# Patient Record
Sex: Female | Born: 1935 | Race: Black or African American | Hispanic: No | Marital: Single | Smoking: Former smoker
Health system: Southern US, Community
[De-identification: ages and names within clinical notes are randomized; demographics above are authoritative.]

## PROBLEM LIST (undated history)

## (undated) DIAGNOSIS — F0390 Unspecified dementia without behavioral disturbance: Secondary | ICD-10-CM

## (undated) DIAGNOSIS — E785 Hyperlipidemia, unspecified: Secondary | ICD-10-CM

## (undated) DIAGNOSIS — C801 Malignant (primary) neoplasm, unspecified: Secondary | ICD-10-CM

## (undated) DIAGNOSIS — K219 Gastro-esophageal reflux disease without esophagitis: Secondary | ICD-10-CM

## (undated) DIAGNOSIS — F329 Major depressive disorder, single episode, unspecified: Secondary | ICD-10-CM

## (undated) DIAGNOSIS — I10 Essential (primary) hypertension: Secondary | ICD-10-CM

## (undated) DIAGNOSIS — I639 Cerebral infarction, unspecified: Secondary | ICD-10-CM

## (undated) DIAGNOSIS — F32A Depression, unspecified: Secondary | ICD-10-CM

## (undated) HISTORY — DX: Gastro-esophageal reflux disease without esophagitis: K21.9

## (undated) HISTORY — DX: Essential (primary) hypertension: I10

## (undated) HISTORY — DX: Depression, unspecified: F32.A

## (undated) HISTORY — DX: Major depressive disorder, single episode, unspecified: F32.9

## (undated) HISTORY — DX: Hyperlipidemia, unspecified: E78.5

---

## 2012-01-03 ENCOUNTER — Inpatient Hospital Stay (HOSPITAL_COMMUNITY)
Admission: EM | Admit: 2012-01-03 | Discharge: 2012-01-12 | DRG: 061 | Disposition: A | Discharge status: Skilled Nursing Facility | Payer: Medicare Other | Attending: Internal Medicine | Admitting: Internal Medicine

## 2012-01-03 ENCOUNTER — Emergency Department (HOSPITAL_COMMUNITY): Payer: Medicare Other

## 2012-01-03 DIAGNOSIS — R29810 Facial weakness: Secondary | ICD-10-CM | POA: Diagnosis present

## 2012-01-03 DIAGNOSIS — G8190 Hemiplegia, unspecified affecting unspecified side: Secondary | ICD-10-CM | POA: Diagnosis present

## 2012-01-03 DIAGNOSIS — S01501A Unspecified open wound of lip, initial encounter: Secondary | ICD-10-CM | POA: Diagnosis present

## 2012-01-03 DIAGNOSIS — Z79899 Other long term (current) drug therapy: Secondary | ICD-10-CM

## 2012-01-03 DIAGNOSIS — F0390 Unspecified dementia without behavioral disturbance: Secondary | ICD-10-CM | POA: Diagnosis present

## 2012-01-03 DIAGNOSIS — D72829 Elevated white blood cell count, unspecified: Secondary | ICD-10-CM | POA: Diagnosis not present

## 2012-01-03 DIAGNOSIS — I639 Cerebral infarction, unspecified: Secondary | ICD-10-CM | POA: Diagnosis present

## 2012-01-03 DIAGNOSIS — Z860100 Personal history of colon polyps, unspecified: Secondary | ICD-10-CM

## 2012-01-03 DIAGNOSIS — Z66 Do not resuscitate: Secondary | ICD-10-CM | POA: Diagnosis present

## 2012-01-03 DIAGNOSIS — R220 Localized swelling, mass and lump, head: Secondary | ICD-10-CM

## 2012-01-03 DIAGNOSIS — I6350 Cerebral infarction due to unspecified occlusion or stenosis of unspecified cerebral artery: Principal | ICD-10-CM | POA: Diagnosis present

## 2012-01-03 DIAGNOSIS — E119 Type 2 diabetes mellitus without complications: Secondary | ICD-10-CM | POA: Diagnosis present

## 2012-01-03 DIAGNOSIS — Z7982 Long term (current) use of aspirin: Secondary | ICD-10-CM

## 2012-01-03 DIAGNOSIS — Z8673 Personal history of transient ischemic attack (TIA), and cerebral infarction without residual deficits: Secondary | ICD-10-CM

## 2012-01-03 DIAGNOSIS — I10 Essential (primary) hypertension: Secondary | ICD-10-CM | POA: Diagnosis present

## 2012-01-03 DIAGNOSIS — N179 Acute kidney failure, unspecified: Secondary | ICD-10-CM | POA: Diagnosis present

## 2012-01-03 DIAGNOSIS — E785 Hyperlipidemia, unspecified: Secondary | ICD-10-CM | POA: Diagnosis present

## 2012-01-03 DIAGNOSIS — R1310 Dysphagia, unspecified: Secondary | ICD-10-CM | POA: Diagnosis present

## 2012-01-03 DIAGNOSIS — E876 Hypokalemia: Secondary | ICD-10-CM | POA: Diagnosis present

## 2012-01-03 DIAGNOSIS — Z7902 Long term (current) use of antithrombotics/antiplatelets: Secondary | ICD-10-CM

## 2012-01-03 DIAGNOSIS — D649 Anemia, unspecified: Secondary | ICD-10-CM | POA: Diagnosis not present

## 2012-01-03 DIAGNOSIS — Z87891 Personal history of nicotine dependence: Secondary | ICD-10-CM

## 2012-01-03 DIAGNOSIS — D62 Acute posthemorrhagic anemia: Secondary | ICD-10-CM | POA: Diagnosis present

## 2012-01-03 DIAGNOSIS — W19XXXA Unspecified fall, initial encounter: Secondary | ICD-10-CM | POA: Diagnosis present

## 2012-01-03 DIAGNOSIS — R4182 Altered mental status, unspecified: Secondary | ICD-10-CM

## 2012-01-03 DIAGNOSIS — E43 Unspecified severe protein-calorie malnutrition: Secondary | ICD-10-CM | POA: Diagnosis present

## 2012-01-03 HISTORY — DX: Unspecified dementia, unspecified severity, without behavioral disturbance, psychotic disturbance, mood disturbance, and anxiety: F03.90

## 2012-01-03 HISTORY — DX: Cerebral infarction, unspecified: I63.9

## 2012-01-03 HISTORY — DX: Malignant (primary) neoplasm, unspecified: C80.1

## 2012-01-03 LAB — URINALYSIS, ROUTINE W REFLEX MICROSCOPIC
Glucose, UA: NEGATIVE mg/dL
Leukocytes, UA: NEGATIVE
Specific Gravity, Urine: 1.022 (ref 1.005–1.030)
pH: 5.5 (ref 5.0–8.0)

## 2012-01-03 LAB — COMPREHENSIVE METABOLIC PANEL
Albumin: 2.5 g/dL — ABNORMAL LOW (ref 3.5–5.2)
Alkaline Phosphatase: 83 U/L (ref 39–117)
BUN: 25 mg/dL — ABNORMAL HIGH (ref 6–23)
Creatinine, Ser: 1.21 mg/dL — ABNORMAL HIGH (ref 0.50–1.10)
Potassium: 2.5 mEq/L — CL (ref 3.5–5.1)
Total Protein: 6 g/dL (ref 6.0–8.3)

## 2012-01-03 LAB — CBC
HCT: 29.4 % — ABNORMAL LOW (ref 36.0–46.0)
MCHC: 33.3 g/dL (ref 30.0–36.0)
RDW: 13.6 % (ref 11.5–15.5)

## 2012-01-03 LAB — URINE MICROSCOPIC-ADD ON

## 2012-01-03 LAB — GLUCOSE, CAPILLARY: Glucose-Capillary: 227 mg/dL — ABNORMAL HIGH (ref 70–99)

## 2012-01-03 MED ORDER — SODIUM CHLORIDE 0.9 % IV SOLN: INTRAVENOUS | Status: DC

## 2012-01-03 MED ORDER — FENTANYL CITRATE 0.05 MG/ML IJ SOLN
25.0000 ug | Freq: Once | INTRAMUSCULAR | Status: DC
  Filled 2012-01-03: qty 2

## 2012-01-03 MED ORDER — ONDANSETRON HCL 4 MG/2ML IJ SOLN: 4.0000 mg | Freq: Three times a day (TID) | INTRAMUSCULAR | Status: DC | PRN

## 2012-01-03 MED ORDER — POTASSIUM CHLORIDE CRYS ER 20 MEQ PO TBCR: 40.0000 meq | EXTENDED_RELEASE_TABLET | Freq: Once | ORAL | Status: DC

## 2012-01-03 MED ORDER — POTASSIUM CHLORIDE 10 MEQ/100ML IV SOLN
10.0000 meq | INTRAVENOUS | Status: AC
  Administered 2012-01-03 – 2012-01-04 (×3): 10 meq via INTRAVENOUS
  Filled 2012-01-03 (×3): qty 100

## 2012-01-03 NOTE — ED Provider Notes (Signed)
History     CSN: [id number]  Arrival date & time 01/03/12  1953   First MD Initiated Contact with Patient 01/03/12 2014      Chief Complaint  Patient presents with  . Fall    (Consider location/radiation/quality/duration/timing/severity/associated sxs/prior treatment) Patient is a 78 y.o. female presenting with fall. The history is provided by the patient, the EMS personnel and a relative.  Fall Incident onset: unknown. The fall occurred in unknown circumstances. She fell from an unknown height. She landed on a hard floor. The volume of blood lost was minimal. The point of impact was the head. The pain is present in the head. She was not ambulatory at the scene. There was entrapment after the fall. Pertinent negatives include no fever, no abdominal pain, no bowel incontinence, no nausea, no vomiting and no headaches. Treatment on scene includes a c-collar and a backboard. She has tried nothing for the symptoms. The treatment provided no relief.    No past medical history on file.  No past surgical history on file.  No family history on file.  History  Substance Use Topics  . Smoking status: Not on file  . Smokeless tobacco: Not on file  . Alcohol Use: Not on file    OB History    No data available      Review of Systems  Constitutional: Negative for fever.  HENT: Negative for neck pain.   Respiratory: Negative for cough and shortness of breath.   Cardiovascular: Negative for chest pain.  Gastrointestinal: Negative for nausea, vomiting, abdominal pain, diarrhea and bowel incontinence.  Neurological: Negative for headaches.  All other systems reviewed and are negative.    Allergies  Review of patient's allergies indicates no known allergies.  Home Medications   Current Outpatient Rx  Name Route Sig Dispense Refill  . ACETAMINOPHEN 325 MG PO TABS Oral Take 650 mg by mouth every 4 (four) hours as needed. For pain    . ASPIRIN EC 325 MG PO TBEC Oral Take 325 mg by  mouth daily.    . ATORVASTATIN CALCIUM 40 MG PO TABS Oral Take 40 mg by mouth daily.    Marland Kitchen CITALOPRAM HYDROBROMIDE 20 MG PO TABS Oral Take 20 mg by mouth every morning.    Marland Kitchen CLOPIDOGREL BISULFATE 75 MG PO TABS Oral Take 75 mg by mouth daily.    Marland Kitchen FERROUS SULFATE 325 (65 FE) MG PO TABS Oral Take 325 mg by mouth 2 (two) times daily.    Marland Kitchen HYDRALAZINE HCL 25 MG PO TABS Oral Take 25 mg by mouth 3 (three) times daily.    Marland Kitchen BACID PO TABS Oral Take 2 tablets by mouth every 12 (twelve) hours.    Marland Kitchen METOPROLOL TARTRATE 100 MG PO TABS Oral Take 100 mg by mouth daily.    Marland Kitchen OMEPRAZOLE 40 MG PO CPDR Oral Take 40 mg by mouth daily.    Marland Kitchen CALCIUM POLYCARBOPHIL 625 MG PO TABS Oral Take 625 mg by mouth 3 (three) times daily.    Marland Kitchen POLYETHYLENE GLYCOL 3350 PO PACK Oral Take 17 g by mouth every other day.    . QUETIAPINE FUMARATE 25 MG PO TABS Oral Take 75 mg by mouth at bedtime.    Marland Kitchen RIVASTIGMINE TARTRATE 1.5 MG PO CAPS Oral Take 1.5 mg by mouth 2 (two) times daily.    . SENNA 8.6 MG PO TABS Oral Take 1 tablet by mouth daily.      BP 180/89  Pulse 100  Temp 97.9  F (36.6 C) (Axillary)  Resp 21  SpO2 97%  Physical Exam  Nursing note and vitals reviewed. Constitutional: She is oriented to person, place, and time. She appears well-developed and well-nourished. No distress.  HENT:  Head: Normocephalic.       Laceration to left upper inside of lip, hemostatic  Eyes: EOM are normal. Pupils are equal, round, and reactive to light.  Neck: Normal range of motion.  Cardiovascular: Normal rate and normal heart sounds.   Pulmonary/Chest: Effort normal and breath sounds normal. No respiratory distress.  Abdominal: Soft. She exhibits no distension. There is no tenderness.  Musculoskeletal: Normal range of motion.  Neurological: She is alert and oriented to person, place, and time. No cranial nerve deficit. She exhibits normal muscle tone. Coordination normal.       Moving all extremities equally; follows commands on  both sides  Skin: Skin is warm and dry.    ED Course  Procedures (including critical care time)  Labs Reviewed  CBC - Abnormal; Notable for the following:    RBC 3.50 (*)     Hemoglobin 9.8 (*)     HCT 29.4 (*)     All other components within normal limits  COMPREHENSIVE METABOLIC PANEL - Abnormal; Notable for the following:    Potassium 2.5 (*)     Glucose, Bld 214 (*)     BUN 25 (*)     Creatinine, Ser 1.21 (*)     Albumin 2.5 (*)     Total Bilirubin 0.2 (*)     GFR calc non Af Amer 38 (*)     GFR calc Af Amer 44 (*)     All other components within normal limits  URINALYSIS, ROUTINE W REFLEX MICROSCOPIC - Abnormal; Notable for the following:    Ketones, ur 15 (*)     Protein, ur 30 (*)     All other components within normal limits  GLUCOSE, CAPILLARY - Abnormal; Notable for the following:    Glucose-Capillary 227 (*)     All other components within normal limits  URINE MICROSCOPIC-ADD ON - Abnormal; Notable for the following:    Bacteria, UA FEW (*)     Casts HYALINE CASTS (*)  GRANULAR CAST   All other components within normal limits  PROTIME-INR  PLATELET COUNT   Ct Head Wo Contrast  01/04/2012  *RADIOLOGY REPORT*  Clinical Data: Left facial droop  CT HEAD WITHOUT CONTRAST  Technique:  Contiguous axial images were obtained from the base of the skull through the vertex without contrast.  Comparison:   the previous day's study  Findings: Atherosclerotic and physiologic intracranial calcifications.  Left frontal scalp soft tissue swelling again noted.  Patient motion degrades many images requiring repeat portions of the scan. Diffuse parenchymal atrophy. Patchy areas of hypoattenuation in deep and periventricular white matter bilaterally. Negative for acute intracranial hemorrhage, mass lesion, acute infarction, midline shift, or mass-effect. Acute infarct may be inapparent on noncontrast CT. Ventricles and sulci symmetric. Bone windows demonstrate no focal lesion.  IMPRESSION:   1. Negative for bleed or other acute intracranial process.  2. Atrophy and nonspecific white matter changes.   Original Report Authenticated By: Osa Craver, M.D.    Ct Head Wo Contrast  01/03/2012  *RADIOLOGY REPORT*  Clinical Data:  Found down the, altered mental status, hypertension  CT HEAD WITHOUT CONTRAST CT MAXILLOFACIAL WITHOUT CONTRAST CT CERVICAL SPINE WITHOUT CONTRAST  Technique:  Multidetector CT imaging of the head, cervical spine, and maxillofacial  structures were performed using the standard protocol without intravenous contrast. Multiplanar CT image reconstructions of the cervical spine and maxillofacial structures were also generated.  Comparison:   None  CT HEAD  Findings: Right frontal scalp soft tissue swelling. Atherosclerotic and physiologic intracranial calcifications. Diffuse parenchymal atrophy. Patchy areas of hypoattenuation in deep and periventricular white matter bilaterally. Negative for acute intracranial hemorrhage, mass lesion, acute infarction, midline shift, or mass-effect. Acute infarct may be inapparent on noncontrast CT. Ventricles and sulci symmetric. Bone windows demonstrate no focal lesion.  IMPRESSION:  1. Negative for bleed or other acute intracranial process.  2. Atrophy and nonspecific white matter changes  CT MAXILLOFACIAL  Findings:  Layering debris in the right frontal sinus.  Remainder of paranasal sinuses are normally developed and well aerated. Nasal septum midline.  Temporomandibular joints seated bilaterally. Mandible intact.  Zygomatic arches intact.  Orbits and globes unremarkable.  Negative for fracture.  Left frontal scalp soft tissue swelling. The patient is edentulous.  IMPRESSION:  1.  Negative for fracture or other acute bony abnormality.  CT CERVICAL SPINE  Findings:   Normal alignment.  No prevertebral soft tissue swelling.  Facets seated.  There is mild narrowing of the C4-5, C5- 6, and C 7T1 interspaces.  Small end plate spurs are noted  at all levels C3-T4.  Degenerative changes at the atlantoaxial articulation.  Negative for fracture.  Bilateral calcified carotid bifurcation plaque.  Remainder visualized paraspinal soft tissues unremarkable.  IMPRESSION: 1.  Negative for fracture or other acute bony abnormality. 2.  Multilevel degenerative changes as enumerated above. 3.  Bilateral carotid bifurcation plaque.   Original Report Authenticated By: Osa Craver, M.D.    Ct Cervical Spine Wo Contrast  01/03/2012  *RADIOLOGY REPORT*  Clinical Data:  Found down the, altered mental status, hypertension  CT HEAD WITHOUT CONTRAST CT MAXILLOFACIAL WITHOUT CONTRAST CT CERVICAL SPINE WITHOUT CONTRAST  Technique:  Multidetector CT imaging of the head, cervical spine, and maxillofacial structures were performed using the standard protocol without intravenous contrast. Multiplanar CT image reconstructions of the cervical spine and maxillofacial structures were also generated.  Comparison:   None  CT HEAD  Findings: Right frontal scalp soft tissue swelling. Atherosclerotic and physiologic intracranial calcifications. Diffuse parenchymal atrophy. Patchy areas of hypoattenuation in deep and periventricular white matter bilaterally. Negative for acute intracranial hemorrhage, mass lesion, acute infarction, midline shift, or mass-effect. Acute infarct may be inapparent on noncontrast CT. Ventricles and sulci symmetric. Bone windows demonstrate no focal lesion.  IMPRESSION:  1. Negative for bleed or other acute intracranial process.  2. Atrophy and nonspecific white matter changes  CT MAXILLOFACIAL  Findings:  Layering debris in the right frontal sinus.  Remainder of paranasal sinuses are normally developed and well aerated. Nasal septum midline.  Temporomandibular joints seated bilaterally. Mandible intact.  Zygomatic arches intact.  Orbits and globes unremarkable.  Negative for fracture.  Left frontal scalp soft tissue swelling. The patient is edentulous.   IMPRESSION:  1.  Negative for fracture or other acute bony abnormality.  CT CERVICAL SPINE  Findings:   Normal alignment.  No prevertebral soft tissue swelling.  Facets seated.  There is mild narrowing of the C4-5, C5- 6, and C 7T1 interspaces.  Small end plate spurs are noted at all levels C3-T4.  Degenerative changes at the atlantoaxial articulation.  Negative for fracture.  Bilateral calcified carotid bifurcation plaque.  Remainder visualized paraspinal soft tissues unremarkable.  IMPRESSION: 1.  Negative for fracture or other acute bony abnormality.  2.  Multilevel degenerative changes as enumerated above. 3.  Bilateral carotid bifurcation plaque.   Original Report Authenticated By: Osa Craver, M.D.    Ct Maxillofacial Wo Cm  01/03/2012  *RADIOLOGY REPORT*  Clinical Data:  Found down the, altered mental status, hypertension  CT HEAD WITHOUT CONTRAST CT MAXILLOFACIAL WITHOUT CONTRAST CT CERVICAL SPINE WITHOUT CONTRAST  Technique:  Multidetector CT imaging of the head, cervical spine, and maxillofacial structures were performed using the standard protocol without intravenous contrast. Multiplanar CT image reconstructions of the cervical spine and maxillofacial structures were also generated.  Comparison:   None  CT HEAD  Findings: Right frontal scalp soft tissue swelling. Atherosclerotic and physiologic intracranial calcifications. Diffuse parenchymal atrophy. Patchy areas of hypoattenuation in deep and periventricular white matter bilaterally. Negative for acute intracranial hemorrhage, mass lesion, acute infarction, midline shift, or mass-effect. Acute infarct may be inapparent on noncontrast CT. Ventricles and sulci symmetric. Bone windows demonstrate no focal lesion.  IMPRESSION:  1. Negative for bleed or other acute intracranial process.  2. Atrophy and nonspecific white matter changes  CT MAXILLOFACIAL  Findings:  Layering debris in the right frontal sinus.  Remainder of paranasal sinuses are  normally developed and well aerated. Nasal septum midline.  Temporomandibular joints seated bilaterally. Mandible intact.  Zygomatic arches intact.  Orbits and globes unremarkable.  Negative for fracture.  Left frontal scalp soft tissue swelling. The patient is edentulous.  IMPRESSION:  1.  Negative for fracture or other acute bony abnormality.  CT CERVICAL SPINE  Findings:   Normal alignment.  No prevertebral soft tissue swelling.  Facets seated.  There is mild narrowing of the C4-5, C5- 6, and C 7T1 interspaces.  Small end plate spurs are noted at all levels C3-T4.  Degenerative changes at the atlantoaxial articulation.  Negative for fracture.  Bilateral calcified carotid bifurcation plaque.  Remainder visualized paraspinal soft tissues unremarkable.  IMPRESSION: 1.  Negative for fracture or other acute bony abnormality. 2.  Multilevel degenerative changes as enumerated above. 3.  Bilateral carotid bifurcation plaque.   Original Report Authenticated By: Thora Lance III, M.D.      1. Hypokalemia   2. Altered mental status       MDM  Pt found face down by wheelchair at nursing home. Patient just recently move into this home and staff does not know her baseline. Pt does not appear to be on coumadin. Pt with obvious facial trauma so will scan head, neck and face. Do not feel oral laceration needs to be repaired. Will also get labs as unknown history.   8:27 PM I spoke to Mrss. Derryl Harbor, the patient's daughter-in-law. Pt is DNR. She has been having "mini-strokes" the last several weeks that are similar to this presentation. As of Friday (yesterday) patient was pushing herself around in a wheelchair with her feet and talking normally. She also mentions that the patient has been having "hallucinations" of her son being a small child. This could actually be her dementia instead of hallucinations, but unsure. Will procede with current workup and family will come to ED.  Workup unremarkable but patient  still appears altered from baseline. Are slowing replacing potassium. Will admit to medicine.  12:00 PM Pt was being assessed by admitting doctor and was noted to have left side neglect and weakness. This was a change from initial assessment. Nurse last noted patient moving both sides of body at 10 PM. Code Stroke activated.  1:08 AM Neurology has decided to give tPA to  patient. Pt to be admitted to ICU afterward.  Daleen Bo, MD 01/04/12 [phone]

## 2012-01-03 NOTE — ED Notes (Signed)
Per EMS, pt was found faced down. It is unknown how long patient has been down. Dentures broke in mouth. Currently dried blood on face and in mouth. Mouth cleaned out. Altered mental status but pt has hx of dementia. Unknown if AMS is baseline.  EMS Vital Signs: SBP: 200 palpated, HR 120, Resp 16, CBG 170. No IV or medications given.

## 2012-01-04 ENCOUNTER — Observation Stay (HOSPITAL_COMMUNITY): Payer: Medicare Other

## 2012-01-04 ENCOUNTER — Encounter (HOSPITAL_COMMUNITY): Payer: Self-pay | Admitting: *Deleted

## 2012-01-04 ENCOUNTER — Inpatient Hospital Stay (HOSPITAL_COMMUNITY): Payer: Medicare Other

## 2012-01-04 DIAGNOSIS — N179 Acute kidney failure, unspecified: Secondary | ICD-10-CM

## 2012-01-04 DIAGNOSIS — I639 Cerebral infarction, unspecified: Secondary | ICD-10-CM | POA: Diagnosis present

## 2012-01-04 DIAGNOSIS — E876 Hypokalemia: Secondary | ICD-10-CM

## 2012-01-04 DIAGNOSIS — R4182 Altered mental status, unspecified: Secondary | ICD-10-CM

## 2012-01-04 DIAGNOSIS — I6350 Cerebral infarction due to unspecified occlusion or stenosis of unspecified cerebral artery: Principal | ICD-10-CM

## 2012-01-04 LAB — MRSA PCR SCREENING: MRSA by PCR: NEGATIVE

## 2012-01-04 LAB — CBC
Hemoglobin: 8.6 g/dL — ABNORMAL LOW (ref 12.0–15.0)
MCH: 27.1 pg (ref 26.0–34.0)
MCHC: 32.2 g/dL (ref 30.0–36.0)
Platelets: 236 10*3/uL (ref 150–400)
RBC: 3.17 MIL/uL — ABNORMAL LOW (ref 3.87–5.11)

## 2012-01-04 LAB — GLUCOSE, CAPILLARY: Glucose-Capillary: 130 mg/dL — ABNORMAL HIGH (ref 70–99)

## 2012-01-04 LAB — PROTIME-INR
INR: 1.13 (ref 0.00–1.49)
Prothrombin Time: 14.3 seconds (ref 11.6–15.2)

## 2012-01-04 LAB — BASIC METABOLIC PANEL
BUN: 23 mg/dL (ref 6–23)
Calcium: 9.2 mg/dL (ref 8.4–10.5)
Chloride: 111 mEq/L (ref 96–112)
Creatinine, Ser: 1.07 mg/dL (ref 0.50–1.10)
GFR calc Af Amer: 48 mL/min — ABNORMAL LOW (ref >90)
GFR calc Af Amer: 51 mL/min — ABNORMAL LOW (ref >90)
GFR calc non Af Amer: 41 mL/min — ABNORMAL LOW (ref >90)
Glucose, Bld: 190 mg/dL — ABNORMAL HIGH (ref 70–99)
Potassium: 3.1 mEq/L — ABNORMAL LOW (ref 3.5–5.1)
Sodium: 139 mEq/L (ref 135–145)

## 2012-01-04 LAB — PLATELET COUNT: Platelets: 264 10*3/uL (ref 150–400)

## 2012-01-04 LAB — LIPID PANEL
Cholesterol: 149 mg/dL (ref 0–200)
Triglycerides: 60 mg/dL (ref <150)
VLDL: 12 mg/dL (ref 0–40)

## 2012-01-04 MED ORDER — LABETALOL HCL 5 MG/ML IV SOLN
10.0000 mg | Freq: Once | INTRAVENOUS | Status: AC
  Administered 2012-01-04: 10 mg via INTRAVENOUS

## 2012-01-04 MED ORDER — SODIUM CHLORIDE 0.9 % IV SOLN
INTRAVENOUS | Status: DC
  Administered 2012-01-04: 04:00:00 via INTRAVENOUS

## 2012-01-04 MED ORDER — LABETALOL HCL 5 MG/ML IV SOLN
INTRAVENOUS | Status: AC
  Administered 2012-01-04: 10 mg via INTRAVENOUS
  Filled 2012-01-04: qty 4

## 2012-01-04 MED ORDER — ONDANSETRON HCL 4 MG/2ML IJ SOLN: 4.0000 mg | Freq: Four times a day (QID) | INTRAMUSCULAR | Status: DC | PRN

## 2012-01-04 MED ORDER — PANTOPRAZOLE SODIUM 40 MG IV SOLR
40.0000 mg | Freq: Every day | INTRAVENOUS | Status: DC
  Administered 2012-01-04 – 2012-01-07 (×4): 40 mg via INTRAVENOUS
  Filled 2012-01-04 (×6): qty 40

## 2012-01-04 MED ORDER — POTASSIUM CHLORIDE 10 MEQ/100ML IV SOLN
10.0000 meq | INTRAVENOUS | Status: AC
  Administered 2012-01-04 (×4): 10 meq via INTRAVENOUS
  Filled 2012-01-04 (×4): qty 100

## 2012-01-04 MED ORDER — BIOTENE DRY MOUTH MT LIQD
15.0000 mL | Freq: Two times a day (BID) | OROMUCOSAL | Status: DC
  Administered 2012-01-04 – 2012-01-12 (×16): 15 mL via OROMUCOSAL

## 2012-01-04 MED ORDER — ALTEPLASE (STROKE) FULL DOSE INFUSION
0.9000 mg/kg | Freq: Once | INTRAVENOUS | Status: AC
  Administered 2012-01-04: 54 mg via INTRAVENOUS
  Filled 2012-01-04: qty 54

## 2012-01-04 MED ORDER — LABETALOL HCL 5 MG/ML IV SOLN
10.0000 mg | INTRAVENOUS | Status: DC | PRN
  Administered 2012-01-05 (×2): 10 mg via INTRAVENOUS
  Filled 2012-01-04 (×3): qty 4

## 2012-01-04 MED ORDER — ACETAMINOPHEN 325 MG PO TABS: 650.0000 mg | ORAL_TABLET | ORAL | Status: DC | PRN

## 2012-01-04 MED ORDER — POTASSIUM CHLORIDE IN NACL 20-0.9 MEQ/L-% IV SOLN
INTRAVENOUS | Status: DC
  Administered 2012-01-04: 75 mL/h via INTRAVENOUS
  Administered 2012-01-04: 11:00:00 via INTRAVENOUS
  Filled 2012-01-04 (×3): qty 1000

## 2012-01-04 MED ORDER — CHLORHEXIDINE GLUCONATE 0.12 % MT SOLN
15.0000 mL | Freq: Two times a day (BID) | OROMUCOSAL | Status: DC
  Administered 2012-01-04 – 2012-01-06 (×4): 15 mL via OROMUCOSAL
  Filled 2012-01-04 (×8): qty 15

## 2012-01-04 MED ORDER — INSULIN ASPART 100 UNIT/ML ~~LOC~~ SOLN
1.0000 [IU] | SUBCUTANEOUS | Status: DC
  Administered 2012-01-04: 2 [IU] via SUBCUTANEOUS
  Administered 2012-01-04 – 2012-01-06 (×2): 1 [IU] via SUBCUTANEOUS

## 2012-01-04 MED ORDER — ACETAMINOPHEN 650 MG RE SUPP
650.0000 mg | RECTAL | Status: DC | PRN
  Administered 2012-01-06: 650 mg via RECTAL
  Filled 2012-01-04: qty 1

## 2012-01-04 MED ORDER — HALOPERIDOL LACTATE 5 MG/ML IJ SOLN
2.0000 mg | Freq: Four times a day (QID) | INTRAMUSCULAR | Status: DC | PRN
  Administered 2012-01-04: 2 mg via INTRAVENOUS
  Filled 2012-01-04: qty 1

## 2012-01-04 NOTE — ED Notes (Signed)
Consulting civil engineer and EDP notified of bleed. Dr Julian Reil stated that he is not over pt if pt is on tpa. Speaking with family.  Family is upset.  Neuro paged.

## 2012-01-04 NOTE — ED Notes (Addendum)
Critical care at bedside. Eye swollen shut. Hematoma over left eye and chest. EDP, Neurologist, and Critical care are all aware of bleeding and hematoma. Neurologist okay elevating HOB. Pt continues to be suctioned continuously.

## 2012-01-04 NOTE — Progress Notes (Signed)
Called by nursing for acute neurological worsening. The patient's NIHSS per nursing is now 22. Her right arm now exhibits increasing weakness and she is more lethargic. Her pupils are equal and reactive.   STAT head CT ordered. Nursing to call me upon completion of the CT scan.   Electronically signed: Dr. Caryl Pina

## 2012-01-04 NOTE — ED Notes (Signed)
Neurologist made aware that bleeding continues

## 2012-01-04 NOTE — Progress Notes (Signed)
Stroke Team Progress Note  HISTORY Amy Martin is an 78 y.o. female who initially presented to [facility] ED this evening after a fall. She was found face down and per EMS it was unknown for how long she had been down. Dentures had broken in her mouth and she had dried blood on her face and in her mouth. Noted to have AMS on arrival, but with underlying hx of dementia. Blood glucose was 170. SBP initially 200 and HR 120. BP was controlled with IV labetalol. On initial ED evaluation, the patient was able to move her left and right sides. Her initial CT showed no acute findings. Initially hypokalemic, which was treated in the ED. She was last seen to be at her presenting neurologic baseline in the ED at 10 PM, able to move all 4 extremities with symmetric facies. Later during her ED evaluation, she suddenly developed dense left hemiparesis and left facial droop. Code stroke was called.  A second STAT CT was obtained. The images are compromised by artifact, but no hemorrhage or other acute findings are seen. I have reviewed the images and agree with the radiology report.  All tPA exclusion criteria were reviewed with family over the telephone. The patient had a confusional spell 2 weeks ago that per report was classified as a stroke; the history provided by family consists only of AMS and hallucinations, and the event is therefore most accurately classifiable as not having been a stroke in my clinical opinion. She also has a slowly bleeding intestinal polyp per family, not requiring transfusions; for the purposes of tPA exclusion criteria, I feel that it is in the best interests of the patient to classify this as not being a major bleed. Similarly, her lip laceration from the fall, although bleeding slowly, is not a contraindication to tPA.    SUBJECTIVE   OBJECTIVE Most recent Vital Signs: Temp: 98.3 F (36.8 C) (10/06 0345) Temp src: Oral (10/06 0345) BP: 162/63 mmHg (10/06 0930) Pulse Rate: 42  (10/06  0930) Respiratory Rate: 20 O2 Saturation: 99%  CBG (last 3)  Basename 01/03/12 2030  GLUCAP 227*   Intake/Output from previous day: 10/05 0701 - 10/06 0700 In: 150 [I.V.:150] Out: -   IV Fluid Intake:     . sodium chloride 50 mL/hr at 01/04/12 0418   Medications    . alteplase  0.9 mg/kg (Order-Specific) Intravenous Once  . insulin aspart  1-3 Units Subcutaneous Q4H  . labetalol  10 mg Intravenous Once  . pantoprazole (PROTONIX) IV  40 mg Intravenous QHS  . potassium chloride  10 mEq Intravenous Q1 Hr x 3  . potassium chloride  10 mEq Intravenous Q1 Hr x 4  . DISCONTD: sodium chloride   Intravenous STAT  . DISCONTD: fentaNYL  25 mcg Intravenous Once  . DISCONTD: potassium chloride  40 mEq Oral Once  PRN acetaminophen, acetaminophen, labetalol, ondansetron (ZOFRAN) IV, DISCONTD: ondansetron (ZOFRAN) IV  Diet:  NPO  Activity: Bedrest DVT Prophylaxis:  SCD  Significant Diagnostic Studies: CBC    Component Value Date/Time   WBC 10.6* 01/04/2012 0705   RBC 3.17* 01/04/2012 0705   HGB 8.6* 01/04/2012 0705   HCT 26.7* 01/04/2012 0705   PLT 236 01/04/2012 0705   MCV 84.2 01/04/2012 0705   MCH 27.1 01/04/2012 0705   MCHC 32.2 01/04/2012 0705   RDW 13.6 01/04/2012 0705   CMP    Component Value Date/Time   NA 139 01/04/2012 0705   K 3.1* 01/04/2012 1610  CL 105 01/04/2012 0705   CO2 25 01/04/2012 0705   GLUCOSE 190* 01/04/2012 0705   BUN 25* 01/04/2012 0705   CREATININE 1.14* 01/04/2012 0705   CALCIUM 9.2 01/04/2012 0705   PROT 6.0 01/03/2012 2023   ALBUMIN 2.5* 01/03/2012 2023   AST 12 01/03/2012 2023   ALT 7 01/03/2012 2023   ALKPHOS 83 01/03/2012 2023   BILITOT 0.2* 01/03/2012 2023   GFRNONAA 41* 01/04/2012 0705   GFRAA 48* 01/04/2012 0705   COAGS Lab Results  Component Value Date   INR 1.13 01/04/2012   Lipid Panel    Component Value Date/Time   CHOL 149 01/04/2012 0705   TRIG 60 01/04/2012 0705   HDL 67 01/04/2012 0705   CHOLHDL 2.2 01/04/2012 0705   VLDL 12 01/04/2012  0705   LDLCALC 70 01/04/2012 0705   HgbA1C  No results found for this basename: HGBA1C   Urine Drug Screen  No results found for this basename: labopia, cocainscrnur, labbenz, amphetmu, thcu, labbarb    Alcohol Level No results found for this basename: eth     Results for orders placed during the hospital encounter of 01/03/12 (from the past 24 hour(s))  CBC     Status: Abnormal   Collection Time   01/03/12  8:23 PM      Component Value Range   WBC 8.3  4.0 - 10.5 K/uL   RBC 3.50 (*) 3.87 - 5.11 MIL/uL   Hemoglobin 9.8 (*) 12.0 - 15.0 g/dL   HCT 98.1 (*) 19.1 - 47.8 %   MCV 84.0  78.0 - 100.0 fL   MCH 28.0  26.0 - 34.0 pg   MCHC 33.3  30.0 - 36.0 g/dL   RDW 29.5  62.1 - 30.8 %   Platelets 235  150 - 400 K/uL  COMPREHENSIVE METABOLIC PANEL     Status: Abnormal   Collection Time   01/03/12  8:23 PM      Component Value Range   Sodium 136  135 - 145 mEq/L   Potassium 2.5 (*) 3.5 - 5.1 mEq/L   Chloride 103  96 - 112 mEq/L   CO2 22  19 - 32 mEq/L   Glucose, Bld 214 (*) 70 - 99 mg/dL   BUN 25 (*) 6 - 23 mg/dL   Creatinine, Ser 6.57 (*) 0.50 - 1.10 mg/dL   Calcium 9.6  8.4 - 84.6 mg/dL   Total Protein 6.0  6.0 - 8.3 g/dL   Albumin 2.5 (*) 3.5 - 5.2 g/dL   AST 12  0 - 37 U/L   ALT 7  0 - 35 U/L   Alkaline Phosphatase 83  39 - 117 U/L   Total Bilirubin 0.2 (*) 0.3 - 1.2 mg/dL   GFR calc non Af Amer 38 (*) >90 mL/min   GFR calc Af Amer 44 (*) >90 mL/min  GLUCOSE, CAPILLARY     Status: Abnormal   Collection Time   01/03/12  8:30 PM      Component Value Range   Glucose-Capillary 227 (*) 70 - 99 mg/dL  URINALYSIS, ROUTINE W REFLEX MICROSCOPIC     Status: Abnormal   Collection Time   01/03/12  9:03 PM      Component Value Range   Color, Urine YELLOW  YELLOW   APPearance CLEAR  CLEAR   Specific Gravity, Urine 1.022  1.005 - 1.030   pH 5.5  5.0 - 8.0   Glucose, UA NEGATIVE  NEGATIVE mg/dL   Hgb urine dipstick  NEGATIVE  NEGATIVE   Bilirubin Urine NEGATIVE  NEGATIVE   Ketones,  ur 15 (*) NEGATIVE mg/dL   Protein, ur 30 (*) NEGATIVE mg/dL   Urobilinogen, UA 1.0  0.0 - 1.0 mg/dL   Nitrite NEGATIVE  NEGATIVE   Leukocytes, UA NEGATIVE  NEGATIVE  URINE MICROSCOPIC-ADD ON     Status: Abnormal   Collection Time   01/03/12  9:03 PM      Component Value Range   Squamous Epithelial / LPF RARE  RARE   WBC, UA 0-2  <3 WBC/hpf   RBC / HPF 3-6  <3 RBC/hpf   Bacteria, UA FEW (*) RARE   Casts HYALINE CASTS (*) NEGATIVE  PROTIME-INR     Status: Normal   Collection Time   01/04/12 12:33 AM      Component Value Range   Prothrombin Time 14.3  11.6 - 15.2 seconds   INR 1.13  0.00 - 1.49  PLATELET COUNT     Status: Normal   Collection Time   01/04/12 12:33 AM      Component Value Range   Platelets 264  150 - 400 K/uL  MRSA PCR SCREENING     Status: Normal   Collection Time   01/04/12  3:44 AM      Component Value Range   MRSA by PCR NEGATIVE  NEGATIVE  CBC     Status: Abnormal   Collection Time   01/04/12  7:05 AM      Component Value Range   WBC 10.6 (*) 4.0 - 10.5 K/uL   RBC 3.17 (*) 3.87 - 5.11 MIL/uL   Hemoglobin 8.6 (*) 12.0 - 15.0 g/dL   HCT 16.1 (*) 09.6 - 04.5 %   MCV 84.2  78.0 - 100.0 fL   MCH 27.1  26.0 - 34.0 pg   MCHC 32.2  30.0 - 36.0 g/dL   RDW 40.9  81.1 - 91.4 %   Platelets 236  150 - 400 K/uL  BASIC METABOLIC PANEL     Status: Abnormal   Collection Time   01/04/12  7:05 AM      Component Value Range   Sodium 139  135 - 145 mEq/L   Potassium 3.1 (*) 3.5 - 5.1 mEq/L   Chloride 105  96 - 112 mEq/L   CO2 25  19 - 32 mEq/L   Glucose, Bld 190 (*) 70 - 99 mg/dL   BUN 25 (*) 6 - 23 mg/dL   Creatinine, Ser 7.82 (*) 0.50 - 1.10 mg/dL   Calcium 9.2  8.4 - 95.6 mg/dL   GFR calc non Af Amer 41 (*) >90 mL/min   GFR calc Af Amer 48 (*) >90 mL/min  LIPID PANEL     Status: Normal   Collection Time   01/04/12  7:05 AM      Component Value Range   Cholesterol 149  0 - 200 mg/dL   Triglycerides 60  <213 mg/dL   HDL 67  >08 mg/dL   Total CHOL/HDL Ratio 2.2      VLDL 12  0 - 40 mg/dL   LDL Cholesterol 70  0 - 99 mg/dL    Ct Head Wo Contrast  01/04/2012  *RADIOLOGY REPORT*  Clinical Data: New right-sided weakness.  Increased lethargy.  CT HEAD WITHOUT CONTRAST  Technique:  Contiguous axial images were obtained from the base of the skull through the vertex without contrast.  Comparison: 01/04/2012 at the 12:13 a.m.  Findings: There is a small amount of  intraventricular hemorrhage layering in the occipital horn of the left lateral ventricle on images 13-16 of series 2.  Stable white matter hypodensities favoring chronic ischemic microvascular white matter disease noted. Faint calcifications suspected in the right globus pallidus nucleus.  New small left falcine subdural hematoma noted anteriorly on images 9-15 of series 2.  Left forehead scalp hematoma noted.  This extends into the left periorbital soft tissues and left facial tissues.  I do not observe an acute CVA.  No mass lesion noted.  IMPRESSION:  1.  Small amount of intraventricular hemorrhage in the occipital horn of the left lateral ventricle.  No hydrocephalus. 2.  Interval formation of a small anterior falcine subdural hematoma. 3.  Left facial and forehead scalp hematoma.  I discussed these findings by telephone with Dr. Onalee Hua at 7:14 a.m. on 01/04/2012.   Original Report Authenticated By: Dellia Cloud, M.D.    Ct Head Wo Contrast  01/04/2012  *RADIOLOGY REPORT*  Clinical Data: Left facial droop  CT HEAD WITHOUT CONTRAST  Technique:  Contiguous axial images were obtained from the base of the skull through the vertex without contrast.  Comparison:   the previous day's study  Findings: Atherosclerotic and physiologic intracranial calcifications.  Left frontal scalp soft tissue swelling again noted.  Patient motion degrades many images requiring repeat portions of the scan. Diffuse parenchymal atrophy. Patchy areas of hypoattenuation in deep and periventricular white matter bilaterally.  Negative for acute intracranial hemorrhage, mass lesion, acute infarction, midline shift, or mass-effect. Acute infarct may be inapparent on noncontrast CT. Ventricles and sulci symmetric. Bone windows demonstrate no focal lesion.  IMPRESSION:  1. Negative for bleed or other acute intracranial process.  2. Atrophy and nonspecific white matter changes.   Original Report Authenticated By: Osa Craver, M.D.    Ct Head Wo Contrast  01/03/2012  *RADIOLOGY REPORT*  Clinical Data:  Found down the, altered mental status, hypertension  CT HEAD WITHOUT CONTRAST CT MAXILLOFACIAL WITHOUT CONTRAST CT CERVICAL SPINE WITHOUT CONTRAST  Technique:  Multidetector CT imaging of the head, cervical spine, and maxillofacial structures were performed using the standard protocol without intravenous contrast. Multiplanar CT image reconstructions of the cervical spine and maxillofacial structures were also generated.  Comparison:   None  CT HEAD  Findings: Right frontal scalp soft tissue swelling. Atherosclerotic and physiologic intracranial calcifications. Diffuse parenchymal atrophy. Patchy areas of hypoattenuation in deep and periventricular white matter bilaterally. Negative for acute intracranial hemorrhage, mass lesion, acute infarction, midline shift, or mass-effect. Acute infarct may be inapparent on noncontrast CT. Ventricles and sulci symmetric. Bone windows demonstrate no focal lesion.  IMPRESSION:  1. Negative for bleed or other acute intracranial process.  2. Atrophy and nonspecific white matter changes  CT MAXILLOFACIAL  Findings:  Layering debris in the right frontal sinus.  Remainder of paranasal sinuses are normally developed and well aerated. Nasal septum midline.  Temporomandibular joints seated bilaterally. Mandible intact.  Zygomatic arches intact.  Orbits and globes unremarkable.  Negative for fracture.  Left frontal scalp soft tissue swelling. The patient is edentulous.  IMPRESSION:  1.  Negative for  fracture or other acute bony abnormality.  CT CERVICAL SPINE  Findings:   Normal alignment.  No prevertebral soft tissue swelling.  Facets seated.  There is mild narrowing of the C4-5, C5- 6, and C 7T1 interspaces.  Small end plate spurs are noted at all levels C3-T4.  Degenerative changes at the atlantoaxial articulation.  Negative for fracture.  Bilateral calcified carotid  bifurcation plaque.  Remainder visualized paraspinal soft tissues unremarkable.  IMPRESSION: 1.  Negative for fracture or other acute bony abnormality. 2.  Multilevel degenerative changes as enumerated above. 3.  Bilateral carotid bifurcation plaque.   Original Report Authenticated By: Osa Craver, M.D.    Ct Cervical Spine Wo Contrast  01/03/2012  *RADIOLOGY REPORT*  Clinical Data:  Found down the, altered mental status, hypertension  CT HEAD WITHOUT CONTRAST CT MAXILLOFACIAL WITHOUT CONTRAST CT CERVICAL SPINE WITHOUT CONTRAST  Technique:  Multidetector CT imaging of the head, cervical spine, and maxillofacial structures were performed using the standard protocol without intravenous contrast. Multiplanar CT image reconstructions of the cervical spine and maxillofacial structures were also generated.  Comparison:   None  CT HEAD  Findings: Right frontal scalp soft tissue swelling. Atherosclerotic and physiologic intracranial calcifications. Diffuse parenchymal atrophy. Patchy areas of hypoattenuation in deep and periventricular white matter bilaterally. Negative for acute intracranial hemorrhage, mass lesion, acute infarction, midline shift, or mass-effect. Acute infarct may be inapparent on noncontrast CT. Ventricles and sulci symmetric. Bone windows demonstrate no focal lesion.  IMPRESSION:  1. Negative for bleed or other acute intracranial process.  2. Atrophy and nonspecific white matter changes  CT MAXILLOFACIAL  Findings:  Layering debris in the right frontal sinus.  Remainder of paranasal sinuses are normally developed and  well aerated. Nasal septum midline.  Temporomandibular joints seated bilaterally. Mandible intact.  Zygomatic arches intact.  Orbits and globes unremarkable.  Negative for fracture.  Left frontal scalp soft tissue swelling. The patient is edentulous.  IMPRESSION:  1.  Negative for fracture or other acute bony abnormality.  CT CERVICAL SPINE  Findings:   Normal alignment.  No prevertebral soft tissue swelling.  Facets seated.  There is mild narrowing of the C4-5, C5- 6, and C 7T1 interspaces.  Small end plate spurs are noted at all levels C3-T4.  Degenerative changes at the atlantoaxial articulation.  Negative for fracture.  Bilateral calcified carotid bifurcation plaque.  Remainder visualized paraspinal soft tissues unremarkable.  IMPRESSION: 1.  Negative for fracture or other acute bony abnormality. 2.  Multilevel degenerative changes as enumerated above. 3.  Bilateral carotid bifurcation plaque.   Original Report Authenticated By: Osa Craver, M.D.    Ct Maxillofacial Wo Cm  01/03/2012  *RADIOLOGY REPORT*  Clinical Data:  Found down the, altered mental status, hypertension  CT HEAD WITHOUT CONTRAST CT MAXILLOFACIAL WITHOUT CONTRAST CT CERVICAL SPINE WITHOUT CONTRAST  Technique:  Multidetector CT imaging of the head, cervical spine, and maxillofacial structures were performed using the standard protocol without intravenous contrast. Multiplanar CT image reconstructions of the cervical spine and maxillofacial structures were also generated.  Comparison:   None  CT HEAD  Findings: Right frontal scalp soft tissue swelling. Atherosclerotic and physiologic intracranial calcifications. Diffuse parenchymal atrophy. Patchy areas of hypoattenuation in deep and periventricular white matter bilaterally. Negative for acute intracranial hemorrhage, mass lesion, acute infarction, midline shift, or mass-effect. Acute infarct may be inapparent on noncontrast CT. Ventricles and sulci symmetric. Bone windows demonstrate  no focal lesion.  IMPRESSION:  1. Negative for bleed or other acute intracranial process.  2. Atrophy and nonspecific white matter changes  CT MAXILLOFACIAL  Findings:  Layering debris in the right frontal sinus.  Remainder of paranasal sinuses are normally developed and well aerated. Nasal septum midline.  Temporomandibular joints seated bilaterally. Mandible intact.  Zygomatic arches intact.  Orbits and globes unremarkable.  Negative for fracture.  Left frontal scalp soft tissue swelling. The  patient is edentulous.  IMPRESSION:  1.  Negative for fracture or other acute bony abnormality.  CT CERVICAL SPINE  Findings:   Normal alignment.  No prevertebral soft tissue swelling.  Facets seated.  There is mild narrowing of the C4-5, C5- 6, and C 7T1 interspaces.  Small end plate spurs are noted at all levels C3-T4.  Degenerative changes at the atlantoaxial articulation.  Negative for fracture.  Bilateral calcified carotid bifurcation plaque.  Remainder visualized paraspinal soft tissues unremarkable.  IMPRESSION: 1.  Negative for fracture or other acute bony abnormality. 2.  Multilevel degenerative changes as enumerated above. 3.  Bilateral carotid bifurcation plaque.   Original Report Authenticated By: Thora Lance III, M.D.     CT of the brain   IMPRESSION:  1. Small amount of intraventricular hemorrhage in the occipital  horn of the left lateral ventricle. No hydrocephalus.  2. Interval formation of a small anterior falcine subdural  hematoma.  3. Left facial and forehead scalp hematoma.   CT angio  Not ordered  MRI of the brain  Pending/postponed  MRA of the brain  Pending/postponed  2D Echocardiogram  Pending  Carotid Doppler  Pending  CXR  Pending  EKG  pending  Physical Exam   The patient is sleepy, but she can be aroused and will follow commands. The patient denies any headache. No family is at bedside currently. The patient has had a progressive worsening of her clinical deficits  mainly secondary to drowsiness.  The patient is sleepy, but can be aroused. Head has traumatic features, with swelling around the left eye and left lip.  Respiratory examination is clear bilaterally  Cardiovascular examination shows a regular rate and rhythm, no obvious murmurs or rubs are noted.  Abdomen reveals positive bowel sounds, no tenderness noted.  Extremities are without significant edema  Neurologic examination reveals some left facial swelling.  The patient has eye deviation to the right, can get the eyes to midpoint, not all the way across to the left. The patient will blink to threat from the right, decreased on the left. Evaluation of the left eye is difficult secondary to periorbital swelling.  The patient has drift on both arms, right side worse than the left. The patient drifts with the left leg, able to elevate the right leg better.  The patient will follow commands all 4 extremities. Cerebellar testing was difficult. The patient could not be ambulated.  Patient response to deep pain stimulation on all 4 extremities.  Deep tendon reflexes are depressed but symmetric throughout.   ASSESSMENT Ms. Amy Martin is a 78 y.o. female with a right brain stroke. On aspirin 325 mg orally every day and clopidogrel 75 mg orally every day for secondary stroke prevention.  Stroke risk factors:  diabetes mellitus, hyperlipidemia and hypertension  Hospital day # 1  TREATMENT/PLAN Continue aspirin 325 mg orally every day and clopidogrel 75 mg orally every day for secondary stroke prevention if no progression of bleeding complications noted.   Ms. Germani is a 78 year old patient with a history of dementia, diabetes, and cerebrovascular disease. The patient presents with left-sided weakness that was profound. The patient received TPA. The patient had a fall prior to her evaluation in the emergency room. The patient had a gradual worsening of her mental status mainly secondary to  lethargy. The patient however, appears to be a will to move all 4 extremities, and she will verbalize. CT the head does show a very minimal falcine subdural hematoma, and minimal  subarachnoid blood in the left occipital horn. No obvious evolution of a new stroke is seen. The patient is lethargic enough that the MRI of the brain should be postponed today. The patient has a DO NOT RESUSCITATE status.  -CT the head in the morning -IV fluids at 75 cc an hour -EEG study, portable -MRI the brain at some point -Supportive care for now, critical care is following -Aspirin therapy at 24 hours following TPA, SAH and SDH are minimal by CT, and likely not the cause of the lethargy -N.p.o. for now secondary to lethargy -Physical, occupational, and speech therapy when appropriate -Will follow clinical evaluation   Lesly Dukes

## 2012-01-04 NOTE — Code Documentation (Signed)
Patient arrived earlier in evening via EMS after unwitnessed fall at facility, there was an unknown down time. Around 2355 patient assessed by nursing and noted facial droop and left sided weakness that was not present upon earlier exam at 2200. Code stroke called at 0001, EDP exam at 2355, stroke team arrived at 0005, LSN at 2200, patient arrived in CT at 0008, labs had been drawn prior to code stroke being activated, additional labs were drawn around 0030, CT read by neurologist at 0030, tPA requested from pharmacy at 0050, tPA delivered to bedside at 0100. tPA started at 0102. Initial NIH 15.

## 2012-01-04 NOTE — Consult Note (Addendum)
[facility] CONSULT NOTE     Reason for Consult: Code Stroke. CC: Left sided weakness.   HPI:    Amy Martin is an 78 y.o. female who initially presented to [facility] ED this evening after a fall. She was found face down and per EMS it was unknown for how long she had been down. Dentures had broken in her mouth and she had dried blood on her face and in her mouth. Noted to have AMS on arrival, but with underlying hx of dementia. Blood glucose was 170.  SBP initially 200 and HR 120. BP was controlled with IV labetalol. On initial ED evaluation, the patient was able to move her left and right sides. Her initial CT showed no acute findings. Initially hypokalemic, which was treated in the ED. She was last seen to be at her presenting neurologic baseline in the ED at 10 PM, able to move all 4 extremities with symmetric facies. Later during her ED evaluation, she suddenly developed dense left hemiparesis and left facial droop. Code stroke was called.   A second STAT CT was obtained. The images are compromised by artifact, but no hemorrhage or other acute findings are seen. I have reviewed the images and agree with the radiology report.   All tPA exclusion criteria were reviewed with family over the telephone. The patient had a confusional spell 2 weeks ago that per report was classified as a stroke; the history provided by family consists only of AMS and hallucinations, and the event is therefore most accurately classifiable as not having been a stroke in my clinical opinion. She also has a slowly bleeding intestinal polyp per family, not requiring transfusions; for the purposes of tPA exclusion criteria, I feel that it is in the best interests of the patient to classify this as not being a major bleed. Similarly, her lip laceration from the fall, although bleeding slowly, is not a contraindication to tPA.   No past medical history on file.  No past surgical history on  file.  No family history on file.  Social History:  does not have a smoking history on file. She does not have any smokeless tobacco history on file. Her alcohol and drug histories not on file.  No Known Allergies  Medications:    Prior to Admission:  (Not in a hospital admission) Scheduled:   . sodium chloride   Intravenous STAT  . alteplase  0.9 mg/kg (Order-Specific) Intravenous Once  . labetalol  10 mg Intravenous Once  . potassium chloride  10 mEq Intravenous Q1 Hr x 3  . DISCONTD: fentaNYL  25 mcg Intravenous Once  . DISCONTD: potassium chloride  40 mEq Oral Once   Continuous:  ZOX:[id number] (ZOFRAN) IV  Review of Systems -  Negative for fever, N/V, abdominal pain. Was complaining of a headache on initial ED evaluation for fall.    Blood pressure 155/56, pulse 86, temperature 98.6 F (37 C), temperature source Axillary, resp. rate 14, SpO2 100.00%.   Neurologic Examination:   Mental Status: Drowsy. Poor attention. Able to follow some simple one-step commands. With severe dysarthria, able to tell examiners her name. Otherwise not oriented.  Cranial Nerves: II- PERRL, no blink to threat on left.  II/IV/VI- Right-sided gaze preference. Unable to gaze past midline towards the left.  V/VII- Prominent left facial droop.  VIII-Answers questions. IX/X-Hypophonic. Severe dysarthria.  XI-0/5 left  trapezius, 5/5 right.  XII- Tongue protrudes midline.  Motor: RUE and RLE 4/5. LUE and LLE 0/5 proximally and distally.  Sensory: Responds to tactile stimuli on right. Responds only to deep noxious stimuli on the left.  DTR's: 1+ RUE, 2+ LUE, trace patellae, absent achilles. Toes upgoing bilaterally.  Cerebellar: Unable to test.   NIHSS = 15  No results found for this basename: cbc, bmp, coags, chol, tri, ldl, hga1c    Results for orders placed during the hospital encounter of 01/03/12 (from the past 48 hour(s))  CBC     Status: Abnormal   Collection Time   01/03/12  8:23  PM      Component Value Range Comment   WBC 8.3  4.0 - 10.5 K/uL    RBC 3.50 (*) 3.87 - 5.11 MIL/uL    Hemoglobin 9.8 (*) 12.0 - 15.0 g/dL    HCT 45.4 (*) 09.8 - 46.0 %    MCV 84.0  78.0 - 100.0 fL    MCH 28.0  26.0 - 34.0 pg    MCHC 33.3  30.0 - 36.0 g/dL    RDW 11.9  14.7 - 82.9 %    Platelets 235  150 - 400 K/uL   COMPREHENSIVE METABOLIC PANEL     Status: Abnormal   Collection Time   01/03/12  8:23 PM      Component Value Range Comment   Sodium 136  135 - 145 mEq/L    Potassium 2.5 (*) 3.5 - 5.1 mEq/L    Chloride 103  96 - 112 mEq/L    CO2 22  19 - 32 mEq/L    Glucose, Bld 214 (*) 70 - 99 mg/dL    BUN 25 (*) 6 - 23 mg/dL    Creatinine, Ser 5.62 (*) 0.50 - 1.10 mg/dL    Calcium 9.6  8.4 - 13.0 mg/dL    Total Protein 6.0  6.0 - 8.3 g/dL    Albumin 2.5 (*) 3.5 - 5.2 g/dL    AST 12  0 - 37 U/L    ALT 7  0 - 35 U/L    Alkaline Phosphatase 83  39 - 117 U/L    Total Bilirubin 0.2 (*) 0.3 - 1.2 mg/dL    GFR calc non Af Amer 38 (*) >90 mL/min    GFR calc Af Amer 44 (*) >90 mL/min   GLUCOSE, CAPILLARY     Status: Abnormal   Collection Time   01/03/12  8:30 PM      Component Value Range Comment   Glucose-Capillary 227 (*) 70 - 99 mg/dL   URINALYSIS, ROUTINE W REFLEX MICROSCOPIC     Status: Abnormal   Collection Time   01/03/12  9:03 PM      Component Value Range Comment   Color, Urine YELLOW  YELLOW    APPearance CLEAR  CLEAR    Specific Gravity, Urine 1.022  1.005 - 1.030    pH 5.5  5.0 - 8.0    Glucose, UA NEGATIVE  NEGATIVE mg/dL    Hgb urine dipstick NEGATIVE  NEGATIVE    Bilirubin Urine NEGATIVE  NEGATIVE    Ketones, ur 15 (*) NEGATIVE mg/dL    Protein, ur 30 (*) NEGATIVE mg/dL    Urobilinogen, UA 1.0  0.0 - 1.0 mg/dL    Nitrite NEGATIVE  NEGATIVE    Leukocytes, UA NEGATIVE  NEGATIVE   URINE MICROSCOPIC-ADD ON     Status: Abnormal   Collection Time   01/03/12  9:03 PM      Component Value Range Comment   Squamous Epithelial / LPF RARE  RARE    WBC, UA 0-2  <3 WBC/hpf     RBC / HPF 3-6  <3 RBC/hpf    Bacteria, UA FEW (*) RARE    Casts HYALINE CASTS (*) NEGATIVE GRANULAR CAST  PROTIME-INR     Status: Normal   Collection Time   01/04/12 12:33 AM      Component Value Range Comment   Prothrombin Time 14.3  11.6 - 15.2 seconds    INR 1.13  0.00 - 1.49   PLATELET COUNT     Status: Normal   Collection Time   01/04/12 12:33 AM      Component Value Range Comment   Platelets 264  150 - 400 K/uL     Ct Head Wo Contrast  01/04/2012  *RADIOLOGY REPORT*  Clinical Data: Left facial droop  CT HEAD WITHOUT CONTRAST  Technique:  Contiguous axial images were obtained from the base of the skull through the vertex without contrast.  Comparison:   the previous day's study  Findings: Atherosclerotic and physiologic intracranial calcifications.  Left frontal scalp soft tissue swelling again noted.  Patient motion degrades many images requiring repeat portions of the scan. Diffuse parenchymal atrophy. Patchy areas of hypoattenuation in deep and periventricular white matter bilaterally. Negative for acute intracranial hemorrhage, mass lesion, acute infarction, midline shift, or mass-effect. Acute infarct may be inapparent on noncontrast CT. Ventricles and sulci symmetric. Bone windows demonstrate no focal lesion.  IMPRESSION:  1. Negative for bleed or other acute intracranial process.  2. Atrophy and nonspecific white matter changes.   Original Report Authenticated By: Osa Craver, M.D.    Ct Head Wo Contrast  01/03/2012  *RADIOLOGY REPORT*  Clinical Data:  Found down the, altered mental status, hypertension  CT HEAD WITHOUT CONTRAST CT MAXILLOFACIAL WITHOUT CONTRAST CT CERVICAL SPINE WITHOUT CONTRAST  Technique:  Multidetector CT imaging of the head, cervical spine, and maxillofacial structures were performed using the standard protocol without intravenous contrast. Multiplanar CT image reconstructions of the cervical spine and maxillofacial structures were also generated.   Comparison:   None  CT HEAD  Findings: Right frontal scalp soft tissue swelling. Atherosclerotic and physiologic intracranial calcifications. Diffuse parenchymal atrophy. Patchy areas of hypoattenuation in deep and periventricular white matter bilaterally. Negative for acute intracranial hemorrhage, mass lesion, acute infarction, midline shift, or mass-effect. Acute infarct may be inapparent on noncontrast CT. Ventricles and sulci symmetric. Bone windows demonstrate no focal lesion.  IMPRESSION:  1. Negative for bleed or other acute intracranial process.  2. Atrophy and nonspecific white matter changes  CT MAXILLOFACIAL  Findings:  Layering debris in the right frontal sinus.  Remainder of paranasal sinuses are normally developed and well aerated. Nasal septum midline.  Temporomandibular joints seated bilaterally. Mandible intact.  Zygomatic arches intact.  Orbits and globes unremarkable.  Negative for fracture.  Left frontal scalp soft tissue swelling. The patient is edentulous.  IMPRESSION:  1.  Negative for fracture or other acute bony abnormality.  CT CERVICAL SPINE  Findings:   Normal alignment.  No prevertebral soft tissue swelling.  Facets seated.  There is mild narrowing of the C4-5, C5- 6, and C 7T1 interspaces.  Small end plate spurs are noted at all levels C3-T4.  Degenerative changes at the atlantoaxial articulation.  Negative for fracture.  Bilateral calcified carotid bifurcation plaque.  Remainder visualized paraspinal soft tissues unremarkable.  IMPRESSION: 1.  Negative  for fracture or other acute bony abnormality. 2.  Multilevel degenerative changes as enumerated above. 3.  Bilateral carotid bifurcation plaque.   Original Report Authenticated By: Osa Craver, M.D.    Ct Cervical Spine Wo Contrast  01/03/2012  *RADIOLOGY REPORT*  Clinical Data:  Found down the, altered mental status, hypertension  CT HEAD WITHOUT CONTRAST CT MAXILLOFACIAL WITHOUT CONTRAST CT CERVICAL SPINE WITHOUT CONTRAST   Technique:  Multidetector CT imaging of the head, cervical spine, and maxillofacial structures were performed using the standard protocol without intravenous contrast. Multiplanar CT image reconstructions of the cervical spine and maxillofacial structures were also generated.  Comparison:   None  CT HEAD  Findings: Right frontal scalp soft tissue swelling. Atherosclerotic and physiologic intracranial calcifications. Diffuse parenchymal atrophy. Patchy areas of hypoattenuation in deep and periventricular white matter bilaterally. Negative for acute intracranial hemorrhage, mass lesion, acute infarction, midline shift, or mass-effect. Acute infarct may be inapparent on noncontrast CT. Ventricles and sulci symmetric. Bone windows demonstrate no focal lesion.  IMPRESSION:  1. Negative for bleed or other acute intracranial process.  2. Atrophy and nonspecific white matter changes  CT MAXILLOFACIAL  Findings:  Layering debris in the right frontal sinus.  Remainder of paranasal sinuses are normally developed and well aerated. Nasal septum midline.  Temporomandibular joints seated bilaterally. Mandible intact.  Zygomatic arches intact.  Orbits and globes unremarkable.  Negative for fracture.  Left frontal scalp soft tissue swelling. The patient is edentulous.  IMPRESSION:  1.  Negative for fracture or other acute bony abnormality.  CT CERVICAL SPINE  Findings:   Normal alignment.  No prevertebral soft tissue swelling.  Facets seated.  There is mild narrowing of the C4-5, C5- 6, and C 7T1 interspaces.  Small end plate spurs are noted at all levels C3-T4.  Degenerative changes at the atlantoaxial articulation.  Negative for fracture.  Bilateral calcified carotid bifurcation plaque.  Remainder visualized paraspinal soft tissues unremarkable.  IMPRESSION: 1.  Negative for fracture or other acute bony abnormality. 2.  Multilevel degenerative changes as enumerated above. 3.  Bilateral carotid bifurcation plaque.   Original  Report Authenticated By: Osa Craver, M.D.    Ct Maxillofacial Wo Cm  01/03/2012  *RADIOLOGY REPORT*  Clinical Data:  Found down the, altered mental status, hypertension  CT HEAD WITHOUT CONTRAST CT MAXILLOFACIAL WITHOUT CONTRAST CT CERVICAL SPINE WITHOUT CONTRAST  Technique:  Multidetector CT imaging of the head, cervical spine, and maxillofacial structures were performed using the standard protocol without intravenous contrast. Multiplanar CT image reconstructions of the cervical spine and maxillofacial structures were also generated.  Comparison:   None  CT HEAD  Findings: Right frontal scalp soft tissue swelling. Atherosclerotic and physiologic intracranial calcifications. Diffuse parenchymal atrophy. Patchy areas of hypoattenuation in deep and periventricular white matter bilaterally. Negative for acute intracranial hemorrhage, mass lesion, acute infarction, midline shift, or mass-effect. Acute infarct may be inapparent on noncontrast CT. Ventricles and sulci symmetric. Bone windows demonstrate no focal lesion.  IMPRESSION:  1. Negative for bleed or other acute intracranial process.  2. Atrophy and nonspecific white matter changes  CT MAXILLOFACIAL  Findings:  Layering debris in the right frontal sinus.  Remainder of paranasal sinuses are normally developed and well aerated. Nasal septum midline.  Temporomandibular joints seated bilaterally. Mandible intact.  Zygomatic arches intact.  Orbits and globes unremarkable.  Negative for fracture.  Left frontal scalp soft tissue swelling. The patient is edentulous.  IMPRESSION:  1.  Negative for fracture or other acute  bony abnormality.  CT CERVICAL SPINE  Findings:   Normal alignment.  No prevertebral soft tissue swelling.  Facets seated.  There is mild narrowing of the C4-5, C5- 6, and C 7T1 interspaces.  Small end plate spurs are noted at all levels C3-T4.  Degenerative changes at the atlantoaxial articulation.  Negative for fracture.  Bilateral  calcified carotid bifurcation plaque.  Remainder visualized paraspinal soft tissues unremarkable.  IMPRESSION: 1.  Negative for fracture or other acute bony abnormality. 2.  Multilevel degenerative changes as enumerated above. 3.  Bilateral carotid bifurcation plaque.   Original Report Authenticated By: Osa Craver, M.D.      Assessment/Plan:   Assessment: 1. Acute onset left hemiplegia and facial droop. Exam localizes as a large area of hypofunction within the right hemisphere, most likely due to proximal right MCA occlusion. The patient falls within the 3 hour tPA window. She has no absolute contraindications against tPA administration.  2. Dementia.  3. S/p fall. No acute fracture seen on CT of head or neck.   Plan: 1. IV tPA indicated for treatment of the patient's acute stroke. Have started infusion at exactly the 3 hour time point (1 AM). Have discussed risks and benefits with the patient's family over the telephone (son and daughter in law). The patient is unable to give informed consent due to her dementia. Risks versus benefits of proceeding with tPA versus no treatment have been discussed at length with her son. I have addressed the equivocating factors in this case, including slow GI bleed due to intestinal polyp and recent prior confusional episode that was most likely misclassified as a stroke. Her advanced age also puts her at increased risk of ICH. The patient's son expressed understanding and agreement with the plan. Telephone consent form signed by MD and the patient's nurse after conferring with son over the telephone.  2. Admit to MICU with post-tPA order set, including frequent neuro checks. Repeat CBC in AM. Guiac all stools. Assess for bleeding at other sites. Mouth care with subctioning to prevent aspiration of blood. Speech evaluation. NPO for now.  Repeat CT after 24 hours or if she neurologically worsens.   I have devoted 95 minutes of total critical care time in  the evaluation and management of this Code Stroke patient.   Electronically signed: Dr. Caryl Pina   01/04/2012, 1:12 AM

## 2012-01-04 NOTE — ED Notes (Signed)
Charge RN made aware of pts status and increased bleeding.

## 2012-01-04 NOTE — Progress Notes (Signed)
eLink Physician-Brief Progress Note Patient Name: Amy Martin DOB: 1919-08-25 MRN: [id number]  Date of Service  01/04/2012   HPI/Events of Note   Cva, s/p tpa   Asked to admit to icu  eICU Interventions  See full pccm  Note to follow   Intervention Category Major Interventions: Other: (cva)  Shan Levans 01/04/2012, 2:44 AM

## 2012-01-04 NOTE — ED Provider Notes (Addendum)
I saw and evaluated the patient, reviewed the resident's note and I agree with the findings and plan.]  Dementia patient from nursing home with unknown downtime.  Laceration to upper lip and palate.  Confused, moving all extremities and following commands.  CT head negative, hypokalemia.   Family reports several "ministrokes" in past several weeks with similar presentations. Will attempt to obtain records from [place].  At 0000, patient noted by admitting doctor not moving L side of body, neglecting L.  Last known normal 2200. Code stroke called.  CT head repeated and negative for ICH. Dr. Otelia Limes and stroke team had extensive discussion with family by phone regarding risks and benefits of tPA.  Patient has dementia, DNR, fall today with lacerations. Family reports history of slow "bleeding polyp" in intestine and episode of confusion 2 weeks ago. "ministrokes" were described by family as episodes of hallucinations.  Dr. Otelia Limes feels these are not contraindications to tPA though she is at increased risk of ICH given her age.  Family and Dr. Otelia Limes elect to proceed with tPA.   Post tPA patient developed significant bleeding from oral laceration.  Required frequent suctioning and extensive suturing to control bleeding. Neurology aware.  LACERATION REPAIR Performed by: Glynn Octave Authorized by: Glynn Octave Consent: Verbal consent obtained. Risks and benefits: risks, benefits and alternatives were discussed Consent given by: patient Patient identity confirmed: provided demographic data Prepped and Draped in normal sterile fashion Wound explored  Laceration Location: upper lip  Laceration Length: 3cm  No Foreign Bodies seen or palpated  Anesthesia: local infiltration  Local anesthetic: lidocaine 2% with epinephrine  Anesthetic total: 10 ml  Irrigation method: syringe Amount of cleaning: standard  Skin closure: 3-0 vicryl   Number of sutures: 8  Technique:  interrupted  Patient tolerance: Patient tolerated the procedure well with no immediate complications.  CRITICAL CARE Performed by: Glynn Octave   Total critical care time: 60  Critical care time was exclusive of separately billable procedures and treating other patients.  Critical care was necessary to treat or prevent imminent or life-threatening deterioration.  Critical care was time spent personally by me on the following activities: development of treatment plan with patient and/or surrogate as well as nursing, discussions with consultants, evaluation of patient's response to treatment, examination of patient, obtaining history from patient or surrogate, ordering and performing treatments and interventions, ordering and review of laboratory studies, ordering and review of radiographic studies, pulse oximetry and re-evaluation of patient's condition.   Glynn Octave, MD 01/04/12 0131  Glynn Octave, MD 01/04/12 [id number]  Glynn Octave, MD 01/04/12 2130  Glynn Octave, MD 01/05/12 1150

## 2012-01-04 NOTE — ED Notes (Signed)
Family at bedside. 

## 2012-01-04 NOTE — ED Notes (Signed)
EDp at bedside suturing pts mouth. Suctioning pt as needed.

## 2012-01-04 NOTE — ED Notes (Signed)
Neurologist and EDP made aware that pt is bleeding in mouth. Pt suctioned.

## 2012-01-04 NOTE — Evaluation (Signed)
Clinical/Bedside Swallow Evaluation Patient Details  Name: Amy Martin MRN: [id number] Date of Birth: 05-Apr-1919  Today's Date: 01/04/2012 Time: 1100-1131 SLP Time Calculation (min): 31 min  Past Medical History:  Past Medical History  Diagnosis Date  . Cancer   . Stroke   . Diabetes mellitus   . Dementia    Past Surgical History: History reviewed. No pertinent past surgical history. HPI:  78 y/o female admitted to ED s/p fall by EMS.  Patient with baseline dementia but noted to present with change in mental status.  During ED evaluation patient developed left hemiparesis and left facial droop.  Code stroke called tPA administered.  CT negative for acute intracranial hemorrhage, mass lesion, acute infarction, midline shift or mass-effect.  Patient referred for BSE per stroke protocol.   Assessment / Plan / Recommendation Clinical Impression   Evaluation limited due to fluctuating LOA.   Moderate amount of blood suctioned from left anterior sucli prior to administration of trial ice chips. Left facial swelling noted reducing left labial ROM.   Patient unable to complete volitional cough/swallow even with max verbal and tactile cues.  Patient noted with spontaneous swallow with slight delay but with adequate hyoid laryngeal elevation. Trial ice chips administered  but SLP eventually suctioned out of oral cavity due to poor oral awareness and inability to manipulate posterior. Recommend continued NPO primarily due to LOA.  ST to reassess swallow bedside for PO readiness on 01/05/12.     Aspiration Risk    Moderate   Diet Recommendation NPO   Medication Administration: Via alternative means    Other  Recommendations Oral Care Recommendations: Oral care QID   Follow Up Recommendations  Skilled Nursing facility    Frequency and Duration min 2x/week  2 weeks       SLP Swallow Goals Goal #3: Patient will maintain functional LOA and correctly follow one to  two step verbal directives  to consume diagnostic PO trials of various consistencies to determine safest, appropriate PO diet .    Swallow Study Prior Functional Status   Unknown    General Date of Onset: 01/03/12 HPI: 78 y/o female admitted to ED s/p fall by EMS.  Patient with baseline dementia but noted to present with change in mental status.  During ED evaluation patient developed left hemiparesis and left facial droop.  Code stroke called tPA administered.  CT negative for acute intracranial hemorrhage, mass lesion, acute infarction, midline shift or mass-effect.   Type of Study: Bedside swallow evaluation Previous Swallow Assessment: unknown Diet Prior to this Study: NPO Temperature Spikes Noted: No Respiratory Status: Room air History of Recent Intubation: No Behavior/Cognition: Lethargic;Requires cueing;Cooperative Oral Cavity - Dentition: Dentures, not available (dentures broken during fall  per medical report) Self-Feeding Abilities: Total assist Patient Positioning: Upright in bed Baseline Vocal Quality: Aphonic Volitional Cough: Cognitively unable to elicit Volitional Swallow: Unable to elicit    Oral/Motor/Sensory Function Overall Oral Motor/Sensory Function: Impaired Labial ROM: Reduced left Labial Symmetry: Abnormal symmetry left Labial Strength: Reduced Labial Sensation: Reduced Lingual ROM: Reduced left Lingual Symmetry: Abnormal symmetry left Lingual Strength: Reduced Lingual Sensation: Reduced Facial ROM: Reduced left Facial Symmetry: Left droop Facial Strength: Reduced Facial Sensation: Reduced   Ice Chips Ice chips: Impaired Oral Phase Impairments: Reduced labial seal;Reduced lingual movement/coordination;Impaired anterior to posterior transit Pharyngeal Phase Impairments: Suspected delayed Swallow   Thin Liquid Thin Liquid: Not tested    Nectar Thick Nectar Thick Liquid: Not tested   Honey Thick Honey Thick Liquid: Not tested  Puree Puree: Not tested   Solid   GO    Solid:  Not tested      Moreen Fowler MS, CCC-SLP [phone] [facility] 01/04/2012,11:51 AM

## 2012-01-04 NOTE — ED Notes (Signed)
Neurologist stated that he wanted to get TPA started and to hold off on Foley.

## 2012-01-04 NOTE — ED Notes (Signed)
Pt transported upstairs with Elvina Mattes, Earl Lites NT, and Critical Care Doctor.

## 2012-01-04 NOTE — Progress Notes (Signed)
Reevaluated patient and discussed/clarified orders with MICU team over telephone. The patient has developed acute hematomas and subcutaneous swelling over her left forehead, left lip and sternal region. ED staff applying pressure to sutured left upper lip to control bleeding. Family distressed by the bleeding and hematoma formation. I discussed the natural history of hematoma formation and tamponade of bleeding. She most likely had preexisting hematomas/bruising at sites of trauma from her fall and tPA has exacerbated bleeding at these sites. This is not life threatening, but aspiration of blood a risk. Team actively suctioning patient and applying pressure to lip laceration. Family reassured that this was a risk of tPA, as discussed over the phone. Overall, the benefits of tPA are still felt to have outweighed risks, given her clinical improvement, which per my repeat exam includes partial recovery of volitional movement of her left leg at 2-3/5 and some volitional movement of the LUE as well. Family expressed reassurance, but daughter in law remains tearful. I will be available to speak again with family as needed until 7 AM sign out/shift change to daytime neurology team.    20 minutes of additional critical care time, including follow up examination of patient and conference with family.   Electronically signed: Dr. Caryl Pina

## 2012-01-04 NOTE — Progress Notes (Signed)
PT/OT Cancellation Note  Patient Details Name: Amy Martin MRN: [id number] DOB: Jun 12, 1919   Cancelled Treatment:    Reason Eval/Treat Not Completed: Medical issues which prohibited therapy (strict bedrest). Will re-attempt pending updated activity orders.  01/04/2012 Cipriano Mile OTR/L Pager [phone] Office [phone]

## 2012-01-04 NOTE — H&P (Signed)
Name: Amy Martin MRN: [id number] DOB: Jun 11, 1919    LOS: 1  Referring Provider:  Caryl Martin Reason for Referral:  Stroke s/p tPA  PULMONARY / CRITICAL CARE MEDICINE  HPI:  Amy Martin is a 78 y/o woman with history of dementia, anemia 2/2 colonic polyp, and recent history of "mini strokes" who presented to the [facility] ED via EMS after a fall.  She was in the process of being admitted to the hospitalist for altered mental status when it was noted that she had a new dense left hemi-paresis.  Code stroke was called and after evaluation by the on call neurologist, it was felt that the benefits of tPA outweighed the risks of bleeding and tPA was administered.  CCM was called for admission to the NSICU for monitoring.  Prior to admission she developed bleeding from a laceration on her lip sustained during her fall, as well as bleeding into the soft tissue of her left eye and chest where she had sustained bruising from her fall.  Her family expressed that her wishes are that she would not want to be intubated or rescitated in the event of pulmonary or cardiac arrest to both the neurologist and the ED staff.  No past medical history on file. No past surgical history on file. Prior to Admission medications   Medication Sig Start Date End Date Taking? Authorizing Provider  acetaminophen (TYLENOL) 325 MG tablet Take 650 mg by mouth every 4 (four) hours as needed. For pain   Yes Historical Provider, MD  aspirin EC 325 MG tablet Take 325 mg by mouth daily.   Yes Historical Provider, MD  atorvastatin (LIPITOR) 40 MG tablet Take 40 mg by mouth daily.   Yes Historical Provider, MD  citalopram (CELEXA) 20 MG tablet Take 20 mg by mouth every morning.   Yes Historical Provider, MD  clopidogrel (PLAVIX) 75 MG tablet Take 75 mg by mouth daily.   Yes Historical Provider, MD  ferrous sulfate 325 (65 FE) MG tablet Take 325 mg by mouth 2 (two) times daily.   Yes Historical Provider, MD  hydrALAZINE (APRESOLINE)  25 MG tablet Take 25 mg by mouth 3 (three) times daily.   Yes Historical Provider, MD  lactobacillus acidophilus (BACID) TABS Take 2 tablets by mouth every 12 (twelve) hours.   Yes Historical Provider, MD  metoprolol (LOPRESSOR) 100 MG tablet Take 100 mg by mouth daily.   Yes Historical Provider, MD  omeprazole (PRILOSEC) 40 MG capsule Take 40 mg by mouth daily.   Yes Historical Provider, MD  polycarbophil (FIBERCON) 625 MG tablet Take 625 mg by mouth 3 (three) times daily.   Yes Historical Provider, MD  polyethylene glycol (MIRALAX / GLYCOLAX) packet Take 17 g by mouth every other day.   Yes Historical Provider, MD  QUEtiapine (SEROQUEL) 25 MG tablet Take 75 mg by mouth at bedtime.   Yes Historical Provider, MD  rivastigmine (EXELON) 1.5 MG capsule Take 1.5 mg by mouth 2 (two) times daily.   Yes Historical Provider, MD  senna (SENOKOT) 8.6 MG TABS Take 1 tablet by mouth daily.   Yes Historical Provider, MD   Allergies No Known Allergies  Family History No family history on file. Social History  does not have a smoking history on file. She does not have any smokeless tobacco history on file. Her alcohol and drug histories not on file.  Review Of Systems:  Could not be obtained secondary to altered mental status  Brief patient description:  78 y/o  woman s/p likely right MCA stroke, administered tPA.  Events Since Admission: Bleeding from lip laceration   Current Status:  Vital Signs: Temp:  [97.9 F (36.6 C)-98.6 F (37 C)] 98.3 F (36.8 C) (10/06 0345) Pulse Rate:  [47-100] 72  (10/06 0415) Resp:  [12-22] 19  (10/06 0415) BP: (102-213)/(43-115) 128/47 mmHg (10/06 0415) SpO2:  [94 %-100 %] 99 % (10/06 0415) Weight:  [55.9 kg (123 lb 3.8 oz)] 55.9 kg (123 lb 3.8 oz) (10/06 0345)  Physical Examination: General:  Laying on stretcher, somnolent but does respond to voice Neuro:  Left facial droop, strength 5/5 on right, varying on left from 1-4/5 depending on time of exam HEENT:   Right pupil round and reactive to light, unable to assess left pupil due to swelling, right EOMI Neck:  supple Cardiovascular:  NRRR, II/VI systolic murmur at LUSB Lungs:  Rhonchi in left lower lung field, otherwise ctab Abdomen:  Soft, NTND, +BS Musculoskeletal:  No joint abnormalities Skin:  Bruising and soft tissue swelling of left eye, sternum and left shoulder  Active Problems:  CVA (cerebral infarction)   ASSESSMENT AND PLAN  PULMONARY No results found for this basename: PHART:5,PCO2:5,PCO2ART:5,PO2ART:5,HCO3:5,O2SAT:5 in the last 168 hours Ventilator Settings:   CXR:  none ETT:  None  A:  Currently stable on room air, at high risk for aspiration given oral bleeding and altered mental status P:   - pt is DNR/DNI - supplemental O2 as needed to maintain sats >92%  CARDIOVASCULAR No results found for this basename: TROPONINI:5,LATICACIDVEN:5, O2SATVEN:5,PROBNP:5 in the last 168 hours ECG:  none Lines: peripheral IVs  A: Hypertensive on admission, now normotensive P:  - given labetolol in ED - PRN labetolol for systolic BP >180  RENAL  Lab 01/03/12 2023  NA 136  K 2.5*  CL 103  CO2 22  BUN 25*  CREATININE 1.21*  CALCIUM 9.6  MG --  PHOS --   Intake/Output    None    Foley:  none  A:  AKI and hypokalemia P:   - replete electrolytes as needed - bolus NS for low urine output - monitor BUN/Cr daily, monitor urine output as able - patient likely somewhat volume down 2/2 decreased PO intake  GASTROINTESTINAL  Lab 01/03/12 2023  AST 12  ALT 7  ALKPHOS 83  BILITOT 0.2*  PROT 6.0  ALBUMIN 2.5*    A:  History of polyp, at risk for swallow dysfunction 2/2 acute stroke P:   - follow for GI bleed - swallow evaluation prior to allowing pt to take po once mental status improves  HEMATOLOGIC  Lab 01/04/12 0033 01/03/12 2023  HGB -- 9.8*  HCT -- 29.4*  PLT 264 235  INR 1.13 --  APTT -- --   A:  Anemia P:  - history of bleeding polyp that has  not required transfusion - bleeding from oral laceration - repeat CBC in AM - transfuse for Hgb <7  INFECTIOUS  Lab 01/03/12 2023  WBC 8.3  PROCALCITON --   Cultures: none Antibiotics: none  A:  No acute signs or symptoms of infection P:   - monitor for signs and sx of infection  ENDOCRINE  Lab 01/03/12 2030  GLUCAP 227*   A:  Hyperglycemia   P:   - q6h accuchecks - SSI  NEUROLOGIC  A:  S/p acute stroke s/p tPA P:   - q1h neuro checks - repeat CT head at 24h or with acute worsening in mental status  -  Neurology following  BEST PRACTICE / DISPOSITION Level of Care:  ICU Primary Service:  PCCM Consultants:  neurology Code Status:  DNR/DNI Diet:  NPO DVT Px:  none GI Px:  PPI Skin Integrity:  Good with exception of bruising from fall Social / Family:  Son present in ED  GIDDINGS, Charlann Lange., M.D. Pulmonary and Critical Care Medicine [facility] Pager: [phone]  01/04/2012, 4:52 AM  I spent 60 minutes of critical care time in the care of this patient.

## 2012-01-04 NOTE — Progress Notes (Signed)
*  PRELIMINARY RESULTS* Echocardiogram 2D Echocardiogram has been performed.  Amy Martin 01/04/2012, 3:44 PM

## 2012-01-04 NOTE — Progress Notes (Signed)
PCCM f/u:  Pt has NIHSS 26.  She has new areas of SDH and small amount of blood in the lateral occipital ventricle.  Family not present.  I would be careful with MRI today.  Pt is DNR/DNI.  Luisa Hart WrightMD

## 2012-01-04 NOTE — ED Notes (Signed)
TPA completed.

## 2012-01-04 NOTE — Progress Notes (Signed)
01/04/2012 Riku Buttery Lynn DPT PAGER: [phone] OFFICE: [phone]   

## 2012-01-04 NOTE — Progress Notes (Signed)
@  1610, performed post TPA assessment and patient exhibited decline in neuro status. Pt more lethargic and right arm no longer able to resist gravity but still had grip, right leg also became slightly weaker. Full NIH assessment  was performed and pt NIH score increased to 22 from a NIH of 15. MD was notified; pt taking down for STAT CT. Will continue to monitor.

## 2012-01-04 NOTE — ED Notes (Signed)
Pt unable to move left side now. Lt facial droop present. EDP and admitting aware.

## 2012-01-04 NOTE — Significant Event (Addendum)
Paged at 11:30pm to evaluate patient by ED for admission: upon evaluation in ed at 11:45pm or so last evening became apparent that patient unable to move L side of body, had L facial droop present.  Was following commands on R side only.  Immediately informed ED physician of this finding, Dr. Aubery Lapping and her attending Dr. Manus Gunning came into room with me and evaluated patient, confirmed that these findings were new since the patients initial evaluation in ED and activated code stroke.  Spoke with patients family on phone during code stroke evaluation and informed them that neurologist would be making decision regarding TPA and that he would be calling them shortly to discuss this with them so they could make an informed decision on the matter.  Shortly after, I was informed by Dr. Otelia Limes and Dr. Manus Gunning that neurology spoke with family, obtained consent for TPA and TPA administered to patient.  Signed off of this patient who would not be admitted to hospitalist floor service at that time (TPA protocol requires ICU admission).

## 2012-01-05 ENCOUNTER — Inpatient Hospital Stay (HOSPITAL_COMMUNITY): Payer: Medicare Other

## 2012-01-05 ENCOUNTER — Encounter (HOSPITAL_COMMUNITY): Payer: Self-pay | Admitting: Radiology

## 2012-01-05 LAB — CBC
HCT: 23.3 % — ABNORMAL LOW (ref 36.0–46.0)
Hemoglobin: 7.7 g/dL — ABNORMAL LOW (ref 12.0–15.0)
MCH: 27.9 pg (ref 26.0–34.0)
MCHC: 33 g/dL (ref 30.0–36.0)

## 2012-01-05 LAB — GLUCOSE, CAPILLARY
Glucose-Capillary: 95 mg/dL (ref 70–99)
Glucose-Capillary: 103 mg/dL — ABNORMAL HIGH (ref 70–99)
Glucose-Capillary: 114 mg/dL — ABNORMAL HIGH (ref 70–99)
Glucose-Capillary: 68 mg/dL — ABNORMAL LOW (ref 70–99)

## 2012-01-05 LAB — COMPREHENSIVE METABOLIC PANEL
BUN: 18 mg/dL (ref 6–23)
Calcium: 9.3 mg/dL (ref 8.4–10.5)
Creatinine, Ser: 1.06 mg/dL (ref 0.50–1.10)
GFR calc Af Amer: 52 mL/min — ABNORMAL LOW (ref >90)
Glucose, Bld: 115 mg/dL — ABNORMAL HIGH (ref 70–99)
Sodium: 141 mEq/L (ref 135–145)
Total Protein: 5.6 g/dL — ABNORMAL LOW (ref 6.0–8.3)

## 2012-01-05 MED ORDER — SODIUM CHLORIDE 0.9 % IV SOLN
INTRAVENOUS | Status: AC
  Administered 2012-01-05: 50 mL/h via INTRAVENOUS
  Administered 2012-01-06: 05:00:00 via INTRAVENOUS

## 2012-01-05 MED ORDER — LABETALOL HCL 100 MG PO TABS
100.0000 mg | ORAL_TABLET | Freq: Three times a day (TID) | ORAL | Status: DC
  Administered 2012-01-06 – 2012-01-09 (×10): 100 mg via ORAL
  Filled 2012-01-05 (×15): qty 1

## 2012-01-05 MED ORDER — DEXTROSE 50 % IV SOLN
25.0000 mL | Freq: Once | INTRAVENOUS | Status: DC
  Administered 2012-01-05: 25 mL via INTRAVENOUS

## 2012-01-05 MED ORDER — DEXTROSE 50 % IV SOLN
INTRAVENOUS | Status: AC
  Filled 2012-01-05: qty 50

## 2012-01-05 MED ORDER — DEXTROSE 50 % IV SOLN
25.0000 mL | INTRAVENOUS | Status: DC | PRN
Start: 2012-01-05 — End: 2012-01-12

## 2012-01-05 MED ORDER — ASPIRIN 300 MG RE SUPP
150.0000 mg | Freq: Every morning | RECTAL | Status: DC
  Administered 2012-01-05 – 2012-01-06 (×2): 150 mg via RECTAL
  Filled 2012-01-05 (×3): qty 1

## 2012-01-05 NOTE — H&P (Signed)
Name: Amy Martin MRN: [id number] DOB: 01/01/1920    LOS: 2  Referring Provider:  Caryl Martin Reason for Referral:  Stroke s/p tPA  PULMONARY / CRITICAL CARE MEDICINE  HPI:  Amy Martin is a 78 y/o woman with history of dementia, anemia 2/2 colonic polyp, and recent history of "mini strokes" who presented to the [facility] ED via EMS after a fall.  She was in the process of being admitted to the hospitalist for altered mental status when it was noted that she had a new dense left hemi-paresis.  Code stroke was called and after evaluation by the on call neurologist, it was felt that the benefits of tPA outweighed the risks of bleeding and tPA was administered.  CCM was called for admission to the NSICU for monitoring.  Prior to admission she developed bleeding from a laceration on her lip sustained during her fall, as well as bleeding into the soft tissue of her left eye and chest where she had sustained bruising from her fall.  Her family expressed that her wishes are that she would not want to be intubated or rescitated in the event of pulmonary or cardiac arrest to both the neurologist and the ED staff.  Vital Signs: Temp:  [98.4 F (36.9 C)-99.4 F (37.4 C)] 98.8 F (37.1 C) (10/07 0726) Pulse Rate:  [37-118] 68  (10/07 0900) Resp:  [8-23] 17  (10/07 0900) BP: (96-211)/(34-96) 161/54 mmHg (10/07 0900) SpO2:  [97 %-100 %] 100 % (10/07 0900) Weight:  [55.7 kg (122 lb 12.7 oz)] 55.7 kg (122 lb 12.7 oz) (10/07 0437)  Physical Examination: General:  Laying on stretcher, somnolent but does respond to voice Neuro:  Left facial droop, strength 5/5 on right, varying on left from 1-4/5 depending on time of exam HEENT:  Right pupil round and reactive to light, unable to assess left pupil due to swelling, right EOMI Neck:  supple Cardiovascular:  NRRR, II/VI systolic murmur at LUSB Lungs:  Rhonchi in left lower lung field, otherwise ctab Abdomen:  Soft, NTND, +BS Musculoskeletal:  No joint  abnormalities Skin:  Bruising and soft tissue swelling of left eye, sternum and left shoulder  Active Problems:  CVA (cerebral infarction)   ASSESSMENT AND PLAN  PULMONARY No results found for this basename: PHART:5,PCO2:5,PCO2ART:5,PO2ART:5,HCO3:5,O2SAT:5 in the last 168 hours Ventilator Settings:   CXR:  none ETT:  None  A:  Currently stable on room air, at high risk for aspiration given oral bleeding and altered mental status P:   - Pt is DNR/DNI - Supplemental O2 as needed to maintain sats >92% - KVO IVF to avoid fluid overload.  CARDIOVASCULAR No results found for this basename: TROPONINI:5,LATICACIDVEN:5, O2SATVEN:5,PROBNP:5 in the last 168 hours ECG:  none Lines: peripheral IVs  A: Hypertensive on admission, now normotensive P:  - Given labetolol in ED. - PRN labetolol for systolic BP >180. - Will need PO labetalol once patient is able to take PO.  RENAL  Lab 01/05/12 0442 01/04/12 1607 01/04/12 0705 01/03/12 2023  NA 141 142 139 136  K 4.2 4.4 -- --  CL 111 111 105 103  CO2 21 23 25 22   BUN 18 23 25* 25*  CREATININE 1.06 1.07 1.14* 1.21*  CALCIUM 9.3 9.6 9.2 9.6  MG -- -- -- --  PHOS -- -- -- --   Intake/Output      10/06 0701 - 10/07 0700 10/07 0701 - 10/08 0700   I.V. (mL/kg) 1706.7 (30.6)    IV Piggyback 400  Total Intake(mL/kg) 2106.7 (37.8)    Net +2106.7         Urine Occurrence 5 x      Intake/Output Summary (Last 24 hours) at 01/05/12 1025 Last data filed at 01/05/12 0700  Gross per 24 hour  Intake 1856.67 ml  Output      0 ml  Net 1856.67 ml   Foley:  none  A:  AKI and hypokalemia P:   - Replete electrolytes as needed - Bolus NS for low urine output - Monitor BUN/Cr daily, monitor urine output as able - BUN:Cr ratio is >20:1, will give one more liter then d/c.  GASTROINTESTINAL  Lab 01/05/12 0442 01/03/12 2023  AST 14 12  ALT 7 7  ALKPHOS 82 83  BILITOT 0.4 0.2*  PROT 5.6* 6.0  ALBUMIN 2.6* 2.5*    A:  History of  polyp, at risk for swallow dysfunction 2/2 acute stroke P:   - Follow for GI bleed - Swallow evaluation prior to allowing pt to take po once mental status improves  HEMATOLOGIC  Lab 01/05/12 0442 01/04/12 0705 01/04/12 0033 01/03/12 2023  HGB 7.7* 8.6* -- 9.8*  HCT 23.3* 26.7* -- 29.4*  PLT 220 236 264 235  INR -- -- 1.13 --  APTT -- -- -- --   A:  Anemia P:  - History of bleeding polyp that has not required transfusion - Bleeding from oral laceration - Repeat CBC in AM - Transfuse for Hgb <7  INFECTIOUS  Lab 01/05/12 0442 01/04/12 0705 01/03/12 2023  WBC 13.3* 10.6* 8.3  PROCALCITON -- -- --   Cultures: none Antibiotics: none  A:  No acute signs or symptoms of infection P:   - Monitor for signs and sx of infection  ENDOCRINE  Lab 01/05/12 0724 01/04/12 1932 01/04/12 1538 01/04/12 1143 01/03/12 2030  GLUCAP 95 130* 109* 101* 227*   A:  Hyperglycemia   P:   - q6h accuchecks - SSI  NEUROLOGIC  A:  S/p acute stroke s/p tPA P:   - q1h neuro checks - repeat CT head at 24h or with acute worsening in mental status  - Neurology following  BEST PRACTICE / DISPOSITION Level of Care:  ICU Primary Service:  PCCM Consultants:  neurology Code Status:  DNR/DNI Diet:  NPO DVT Px:  none GI Px:  PPI Skin Integrity:  Good with exception of bruising from fall Social / Family:  Son present in ED  Will hold in ICU until able to take PO, once BP is more stable with PO medications will transfer to SDU and to [facility].

## 2012-01-05 NOTE — Progress Notes (Signed)
UR COMPLETED  

## 2012-01-05 NOTE — Progress Notes (Signed)
No family at bedside from 0700-1430 to provide education to.

## 2012-01-05 NOTE — Progress Notes (Signed)
EEG completed at bedside 

## 2012-01-05 NOTE — Progress Notes (Signed)
Stroke Team Progress Note  HISTORY  Amy Martin is an 78 y.o. female who initially presented to [facility] ED this evening after a fall. She was found face down and per EMS it was unknown for how long she had been down. Dentures had broken in her mouth and she had dried blood on her face and in her mouth. Noted to have AMS on arrival, but with underlying hx of dementia. Blood glucose was 170. SBP initially 200 and HR 120. BP was controlled with IV labetalol. On initial ED evaluation, the patient was able to move her left and right sides. Her initial CT showed no acute findings. Initially hypokalemic, which was treated in the ED. She was last seen to be at her presenting neurologic baseline in the ED at 10 PM, able to move all 4 extremities with symmetric facies. Later during her ED evaluation, she suddenly developed dense left hemiparesis and left facial droop. Code stroke was called.   A second STAT CT was obtained. The images are compromised by artifact, but no hemorrhage or other acute findings are seen. I have reviewed the images and agree with the radiology report.   All tPA exclusion criteria were reviewed with family over the telephone. The patient had a confusional spell 2 weeks ago that per report was classified as a stroke; the history provided by family consists only of AMS and hallucinations, and the event is therefore most accurately classifiable as not having been a stroke in my clinical opinion. She also has a slowly bleeding intestinal polyp per family, not requiring transfusions; for the purposes of tPA exclusion criteria, I feel that it is in the best interests of the patient to classify this as not being a major bleed. Similarly, her lip laceration from the fall, although bleeding slowly, is not a contraindication to tPA.   LKN 2200, found by nursing at 2355 with sx. TPA: yes,given an 0102  SUBJECTIVE EEG tech at the bedside.  Patient lying in bed. Overall she feel can follow simple  commands.  OBJECTIVE Most recent Vital Signs: Filed Vitals:   01/05/12 0500 01/05/12 0600 01/05/12 0700 01/05/12 0726  BP: 130/51 121/58 121/43   Pulse: 55 43 74   Temp:    98.8 F (37.1 C)  TempSrc:    Oral  Resp: 16 20 17    Height:      Weight:      SpO2: 100% 100% 98%    CBG (last 3)   Basename 01/04/12 1932 01/04/12 1538 01/04/12 1143  GLUCAP 130* 109* 101*   Intake/Output from previous day: 10/06 0701 - 10/07 0700 In: 2106.7 [I.V.:1706.7; IV Piggyback:400] Out: -   IV Fluid Intake:     . 0.9 % NaCl with KCl 20 mEq / L 75 mL/hr (01/04/12 2250)  . DISCONTD: sodium chloride Stopped (01/04/12 1047)    MEDICATIONS    . antiseptic oral rinse  15 mL Mouth Rinse q12n4p  . aspirin  150 mg Rectal q morning - 10a  . chlorhexidine  15 mL Mouth Rinse BID  . dextrose      . insulin aspart  1-3 Units Subcutaneous Q4H  . pantoprazole (PROTONIX) IV  40 mg Intravenous QHS  . potassium chloride  10 mEq Intravenous Q1 Hr x 4  . DISCONTD: dextrose  25 mL Intravenous Once   PRN:  acetaminophen, acetaminophen, dextrose, haloperidol lactate, labetalol, ondansetron (ZOFRAN) IV  Diet:  NPO  liquids Activity:  Bedrest DVT Prophylaxis:  SCD  CLINICALLY SIGNIFICANT STUDIES  Basic Metabolic Panel:  Lab 01/05/12 1610 01/04/12 1607  NA 141 142  K 4.2 4.4  CL 111 111  CO2 21 23  GLUCOSE 115* 117*  BUN 18 23  CREATININE 1.06 1.07  CALCIUM 9.3 9.6  MG -- --  PHOS -- --   Liver Function Tests:  Lab 01/05/12 0442 01/03/12 2023  AST 14 12  ALT 7 7  ALKPHOS 82 83  BILITOT 0.4 0.2*  PROT 5.6* 6.0  ALBUMIN 2.6* 2.5*   CBC:  Lab 01/05/12 0442 01/04/12 0705  WBC 13.3* 10.6*  NEUTROABS -- --  HGB 7.7* 8.6*  HCT 23.3* 26.7*  MCV 84.4 84.2  PLT 220 236   Coagulation:  Lab 01/04/12 0033  LABPROT 14.3  INR 1.13   Cardiac Enzymes: No results found for this basename: CKTOTAL:3,CKMB:3,CKMBINDEX:3,TROPONINI:3 in the last 168 hours Urinalysis:  Lab 01/03/12 2103  COLORURINE  YELLOW  LABSPEC 1.022  PHURINE 5.5  GLUCOSEU NEGATIVE  HGBUR NEGATIVE  BILIRUBINUR NEGATIVE  KETONESUR 15*  PROTEINUR 30*  UROBILINOGEN 1.0  NITRITE NEGATIVE  LEUKOCYTESUR NEGATIVE   Lipid Panel    Component Value Date/Time   CHOL 149 01/04/2012 0705   TRIG 60 01/04/2012 0705   HDL 67 01/04/2012 0705   CHOLHDL 2.2 01/04/2012 0705   VLDL 12 01/04/2012 0705   LDLCALC 70 01/04/2012 0705   HgbA1C  Lab Results  Component Value Date   HGBA1C 6.7* 01/04/2012      Ct Head Wo Contrast  01/05/2012  1.   No new discrete areas of intracranial or extra-axial hemorrhage. 2.  Grossly unchanged trace amount of intraventricular hemorrhage. No hydrocephalus or midline shift. 3.  Previously described small anterior falcine subdural hematoma is not well depicted on this motion degraded examination.  4.  Interval decrease in now small subcutaneous hematoma about the left forehead.  No displaced calvarial fracture.  5.  Extensive atrophy and microvascular ischemic disease.     Ct Head Wo Contrast 01/04/2012  1.  Small amount of intraventricular hemorrhage in the occipital horn of the left lateral ventricle.  No hydrocephalus. 2.  Interval formation of a small anterior falcine subdural hematoma. 3.  Left facial and forehead scalp hematoma.  I discussed these findings by telephone with Dr. Onalee Hua at 7:14 a.m. on 01/04/2012.    Ct Head Wo Contrast 01/04/2012  * 1. Negative for bleed or other acute intracranial process.  2. Atrophy and nonspecific white matter changes.    Ct Head Wo Contrast 01/03/2012   1. Negative for bleed or other acute intracranial process.  2. Atrophy and nonspecific white matter changes. Right frontal scalp soft tissue swelling.  CT MAXILLOFACIAL  01/03/2012 1.  Negative for fracture or other acute bony abnormality.    CT CERVICAL SPINE 01/03/2012   1.  Negative for fracture or other acute bony abnormality. 2.  Multilevel degenerative changes as enumerated above. 3.   Bilateral carotid bifurcation plaque.     Dg Chest Port 1 View 01/05/2012 No acute cardiopulmonary disease  Tortuous aorta with atherosclerotic vascular calcifications.    MRI of the brain    2D Echocardiogram  Left ventricle: The cavity size was normal. Systolicfunction was normal. The estimated ejection fraction wasin the range of 60% to 65%. Wall motion was normal; therewere no regional wall motion abnormalities. Doppler parameters are consistent with abnormal left ventricularrelaxation (grade 1 diastolic dysfunction). - Aortic valve: Trivial regurgitation. - Mitral valve: Moderately calcified annulus. Mild regurgitation. - Left atrium: The atrium was moderately  dilated. - Pulmonary arteries: Systolic pressure was mildly increased.Impressions:- No cardiac source of embolism was identified, but cannotbe ruled out on the basis of this examination.  Carotid Doppler    EKG  normal sinus rhythm.   Therapy Recommendations PT -; OT - ; ST - failed swallow (SNF)  Physical Exam   Elderly African American lady.Awake alert. Afebrile. Head is traumatic with left periorbital and upper limb bruising from fall. Mild edema left upper lip. Neck is supple without bruit. Hearing is normal. Cardiac exam no murmur or gallop. Lungs are clear to auscultation. Distal pulses are well felt.  Neurological Exam ;  drowsy but easily arousable. Follows simple midline and 1 step commands well. Right gaze preference but able to look past midline to left. Blinks to threat bilaterally. Mild dysarthric speech. No aphasia. Fundi not visualized. Mild left lower face weakness and left upper lip swelling.moves all 4 limbs well against gravity but moves left UE less. DTRs symmetric. Sensation and coordination cannot be reliably tested.  ASSESSMENT Ms. Amy Martin is a 78 y.o. female presenting after fall then developing left hemiparesis. Status post IV t-PA  at 0102am. Imaging confirms small white matter changes, due to left  sided hemiparesis, given tPA, now CT shows small amount of intraventricular hemorrhage in the occipital horn of the left lateral ventricle which is stable. Will order MRI. Marland Kitchen Infarct felt to be embolic however work up underway. Other consideration is that patient had seizure since history of fall and recent confusion. On aspirin 325 mg orally every day and plavix prior to admission. Now on aspirin 150mg  daily suppository for secondary stroke prevention. Patient with resultant left hemiparesis, gaze preference, facial abrasion (left)   Left hemiparesis  Diabetes mellitus  Dementia   intraventricular hemorrhage in the occipital horn of the left lateral ventricle  Leukocytosis, likely reactive. Urine negative  Facial abrasion  Hospital day # 2  TREATMENT/PLAN  Continue  rectal asa 150mg  daily for secondary stroke prevention.  Risk factor modification.  Out of bed  Mri brain  Therapies  EEG pending, concern for underlying seizure  No family at bedside This patient is critically ill and at significant risk of neurological worsening, death and care requires constant monitoring of vital signs, hemodynamics,respiratory and cardiac monitoring,review of multiple databases, neurological assessment, discussion with family, other specialists and medical decision making of high complexity. I spent 30 minutes of neurocritical care time  in the care of  this patient.    Guy Franco, [facility],  MBA, MHA [facility] Pager: [phone] 01/05/2012 7:51 AM  Scribe for Dr. Delia Heady, Stroke Center Medical Director. He has personally reviewed chart, pertinent data, examined the patient and developed the plan of care. Pager:  [phone]

## 2012-01-05 NOTE — Evaluation (Signed)
Occupational Therapy Evaluation Patient Details Name: Amy Martin MRN: [id number] DOB: 1919-11-06 Today's Date: 01/05/2012 Time: 4098-1191 OT Time Calculation (min): 26 min  OT Assessment / Plan / Recommendation Clinical Impression  Amy Martin is a 78 y/o woman with history of dementia, anemia 2/2 colonic polyp, and recent history of "mini strokes" who presented to the [facility] ED via EMS after a fall.  She was in the process of being admitted to the hospitalist for altered mental status when it was noted that she had a new dense left hemi-paresis. tPA administered. Pt presents with left-hemiparesis and right gaze preference. Pt will benefit from skilled OT in the acute setting to maximize I with ADL and ADL mobility prior to d/c back to SNF.    OT Assessment  Patient needs continued OT Services    Follow Up Recommendations  Skilled nursing facility;Supervision/Assistance - 24 hour    Barriers to Discharge      Equipment Recommendations  None recommended by PT;None recommended by OT    Recommendations for Other Services    Frequency  Min 3X/week    Precautions / Restrictions Precautions Precautions: Fall Precaution Comments: pt fell prior to admit, was found face down on floor Restrictions Weight Bearing Restrictions: No   Pertinent Vitals/Pain Pt denies any pain throughout session    ADL  Grooming: Performed;Maximal assistance Where Assessed - Grooming: Unsupported sitting Upper Body Dressing: Performed;+1 Total assistance Where Assessed - Upper Body Dressing: Unsupported sitting Lower Body Dressing: Performed;+1 Total assistance Where Assessed - Lower Body Dressing: Supported sit to stand Toilet Transfer: Simulated;+2 Total assistance Toilet Transfer: Patient Percentage: 70% Statistician Method: Sit to Barista:  (from bed) Toileting - Clothing Manipulation and Hygiene: Simulated;+1 Total assistance Where Assessed - Medical sales representative and Hygiene: Sit to stand from 3-in-1 or toilet Equipment Used: Gait belt Transfers/Ambulation Related to ADLs: Pt +2total A(pt=60%) with bil HHA ambulation; pt required manual facilitation to wt shift and to advance LLE    OT Diagnosis: Generalized weakness;Acute pain;Hemiplegia non-dominant side  OT Problem List: Decreased activity tolerance;Decreased strength;Decreased range of motion;Impaired balance (sitting and/or standing);Impaired vision/perception;Decreased coordination;Decreased cognition;Decreased knowledge of use of DME or AE;Decreased knowledge of precautions;Impaired tone;Impaired UE functional use;Pain;Increased edema OT Treatment Interventions: Self-care/ADL training;DME and/or AE instruction;Therapeutic activities;Cognitive remediation/compensation;Visual/perceptual remediation/compensation;Patient/family education;Balance training   OT Goals Acute Rehab OT Goals OT Goal Formulation: With patient Time For Goal Achievement: 01/19/12 Potential to Achieve Goals: Good ADL Goals Pt Will Perform Grooming: with supervision;Sitting, chair;Sitting, edge of bed;Sitting at sink ADL Goal: Grooming - Progress: Goal set today Pt Will Perform Upper Body Bathing: with supervision;Sitting, chair;Sitting, edge of bed ADL Goal: Upper Body Bathing - Progress: Goal set today Pt Will Perform Lower Body Bathing: with min assist;Sit to stand from chair;Sit to stand from bed ADL Goal: Lower Body Bathing - Progress: Goal set today Pt Will Perform Lower Body Dressing: with max assist;Sit to stand from bed;Sit to stand from chair ADL Goal: Lower Body Dressing - Progress: Goal set today Pt Will Transfer to Toilet: with min assist;Ambulation;with DME ADL Goal: Toilet Transfer - Progress: Goal set today Pt Will Perform Toileting - Clothing Manipulation: with min assist;Standing ADL Goal: Toileting - Clothing Manipulation - Progress: Goal set today Pt Will Perform Toileting - Hygiene: with  supervision;Sitting on 3-in-1 or toilet ADL Goal: Toileting - Hygiene - Progress: Goal set today Additional ADL Goal #1: Pt will attend to objects on Rt side with min cues in a structured enviornment ADL  Goal: Additional Goal #1 - Progress: Goal set today  Visit Information  Last OT Received On: 01/05/12 Assistance Needed: +2 (for ambulation) PT/OT Co-Evaluation/Treatment: Yes    Subjective Data  Subjective: Pt agreeable to up with therapy Patient Stated Goal: None stated   Prior Functioning     Home Living Lives With: Other (Comment) (staff at SNF) Available Help at Discharge: Skilled Nursing Facility Type of Home: Skilled Nursing Facility Home Access: Level entry Additional Comments: pt unable to clearly state what equipment she uses, dementia at baseline Prior Function Level of Independence: Needs assistance Driving: No Vocation: Retired Musician: Other (comment) (decreased communication due to lethargy and mouth injury) Dominant Hand: Right         Vision/Perception Vision - Assessment Additional Comments: difficult to assess secondary to swelling. However, pt appears to have right gaze preference but is able to cross midline to the left when cued.   Cognition  Overall Cognitive Status: History of cognitive impairments - further impaired Area of Impairment: Following commands Arousal/Alertness: Lethargic Orientation Level: Time Behavior During Session: Lethargic Following Commands: Follows multi-step commands with increased time;Follows multi-step commands inconsistently (50% of time) Cognition - Other Comments: per chart, pt's cognition has declined from baseline but no family or caregivers present to verify baseline stats     Extremity/Trunk Assessment Right Upper Extremity Assessment RUE ROM/Strength/Tone: Deficits;Unable to fully assess;Due to impaired cognition RUE ROM/Strength/Tone Deficits: h/o Rt shoulder injury- limited shoulder ROM.  Elbow and distally ROM WFL and strength grossly 4/5 RUE Sensation: WFL - Light Touch RUE Coordination:  (unable to test) Left Upper Extremity Assessment LUE ROM/Strength/Tone: Deficits;Unable to fully assess;Due to impaired cognition LUE ROM/Strength/Tone Deficits: shoulder 2/5; elbow 3+/5; wrist and digits 2/5. Questionable tone vs. resistance- will continue to assess LUE Sensation: WFL - Light Touch LUE Coordination: Deficits Right Lower Extremity Assessment RLE ROM/Strength/Tone: Deficits RLE ROM/Strength/Tone Deficits: hip flex 4/5, knee ext 4/5, knee flex 4/5 RLE Sensation: WFL - Light Touch RLE Coordination: WFL - gross motor Left Lower Extremity Assessment LLE ROM/Strength/Tone: Deficits LLE ROM/Strength/Tone Deficits: hip flex 4/5, knee flex 4/5, knee ext 4-/5 however, pt shows motor planning deficits and decreased functional strength with mobility even though she tests very similar on MMT right and left.  When pt stands, she has difficulty advancing the left LE and when she does, step length is decreased LLE Sensation: WFL - Light Touch LLE Coordination: Deficits LLE Coordination Deficits: decreased coordination LLE with ambulation Trunk Assessment Trunk Assessment: Normal     Mobility Bed Mobility Bed Mobility: Supine to Sit Supine to Sit: 4: Min assist Details for Bed Mobility Assistance: vc's to initiate sitting from supine, motor planning deficits noted with mvmt Transfers Sit to Stand: 1: +2 Total assist;From bed;With upper extremity assist Sit to Stand: Patient Percentage: 70% Stand to Sit: To bed;1: +2 Total assist;With upper extremity assist Stand to Sit: Patient Percentage: 70% Details for Transfer Assistance: pt initiates standing but then stands with hips flexed and wt through heels, unable to get wt fwd for independent standing, needed manual facilitation.      Shoulder Instructions     Exercise     Balance Balance Balance Assessed: Yes Static Sitting  Balance Static Sitting - Balance Support: Bilateral upper extremity supported;Feet supported Static Sitting - Level of Assistance: 5: Stand by assistance Static Standing Balance Static Standing - Balance Support: Bilateral upper extremity supported Static Standing - Level of Assistance: 1: +2 Total assist;Patient percentage (comment) (pt 70%)   End of  Session OT - End of Session Equipment Utilized During Treatment: Gait belt Activity Tolerance: Patient limited by fatigue Patient left: in bed;with call bell/phone within reach;with bed alarm set Nurse Communication: Mobility status  GO     Lynnelle Mesmer 01/05/2012, 3:04 PM

## 2012-01-05 NOTE — Evaluation (Signed)
Clinical/Bedside Swallow Evaluation Patient Details  Name: Amy Martin MRN: [id number] Date of Birth: 05-Dec-1919  Today's Date: 01/05/2012 Time: 1191-4782 SLP Time Calculation (min): 1411 min  Past Medical History:  Past Medical History  Diagnosis Date  . Cancer   . Stroke   . Diabetes mellitus   . Dementia    Past Surgical History: History reviewed. No pertinent past surgical history. HPI:  Amy Martin is a 78 y/o woman with history of dementia, anemia 2/2 colonic polyp, and recent history of "mini strokes" who presented to the [facility] ED via EMS after a fall.  She was in the process of being admitted to the hospitalist for altered mental status when it was noted that she had a new dense left hemi-paresis.  Code stroke was called and after evaluation by the on call neurologist, it was felt that the benefits of tPA outweighed the risks of bleeding and tPA was administered.  CCM was called for admission to the NSICU for monitoring.  Prior to admission she developed bleeding from a laceration on her lip sustained during her fall, as well as bleeding into the soft tissue of her left eye and chest where she had sustained bruising from her fall.  Her family expressed that her wishes are that she would not want to be intubated or rescitated in the event of pulmonary or cardiac arrest to both the neurologist and the ED staff.  Patient sustained  cuts to her lip, due to dentures, which were broken during the fall..     Assessment / Plan / Recommendation Clinical Impression  Patient with significant oral phase deficits due to labial edema as well as weakness and decreased sensation on left.  Active bleeding inside the upper left lip noted, with open gash. RN notified. Strained/strangled/harsh vocal quality noted after swallows, with difficulty phonating after spoon sip of H20. Question if suture to upper left inner lip would be beneficial.  At the leaset, recommend ice pack to lip in effort to  reduce swelling.       Aspiration Risk  Severe    Diet Recommendation NPO   Medication Administration: Via alternative means    Other  Recommendations Oral Care Recommendations: Oral care QID Other Recommendations: Other (Comment) (Consider suture to upper left inner lip.)   Follow Up Recommendations  Skilled Nursing facility    Frequency and Duration min 2x/week  2 weeks   Pertinent Vitals/Pain n/a    SLP Swallow Goals     Swallow Study Prior Functional Status       General Date of Onset: 01/03/12 HPI: Amy Martin is a 78 y/o woman with history of dementia, anemia 2/2 colonic polyp, and recent history of "mini strokes" who presented to the [facility] ED via EMS after a fall.  She was in the process of being admitted to the hospitalist for altered mental status when it was noted that she had a new dense left hemi-paresis.  Code stroke was called and after evaluation by the on call neurologist, it was felt that the benefits of tPA outweighed the risks of bleeding and tPA was administered.  CCM was called for admission to the NSICU for monitoring.  Prior to admission she developed bleeding from a laceration on her lip sustained during her fall, as well as bleeding into the soft tissue of her left eye and chest where she had sustained bruising from her fall.  Her family expressed that her wishes are that she would not want to be  intubated or rescitated in the event of pulmonary or cardiac arrest to both the neurologist and the ED staff.  Patient sustained  cuts to her lip, due to dentures, which were broken during the fall..   Type of Study: Bedside swallow evaluation    Oral/Motor/Sensory Function Labial ROM: Reduced left Labial Symmetry: Abnormal symmetry left Labial Strength: Reduced Labial Sensation: Reduced Lingual ROM: Reduced left Lingual Symmetry: Abnormal symmetry left (Significant deviation of the tongue to left.) Lingual Strength: Reduced Lingual Sensation:  Reduced Facial ROM: Reduced left Facial Symmetry: Left droop (Difficulty to assess due to significant edema of upper lip) Facial Strength: Reduced Facial Sensation: Reduced   Ice Chips Ice chips: Impaired Presentation: Spoon Oral Phase Impairments: Reduced labial seal;Reduced lingual movement/coordination;Impaired anterior to posterior transit Oral Phase Functional Implications: Left anterior spillage;Prolonged oral transit Pharyngeal Phase Impairments: Suspected delayed Swallow (Strained/stangled voice quality)   Thin Liquid Thin Liquid: Impaired Presentation: Spoon Oral Phase Impairments: Reduced labial seal Oral Phase Functional Implications: Left anterior spillage;Prolonged oral transit Pharyngeal  Phase Impairments: Wet Vocal Quality;Suspected delayed Swallow    Nectar Thick Nectar Thick Liquid: Not tested   Honey Thick Honey Thick Liquid: Not tested   Puree Puree: Impaired Presentation: Spoon Oral Phase Impairments: Reduced labial seal;Reduced lingual movement/coordination;Impaired anterior to posterior transit (Requires max cues to close lips on spoon ) Oral Phase Functional Implications: Left anterior spillage;Prolonged oral transit Pharyngeal Phase Impairments:  (Strained strangled voice quality.)   Solid   GO    Solid: Not tested      This was billed as swallowing treatment rather than evaluation.  Maryjo Rochester T 01/05/2012,11:36 AM

## 2012-01-05 NOTE — Progress Notes (Signed)
Called by radiology regarding possible abnormality within incidentally imaged dens on brain MRI. A fracture cannot be excluded. Radiology recommends MRI cervical spine with STIR pulse sequence to further evaluate. A soft collar is being applied until patient is cleared.   Also seen on MRI are old microhemorrhages within the brain parenchyma, the previously noted small amount of intraventricular blood, and a previously undetected finding of small, thin layer of subdural blood along the right parietal lobe, most likely secondary to her fall (contrecoup mechanism of injury).  The benefits of ASA continue to outweigh the risks as the intraventricular blood is unchanged and the subdural blood does not appear to be due to rebleeding.   Electronically signed: Dr. Caryl Pina

## 2012-01-05 NOTE — Progress Notes (Addendum)
Most recent CT has been completed (0106), revealing slight improvement relative to the exam performed on 10/6. There still is a small quantity of blood products pooled dependently within the left atrium, slightly diminished relative to the 10/6 study. There does not appear to be any subdural blood, and, on my review of the 10/6 exam images, the reported subdural blood on the radiology report from 10/6 may have been a false positive.   I agree with Dr. Anne Hahn that ASA can be restarted if CT is stable or improved. Benefits for secondary stroke prevention outweigh risks of ICH, given that there has been no rebleeding and the pooled intraventricular blood was a small amount (less than 1 cc) based upon maximum diameter measured on the CT images. Her superficial hematomas are also stable or resolving. I will restart ASA at a dose of 150 mg rectally qd. Will hold off on Plavix for now given the intraventricular blood.  Electronically signed: Dr. Caryl Pina

## 2012-01-05 NOTE — Procedures (Signed)
History: 78 yo F with AMS who then developed left hemiparesis.   Background: The recording was during sleep and waking, during arousal, a PDR of 8 Hz was seen. She does have generalized irregular delta and theta activity. Sleep structures   Photic stimulation: Physiologic driving is not performed  EEG Diagnosis: 1) Generalized irregular slow activity.   Clinical Interpretation: This abnormal EEG is consistent with a mild non-specific cerebral dysfunction. There was no seizure or seizure predisposition recorded on this study.   Ritta Slot, MD [facility] [phone]  If 7pm- 7am, please page neurology on call at [phone].

## 2012-01-05 NOTE — Progress Notes (Signed)
Orthopedic Tech Progress Note Patient Details:  Amy Martin [birth year] [id number]  Ortho Devices Type of Ortho Device: Soft collar Ortho Device/Splint Location: applied to neck Ortho Device/Splint Interventions: Application   Jon Kasparek T 01/05/2012, 11:30 PM

## 2012-01-05 NOTE — Progress Notes (Signed)
Physical Therapy Evaluation Patient Details Name: Amy Martin MRN: [id number] DOB: [birth year] Today's Date: 01/05/2012 Time: 4401-0272 PT Time Calculation (min): 23 min  PT Assessment / Plan / Recommendation Clinical Impression  Pt is 78 yo female with fall and right CVA, who presents with right gaze preference, LUE weakness, LLE motor planning deficits.  Recommend acute PT to increase strength, mobility, and independence.  Recommend d/c back to SNF.     PT Assessment  Patient needs continued PT services    Follow Up Recommendations  Post acute inpatient;Supervision/Assistance - 24 hour    Does the patient have the potential to tolerate intense rehabilitation   No, Recommend SNF  Barriers to Discharge None      Equipment Recommendations  None recommended by PT    Recommendations for Other Services     Frequency Min 4X/week    Precautions / Restrictions Precautions Precautions: Fall Precaution Comments: pt fell prior to admit, was found face down on floor Restrictions Weight Bearing Restrictions: No   Pertinent Vitals/Pain Faces scale 4/10 left sided mouth and lip pain, pt suctioned to remove excess blood from mouth      Mobility  Bed Mobility Bed Mobility: Supine to Sit Supine to Sit: 4: Min assist Details for Bed Mobility Assistance: vc's to initiate sitting from supine, motor planning deficits noted with mvmt Transfers Transfers: Sit to Stand;Stand to Sit Sit to Stand: 1: +2 Total assist;From bed;With upper extremity assist Sit to Stand: Patient Percentage: 70% Stand to Sit: To bed;1: +2 Total assist;With upper extremity assist Stand to Sit: Patient Percentage: 70% Details for Transfer Assistance: pt initiates standing but then stands with hips flexed and wt through heels, unable to get wt fwd for independent standing, needed manual facilitation.  Ambulation/Gait Ambulation/Gait Assistance: 1: +2 Total assist Ambulation/Gait: Patient Percentage:  60% Ambulation Distance (Feet): 8 Feet (4' fwd, 4' bkwd) Assistive device: 2 person hand held assist Ambulation/Gait Assistance Details: with initial stepping, pt unable to move the left LE, with assisted wt-shift to right and manual facilitation of left step, pt able to slide left foot fwd, pt able to move foot with decreasing assistance as she continued to step. Pt able to step feet bkwds without facilitation. Gait Pattern: Decreased step length - left;Decreased stance time - right;Shuffle;Trunk flexed Stairs: No Wheelchair Mobility Wheelchair Mobility: No Modified Rankin (Stroke Patients Only) Pre-Morbid Rankin Score: Moderate disability Modified Rankin: Moderately severe disability    Shoulder Instructions     Exercises     PT Diagnosis: Difficulty walking;Hemiplegia non-dominant side;Acute pain;Altered mental status;Abnormality of gait  PT Problem List: Decreased strength;Decreased balance;Decreased activity tolerance;Decreased mobility;Decreased coordination;Decreased cognition;Decreased knowledge of use of DME;Decreased knowledge of precautions;Pain PT Treatment Interventions: DME instruction;Gait training;Functional mobility training;Therapeutic activities;Therapeutic exercise;Balance training;Neuromuscular re-education;Cognitive remediation;Patient/family education   PT Goals Acute Rehab PT Goals PT Goal Formulation: With patient Time For Goal Achievement: 01/19/12 Potential to Achieve Goals: Good Pt will go Supine/Side to Sit: with supervision PT Goal: Supine/Side to Sit - Progress: Goal set today Pt will go Sit to Supine/Side: with supervision PT Goal: Sit to Supine/Side - Progress: Goal set today Pt will go Sit to Stand: with min assist PT Goal: Sit to Stand - Progress: Goal set today Pt will go Stand to Sit: with min assist PT Goal: Stand to Sit - Progress: Goal set today Pt will Transfer Bed to Chair/Chair to Bed: with min assist PT Transfer Goal: Bed to Chair/Chair to  Bed - Progress: Goal set today Pt will Ambulate: 51 -  150 feet;with min assist;with least restrictive assistive device PT Goal: Ambulate - Progress: Goal set today  Visit Information  Last PT Received On: 01/05/12 Assistance Needed: +2 (for ambulation) PT/OT Co-Evaluation/Treatment: Yes    Subjective Data  Subjective: pt agreeable to getting up Patient Stated Goal: did not state   Prior Functioning  Home Living Lives With: Other (Comment) (staff at SNF) Available Help at Discharge: Skilled Nursing Facility Type of Home: Skilled Nursing Facility Home Access: Level entry Additional Comments: pt unable to clearly state what equipment she uses, dementia at baseline Prior Function Level of Independence: Needs assistance Driving: No Vocation: Retired Musician: Other (comment) (decreased communication due to lethargy and mouth injury) Dominant Hand: Right    Cognition  Overall Cognitive Status: History of cognitive impairments - further impaired Area of Impairment: Following commands Arousal/Alertness: Lethargic Orientation Level: Time Behavior During Session: Lethargic Following Commands: Follows multi-step commands with increased time;Follows multi-step commands inconsistently (50% of time) Cognition - Other Comments: per chart, pt's cognition has declined from baseline but no family or caregivers present to verify baseline stats     Extremity/Trunk Assessment Right Upper Extremity Assessment RUE ROM/Strength/Tone: Deficits;Unable to fully assess;Due to impaired cognition RUE ROM/Strength/Tone Deficits: h/o Rt shoulder injury- limited shoulder ROM. Elbow and distally ROM WFL and strength grossly 4/5 RUE Sensation: WFL - Light Touch RUE Coordination:  (unable to test) Left Upper Extremity Assessment LUE ROM/Strength/Tone: Deficits;Unable to fully assess;Due to impaired cognition LUE ROM/Strength/Tone Deficits: shoulder 2/5; elbow 3+/5; wrist and digits 2/5.  Questionable tone vs. resistance- will continue to assess LUE Sensation: WFL - Light Touch LUE Coordination: Deficits Right Lower Extremity Assessment RLE ROM/Strength/Tone: Deficits RLE ROM/Strength/Tone Deficits: hip flex 4/5, knee ext 4/5, knee flex 4/5 RLE Sensation: WFL - Light Touch RLE Coordination: WFL - gross motor Left Lower Extremity Assessment LLE ROM/Strength/Tone: Deficits LLE ROM/Strength/Tone Deficits: hip flex 4/5, knee flex 4/5, knee ext 4-/5 however, pt shows motor planning deficits and decreased functional strength with mobility even though she tests very similar on MMT right and left.  When pt stands, she has difficulty advancing the left LE and when she does, step length is decreased LLE Sensation: WFL - Light Touch LLE Coordination: Deficits LLE Coordination Deficits: decreased coordination LLE with ambulation Trunk Assessment Trunk Assessment: Normal   Balance Balance Balance Assessed: Yes Static Sitting Balance Static Sitting - Balance Support: Bilateral upper extremity supported;Feet supported Static Sitting - Level of Assistance: 5: Stand by assistance Static Standing Balance Static Standing - Balance Support: Bilateral upper extremity supported Static Standing - Level of Assistance: 1: +2 Total assist;Patient percentage (comment) (pt 70%)  End of Session PT - End of Session Equipment Utilized During Treatment: Gait belt Activity Tolerance: Patient limited by fatigue Patient left: in bed;with call bell/phone within reach Nurse Communication: Mobility status  GP   Lyanne Co, PT  Acute Rehab Services  [phone]   Lyanne Co 01/05/2012, 2:59 PM

## 2012-01-05 NOTE — Progress Notes (Signed)
*  PRELIMINARY RESULTS* Vascular Ultrasound Carotid Duplex (Doppler) has been completed.  Preliminary findings: Bilateral:  No evidence of hemodynamically significant internal carotid artery stenosis.   Vertebral artery flow is antegrade.      Farrel Demark, RDMS, RVT  01/05/2012, 10:19 AM

## 2012-01-05 NOTE — Progress Notes (Signed)
INITIAL ADULT NUTRITION ASSESSMENT Date: 01/05/2012   Time: 9:03 AM Reason for Assessment: MST  INTERVENTION: Recommend short term nutrition support if swallow function will take time to return. Initiate Pivot 1.5 @ 10 ml/hr and increase by 10 ml every 4 hours to goal rate of 40 ml/hr. At goal rate, tube feeding regimen will provide 1440 kcal, 89 grams of protein, and 728 ml of H2O.    DOCUMENTATION CODES Per approved criteria  -Not Applicable    ASSESSMENT: Female 78 y.o.  Dx: Geannie Risen s/p tPA  Hx:  Past Medical History  Diagnosis Date  . Cancer   . Stroke   . Diabetes mellitus   . Dementia    History reviewed. No pertinent past surgical history.  Scheduled Meds:   . antiseptic oral rinse  15 mL Mouth Rinse q12n4p  . aspirin  150 mg Rectal q morning - 10a  . chlorhexidine  15 mL Mouth Rinse BID  . dextrose      . insulin aspart  1-3 Units Subcutaneous Q4H  . pantoprazole (PROTONIX) IV  40 mg Intravenous QHS  . potassium chloride  10 mEq Intravenous Q1 Hr x 4  . DISCONTD: dextrose  25 mL Intravenous Once   Continuous Infusions:   . 0.9 % NaCl with KCl 20 mEq / L 75 mL/hr (01/04/12 2250)  . DISCONTD: sodium chloride Stopped (01/04/12 1047)   PRN Meds:.acetaminophen, acetaminophen, dextrose, haloperidol lactate, labetalol, ondansetron (ZOFRAN) IV   Ht: 5\' 4"  (162.6 cm)  Wt: 122 lb 12.7 oz (55.7 kg)  Ideal Wt: 54.5 kg % Ideal Wt: 102%  Usual Wt:  Wt Readings from Last 10 Encounters:  01/05/12 122 lb 12.7 oz (55.7 kg)   % Usual Wt: -  Body mass index is 21.08 kg/(m^2). WNL  Food/Nutrition Related Hx:  Pt reports weight loss   Labs:  CMP     Component Value Date/Time   NA 141 01/05/2012 0442   K 4.2 01/05/2012 0442   CL 111 01/05/2012 0442   CO2 21 01/05/2012 0442   GLUCOSE 115* 01/05/2012 0442   BUN 18 01/05/2012 0442   CREATININE 1.06 01/05/2012 0442   CALCIUM 9.3 01/05/2012 0442   PROT 5.6* 01/05/2012 0442   ALBUMIN 2.6* 01/05/2012 0442   AST 14  01/05/2012 0442   ALT 7 01/05/2012 0442   ALKPHOS 82 01/05/2012 0442   BILITOT 0.4 01/05/2012 0442   GFRNONAA 45* 01/05/2012 0442   GFRAA 52* 01/05/2012 0442   CBG (last 3)   Basename 01/04/12 1932 01/04/12 1538 01/04/12 1143  GLUCAP 130* 109* 101*    Intake/Output Summary (Last 24 hours) at 01/05/12 0906 Last data filed at 01/05/12 0700  Gross per 24 hour  Intake 2006.67 ml  Output      0 ml  Net 2006.67 ml   No BM documented  Diet Order: NPO  Supplements/Tube Feeding: none  IVF:    0.9 % NaCl with KCl 20 mEq / L Last Rate: 75 mL/hr (01/04/12 2250)  DISCONTD: sodium chloride Last Rate: Stopped (01/04/12 1047)   Pt admitted after fall and developing left hemiparesis. Pt is s/p tPA. Per MD CT shows small amount of intraventricular hemorrhage in the occipital horn of the left lateral ventricle which is stable. Pt failed swallow eval this am.   Estimated Nutritional Needs:   Kcal:  1300-1500 Protein:  65-75 grams Fluid:  >1.5 L/day  NUTRITION DIAGNOSIS: -Inadequate oral intake r/t inability to eat AEB NPO status.  MONITORING/EVALUATION(Goals): Goal: Pt to  meet >/= 90% of their estimated nutrition needs. Monitor: swallow evaluation   EDUCATION NEEDS: -No education needs identified at this time   Kendell Bane RD, LDN, CNSC [phone] Pager [phone] After Hours Pager  01/05/2012, 9:03 AM

## 2012-01-06 ENCOUNTER — Inpatient Hospital Stay (HOSPITAL_COMMUNITY): Payer: Medicare Other

## 2012-01-06 DIAGNOSIS — R220 Localized swelling, mass and lump, head: Secondary | ICD-10-CM | POA: Diagnosis present

## 2012-01-06 DIAGNOSIS — I10 Essential (primary) hypertension: Secondary | ICD-10-CM | POA: Diagnosis present

## 2012-01-06 DIAGNOSIS — N179 Acute kidney failure, unspecified: Secondary | ICD-10-CM | POA: Diagnosis present

## 2012-01-06 DIAGNOSIS — D649 Anemia, unspecified: Secondary | ICD-10-CM | POA: Diagnosis not present

## 2012-01-06 DIAGNOSIS — K130 Diseases of lips: Secondary | ICD-10-CM

## 2012-01-06 LAB — CBC
MCH: 28.1 pg (ref 26.0–34.0)
MCHC: 32.9 g/dL (ref 30.0–36.0)
Platelets: 205 10*3/uL (ref 150–400)
Platelets: 182 10*3/uL (ref 150–400)
RBC: 2.58 MIL/uL — ABNORMAL LOW (ref 3.87–5.11)
RDW: 14.3 % (ref 11.5–15.5)
WBC: 8.3 10*3/uL (ref 4.0–10.5)

## 2012-01-06 LAB — GLUCOSE, CAPILLARY
Glucose-Capillary: 140 mg/dL — ABNORMAL HIGH (ref 70–99)
Glucose-Capillary: 89 mg/dL (ref 70–99)
Glucose-Capillary: 90 mg/dL (ref 70–99)
Glucose-Capillary: 97 mg/dL (ref 70–99)

## 2012-01-06 LAB — BASIC METABOLIC PANEL
Calcium: 9.2 mg/dL (ref 8.4–10.5)
GFR calc non Af Amer: 49 mL/min — ABNORMAL LOW (ref >90)
Sodium: 142 mEq/L (ref 135–145)

## 2012-01-06 LAB — PHOSPHORUS: Phosphorus: 2.4 mg/dL (ref 2.3–4.6)

## 2012-01-06 LAB — OCCULT BLOOD X 1 CARD TO LAB, STOOL: Fecal Occult Bld: NEGATIVE

## 2012-01-06 LAB — MAGNESIUM: Magnesium: 1.7 mg/dL (ref 1.5–2.5)

## 2012-01-06 MED ORDER — BACITRACIN ZINC 500 UNIT/GM EX OINT
TOPICAL_OINTMENT | Freq: Two times a day (BID) | CUTANEOUS | Status: DC
  Administered 2012-01-07 – 2012-01-12 (×11): via TOPICAL
  Filled 2012-01-06 (×3): qty 15

## 2012-01-06 MED ORDER — BACITRACIN 500 UNIT/GM EX OINT
1.0000 "application " | TOPICAL_OINTMENT | Freq: Two times a day (BID) | CUTANEOUS | Status: DC
  Filled 2012-01-06: qty 0.9

## 2012-01-06 MED ORDER — SODIUM CHLORIDE 0.9 % IV SOLN
INTRAVENOUS | Status: DC
  Administered 2012-01-06: 60 mL/h via INTRAVENOUS
  Administered 2012-01-08: 1000 mL via INTRAVENOUS

## 2012-01-06 MED ORDER — AMOXICILLIN 500 MG PO CAPS
500.0000 mg | ORAL_CAPSULE | Freq: Three times a day (TID) | ORAL | Status: AC
  Administered 2012-01-06 – 2012-01-11 (×15): 500 mg via ORAL
  Filled 2012-01-06 (×15): qty 1

## 2012-01-06 MED ORDER — PIPERACILLIN-TAZOBACTAM 3.375 G IVPB
3.3750 g | Freq: Three times a day (TID) | INTRAVENOUS | Status: DC
  Administered 2012-01-06: 3.375 g via INTRAVENOUS
  Filled 2012-01-06 (×3): qty 50

## 2012-01-06 NOTE — Consult Note (Signed)
Unassigned Consult  Reason for Consult:Melena, Anemia Referring Physician: CCM  Amy Martin HPI: This is a 78 year old female that was admitted for a code stroke and she received tPA.  Before her arrival she was found to be down and the duration was unknown.  She was noted to have significant lacerations to her left upper lip and she also had a hematoma on her head.  The lip lacerations were secondary to her fall and dentures traumatizing the area.  Her left forehead and scalp were traumatized with the fall and it worsened when she received tPA.  A repeat CT scan did reveal some bleeding at the falcine subdural region and a small intraventricular bleed.  Over the course of her hospitalization her HGB was noted to drop down from the 9 range to the 7 range.  Additionally, she was found today to have melena.  In the past she had a colonoscopy, per the other notes for a bleeding polyps.  GI is consulted for the anemia and melena.  Past Medical History  Diagnosis Date  . Cancer   . Stroke   . Diabetes mellitus   . Dementia     History reviewed. No pertinent past surgical history.  History reviewed. No pertinent family history.  Social History:  reports that she quit smoking about 33 years ago. Her smoking use included Cigarettes. She does not have any smokeless tobacco history on file. She reports that she does not drink alcohol. Her drug history not on file.  Allergies: No Known Allergies  Medications:  Scheduled:   . antiseptic oral rinse  15 mL Mouth Rinse q12n4p  . aspirin  150 mg Rectal q morning - 10a  . chlorhexidine  15 mL Mouth Rinse BID  . insulin aspart  1-3 Units Subcutaneous Q4H  . labetalol  100 mg Oral TID  . pantoprazole (PROTONIX) IV  40 mg Intravenous QHS  . piperacillin-tazobactam (ZOSYN)  IV  3.375 g Intravenous Q8H   Continuous:   . sodium chloride 50 mL/hr at 01/06/12 0439  . sodium chloride 60 mL/hr (01/06/12 1134)    Results for orders placed during the  hospital encounter of 01/03/12 (from the past 24 hour(s))  GLUCOSE, CAPILLARY     Status: Abnormal   Collection Time   01/05/12  8:41 PM      Component Value Range   Glucose-Capillary 103 (*) 70 - 99 mg/dL   Comment 1 Notify RN    GLUCOSE, CAPILLARY     Status: Normal   Collection Time   01/05/12 11:56 PM      Component Value Range   Glucose-Capillary 90  70 - 99 mg/dL   Comment 1 Notify RN    GLUCOSE, CAPILLARY     Status: Normal   Collection Time   01/06/12  4:36 AM      Component Value Range   Glucose-Capillary 75  70 - 99 mg/dL  CBC     Status: Abnormal   Collection Time   01/06/12  6:18 AM      Component Value Range   WBC 9.0  4.0 - 10.5 K/uL   RBC 2.67 (*) 3.87 - 5.11 MIL/uL   Hemoglobin 7.5 (*) 12.0 - 15.0 g/dL   HCT 16.1 (*) 09.6 - 04.5 %   MCV 85.4  78.0 - 100.0 fL   MCH 28.1  26.0 - 34.0 pg   MCHC 32.9  30.0 - 36.0 g/dL   RDW 40.9  81.1 - 91.4 %  Platelets 205  150 - 400 K/uL  BASIC METABOLIC PANEL     Status: Abnormal   Collection Time   01/06/12  6:18 AM      Component Value Range   Sodium 142  135 - 145 mEq/L   Potassium 4.0  3.5 - 5.1 mEq/L   Chloride 112  96 - 112 mEq/L   CO2 21  19 - 32 mEq/L   Glucose, Bld 84  70 - 99 mg/dL   BUN 13  6 - 23 mg/dL   Creatinine, Ser 1.61  0.50 - 1.10 mg/dL   Calcium 9.2  8.4 - 09.6 mg/dL   GFR calc non Af Amer 49 (*) >90 mL/min   GFR calc Af Amer 57 (*) >90 mL/min  MAGNESIUM     Status: Normal   Collection Time   01/06/12  6:18 AM      Component Value Range   Magnesium 1.7  1.5 - 2.5 mg/dL  PHOSPHORUS     Status: Normal   Collection Time   01/06/12  6:18 AM      Component Value Range   Phosphorus 2.4  2.3 - 4.6 mg/dL  GLUCOSE, CAPILLARY     Status: Normal   Collection Time   01/06/12  9:27 AM      Component Value Range   Glucose-Capillary 89  70 - 99 mg/dL  GLUCOSE, CAPILLARY     Status: Abnormal   Collection Time   01/06/12 11:29 AM      Component Value Range   Glucose-Capillary 140 (*) 70 - 99 mg/dL  OCCULT  BLOOD X 1 CARD TO LAB, STOOL     Status: Normal   Collection Time   01/06/12 12:28 PM      Component Value Range   Fecal Occult Bld NEGATIVE    CBC     Status: Abnormal   Collection Time   01/06/12  1:21 PM      Component Value Range   WBC 8.3  4.0 - 10.5 K/uL   RBC 2.58 (*) 3.87 - 5.11 MIL/uL   Hemoglobin 7.2 (*) 12.0 - 15.0 g/dL   HCT 04.5 (*) 40.9 - 81.1 %   MCV 84.1  78.0 - 100.0 fL   MCH 27.9  26.0 - 34.0 pg   MCHC 33.2  30.0 - 36.0 g/dL   RDW 91.4  78.2 - 95.6 %   Platelets 182  150 - 400 K/uL     Ct Head Wo Contrast  01/05/2012  *RADIOLOGY REPORT*  Clinical Data: 24 hours post t-PA  CT HEAD WITHOUT CONTRAST  Technique:  Contiguous axial images were obtained from the base of the skull through the vertex without contrast.  Comparison: Head CT - 01/04/2012; 01/03/2012  Findings:  Examination degraded secondary to patient motion artifact.  Previously identified trace amount of hemorrhage lying dependently within the occipital horn of the left lateral ventricle is unchanged to minimally decreased.  There is likely a trace amount of intraventricular blood within the right lateral ventricle (image 20, series 2).  Previously described small anterior falcine subdural hematoma is not definitively seen on this motion degraded examination.  No new discrete areas of intraparenchymal or extra- axial hemorrhage.  Extensive atrophy and microvascular ischemic disease. Calcifications within the right basal ganglia. Given extensive background parenchymal abnormalities there is no definitive evidence of acute large territory infarct.  Unchanged size and configuration of the ventricles and basilar cisterns.  No midline shift.  No definite intraparenchymal or extra-axial mass.  The paranasal sinuses and mastoid air cells are normal.  Soft tissue swelling about the left forehead (image 18 and 19) has decreased in the interval.  No associated displaced calvarial fracture. Post bilateral cataract surgery.   IMPRESSION:  1.   No new discrete areas of intracranial or extra-axial hemorrhage. 2.  Grossly unchanged trace amount of intraventricular hemorrhage. No hydrocephalus or midline shift. 3.  Previously described small anterior falcine subdural hematoma is not well depicted on this motion degraded examination.  4.  Interval decrease in now small subcutaneous hematoma about the left forehead.  No displaced calvarial fracture.  5.  Extensive atrophy and microvascular ischemic disease.   Original Report Authenticated By: Waynard Reeds, M.D.    Mr Brain Wo Contrast  01/05/2012  **ADDENDUM** CREATED: 01/05/2012 20:05:01  Results discussed with Dr. Otelia Limes 10 /09/2011 8:05 p.m.  **END ADDENDUM** SIGNED BY: Almedia Balls. Constance Goltz, M.D.   01/05/2012  *RADIOLOGY REPORT*  Clinical Data: Post fall.  Left hemiparesis.  Post t-PA 2 days ago.  MRI HEAD WITHOUT CONTRAST  Technique:  Multiplanar, multiecho pulse sequences of the brain and surrounding structures were obtained according to standard protocol without intravenous contrast.  Comparison: 01/03/2012, 01/04/2012 and 01/05/2012 CT.  No comparison MR.  Findings: Moderate size acute right hemispheric infarct involving the right opercular region, right sub insular region, right frontal lobe and junction with the right parietal lobe.  Left supraorbital subcutaneous hematoma/soft tissue swelling.  Broad-based right parietal - occipital - posterior right temporal subdural hematoma measuring up to 4.4 mm.  Small amount of dependent interventricular blood.  Multiple areas of blood breakdown products throughout the basal ganglia and thalami and less so left frontal lobe may reflect changes of the prior episode hemorrhagic ischemia although small shear type injuries could contribute to this appearance.  Prominent small vessel disease type changes.  Global atrophy without hydrocephalus.  No intracranial mass lesion noted on this unenhanced examination as seen separate from the above  described findings.  Major intracranial vascular structures are patent.  Mild mucosal thickening paranasal sinuses.  Sclerotic linear structure through the base of the dens.   MR of the cervical spine with STIR sequence can be obtained to see if there is a CT occult injury at this level.  IMPRESSION: Moderate size acute right hemispheric infarct as detailed above.  Broad-based right parietal - occipital - posterior right temporal subdural hematoma measuring up to 4.4 mm.  Sclerotic linear structure through the base of the dens.   MR of the cervical spine with STIR sequence can be obtained to see if there is a CT occult injury at this level.  Multiple areas of blood breakdown products throughout the basal ganglia and thalami and less so left frontal lobe may reflect changes of the prior episode hemorrhagic ischemia although small shear type injuries could contribute to this appearance.  Prominent small vessel disease type changes.  Critical Value/emergent results were called by telephone at the time of interpretation on 01/05/2012 at 7:50 p.m. to Margaretha Glassing the patient's nurse pain, who verbally acknowledged these results.  Call into Dr. Agnes Lawrence,   Original Report Authenticated By: Fuller Canada, M.D.    Mr Cervical Spine Wo Contrast  01/06/2012  **ADDENDUM** CREATED: 01/06/2012 09:46:42  These results were called by telephone on 01/06/2012 at 09:45 p.m. to Annie Main, NP, who verbally acknowledged these results.  **END ADDENDUM** SIGNED BY: Chauncey Fischer, M.D.   01/06/2012  *RADIOLOGY REPORT*  Clinical Data: The dens fracture  MRI CERVICAL SPINE WITHOUT CONTRAST  Technique:  Multiplanar and multiecho pulse sequences of the cervical spine, to include the craniocervical junction and cervicothoracic junction, were obtained according to standard protocol without intravenous contrast.  Comparison: MRI of the brain 01/05/2012.  CT of the cervical spine 01/03/2012.  Findings: MRI of the cervical  spine demonstrates marrow acute or healing fracture within the C2 vertebral body.  Thicker slices were utilized on the sagittal brain sequence.  The area in question on the previous study actually represents the disc space.  Craniocervical junction is within normal limits.  Moderate atrophy is again noted.  Normal signal is present within the cervical upper thoracic spinal cord.  C2-3:  Moderate left mild right foraminal stenosis secondary to uncovertebral disease.  C3-4:  Moderate central canal stenosis is present with some flattening of the cervical spine.  Moderate foraminal stenosis is present bilaterally.  C4-5:  Mild to moderate central canal stenosis is present with effacement of the ventral CSF.  Mild to moderate foraminal narrowing is present bilaterally.  C5-6:  Moderate uncovertebral spurring is present bilaterally, leading to foraminal stenosis.  Mild to moderate central canal stenosis is present with effacement of the ventral CSF.  C6-7:  A broad-based disc osteophyte complex effaces ventral CSF. Mild foraminal stenosis is present bilaterally.  C7-T1:  No significant disc herniation or stenosis is present.  IMPRESSION:  1. No fracture at C2. 2.  Moderate left and mild right foraminal stenosis at C2-3. 3. Moderate central and bilateral foraminal stenosis at C3-4.  This is the worst level spondylosis. 4.  Mild to moderate central and bilateral foraminal stenosis at C4- 5. 5.  Mild to moderate central canal stenosis at C5-6. 6.  Mild foraminal narrowing bilaterally at C6-7.   Original Report Authenticated By: Jamesetta Orleans. MATTERN, M.D.    Dg Chest Port 1 View  01/05/2012  *RADIOLOGY REPORT*  Clinical Data: Dyspnea  PORTABLE CHEST - 1 VIEW  Comparison: None.  Findings: The lungs are well-aerated.  Negative for pulmonary edema, or focal airspace consolidation.  No pneumothorax, or pleural effusion.  Cardiac and mediastinal contours within normal limits.  The aorta is tortuous and there is  atherosclerotic vascular calcifications involving the transverse and descending segments.  No acute osseous abnormality.  Degenerative changes of the bilateral acromioclavicular joints.  IMPRESSION:  No acute cardiopulmonary disease  Tortuous aorta with atherosclerotic vascular calcifications.   Original Report Authenticated By: Sterling Big, M.D.     ROS:  As stated above in the HPI otherwise negative.  Blood pressure 145/63, pulse 68, temperature 97.9 F (36.6 C), temperature source Oral, resp. rate 17, height 5\' 4"  (1.626 m), weight 55.7 kg (122 lb 12.7 oz), SpO2 100.00%.    PE: Gen: NAD, Alert and Oriented Oral:  Edematous left upper lip, large nonbleeding laceration approximately 2-2.5 cm HEENT:  [place]/AT, EOMI Neck: Supple, no LAD Lungs: CTA Bilaterally CV: RRR without M/G/R ABM: Soft, NTND, +BS Ext: No C/C/E  Assessment/Plan: 1) Anemia. 2) Melena. 3) Stroke.   I cannot discern if she is truly having a GI bleed.  I cannot confirm or refute the history of a bleeding polyp.  Her melena can be from swallowed blood and her anemia is multifactorial from the trauma.  She may have a Gi source of bleeding that was brought out by the tPA, but I will monitor closely for now.  I do not feel she requires an emergent EGD.  Plan: 1) Follow HGB. 2) Transfuse as necessary. 3)  I will follow along and make clinical decisions pending her course.  Jacky Hartung D 01/06/2012, 5:18 PM

## 2012-01-06 NOTE — Progress Notes (Signed)
Pt had moderate sized BM which was tar colored with greenish tinge.  Called Dr. Frederico Hamman and sent hemoccult sample.  Pt will remain NPO until H/H completed. Will continue to monitor. Amy Martin

## 2012-01-06 NOTE — Progress Notes (Signed)
Stroke Team Progress Note  HISTORY  Amy Martin is an 78 y.o. female who initially presented to [facility] ED this evening after a fall. She was found face down and per EMS it was unknown for how long she had been down. Dentures had broken in her mouth and she had dried blood on her face and in her mouth. Noted to have AMS on arrival, but with underlying hx of dementia. Blood glucose was 170. SBP initially 200 and HR 120. BP was controlled with IV labetalol. On initial ED evaluation, the patient was able to move her left and right sides. Her initial CT showed no acute findings. Initially hypokalemic, which was treated in the ED. She was last seen to be at her presenting neurologic baseline in the ED at 10 PM, able to move all 4 extremities with symmetric facies. Later during her ED evaluation, she suddenly developed dense left hemiparesis and left facial droop. Code stroke was called.   A second STAT CT was obtained. The images are compromised by artifact, but no hemorrhage or other acute findings are seen. I have reviewed the images and agree with the radiology report.   All tPA exclusion criteria were reviewed with family over the telephone. The patient had a confusional spell 2 weeks ago that per report was classified as a stroke; the history provided by family consists only of AMS and hallucinations, and the event is therefore most accurately classifiable as not having been a stroke in my clinical opinion. She also has a slowly bleeding intestinal polyp per family, not requiring transfusions; for the purposes of tPA exclusion criteria, I feel that it is in the best interests of the patient to classify this as not being a major bleed. Similarly, her lip laceration from the fall, although bleeding slowly, is not a contraindication to tPA.   LKN 2200, found by nursing at 2355 with sx. 01/03/2012 TPA: yes,given an 0102   01/04/2012  SUBJECTIVE  More awake today  OBJECTIVE Most recent Vital Signs: Filed  Vitals:   01/05/12 1839 01/05/12 2352 01/06/12 0402 01/06/12 0657  BP: 201/81 178/69 177/65 173/67  Pulse: 80 86 89 88  Temp: 98.3 F (36.8 C) 98.8 F (37.1 C) 98.5 F (36.9 C) 98.9 F (37.2 C)  TempSrc: Oral Oral Axillary Oral  Resp: 18 18 18 18   Height:      Weight:      SpO2: 100% 100% 100% 100%   CBG (last 3)   Basename 01/06/12 0436 01/05/12 2356 01/05/12 2041  GLUCAP 75 90 103*   Intake/Output from previous day:    IV Fluid Intake:      . sodium chloride 50 mL/hr at 01/06/12 0439  . DISCONTD: 0.9 % NaCl with KCl 20 mEq / L 75 mL/hr (01/04/12 2250)    MEDICATIONS     . antiseptic oral rinse  15 mL Mouth Rinse q12n4p  . aspirin  150 mg Rectal q morning - 10a  . chlorhexidine  15 mL Mouth Rinse BID  . dextrose      . insulin aspart  1-3 Units Subcutaneous Q4H  . labetalol  100 mg Oral TID  . pantoprazole (PROTONIX) IV  40 mg Intravenous QHS   PRN:  acetaminophen, acetaminophen, dextrose, haloperidol lactate, labetalol, ondansetron (ZOFRAN) IV  Diet:  NPO  Activity:  ambulate DVT Prophylaxis:  scd  CLINICALLY SIGNIFICANT STUDIES Basic Metabolic Panel:   Lab 01/06/12 0618 01/05/12 0442  NA 142 141  K 4.0 4.2  CL 112  111  CO2 21 21  GLUCOSE 84 115*  BUN 13 18  CREATININE 0.98 1.06  CALCIUM 9.2 9.3  MG 1.7 --  PHOS 2.4 --   Liver Function Tests:   Lab 01/05/12 0442 01/03/12 2023  AST 14 12  ALT 7 7  ALKPHOS 82 83  BILITOT 0.4 0.2*  PROT 5.6* 6.0  ALBUMIN 2.6* 2.5*   CBC:   Lab 01/06/12 0618 01/05/12 0442  WBC 9.0 13.3*  NEUTROABS -- --  HGB 7.5* 7.7*  HCT 22.8* 23.3*  MCV 85.4 84.4  PLT 205 220   Coagulation:   Lab 01/04/12 0033  LABPROT 14.3  INR 1.13   Cardiac Enzymes: No results found for this basename: CKTOTAL:3,CKMB:3,CKMBINDEX:3,TROPONINI:3 in the last 168 hours Urinalysis:   Lab 01/03/12 2103  COLORURINE YELLOW  LABSPEC 1.022  PHURINE 5.5  GLUCOSEU NEGATIVE  HGBUR NEGATIVE  BILIRUBINUR NEGATIVE  KETONESUR 15*    PROTEINUR 30*  UROBILINOGEN 1.0  NITRITE NEGATIVE  LEUKOCYTESUR NEGATIVE   Lipid Panel    Component Value Date/Time   CHOL 149 01/04/2012 0705   TRIG 60 01/04/2012 0705   HDL 67 01/04/2012 0705   CHOLHDL 2.2 01/04/2012 0705   VLDL 12 01/04/2012 0705   LDLCALC 70 01/04/2012 0705   HgbA1C  Lab Results  Component Value Date   HGBA1C 6.7* 01/04/2012      Ct Head Wo Contrast  01/05/2012  1.   No new discrete areas of intracranial or extra-axial hemorrhage. 2.  Grossly unchanged trace amount of intraventricular hemorrhage. No hydrocephalus or midline shift. 3.  Previously described small anterior falcine subdural hematoma is not well depicted on this motion degraded examination.  4.  Interval decrease in now small subcutaneous hematoma about the left forehead.  No displaced calvarial fracture.  5.  Extensive atrophy and microvascular ischemic disease.     Ct Head Wo Contrast 01/04/2012  1.  Small amount of intraventricular hemorrhage in the occipital horn of the left lateral ventricle.  No hydrocephalus. 2.  Interval formation of a small anterior falcine subdural hematoma. 3.  Left facial and forehead scalp hematoma.  I discussed these findings by telephone with Dr. Onalee Hua at 7:14 a.m. on 01/04/2012.    Ct Head Wo Contrast 01/04/2012  * 1. Negative for bleed or other acute intracranial process.  2. Atrophy and nonspecific white matter changes.    Ct Head Wo Contrast 01/03/2012   1. Negative for bleed or other acute intracranial process.  2. Atrophy and nonspecific white matter changes. Right frontal scalp soft tissue swelling.  CT MAXILLOFACIAL  01/03/2012 1.  Negative for fracture or other acute bony abnormality.    CT CERVICAL SPINE 01/03/2012   1.  Negative for fracture or other acute bony abnormality. 2.  Multilevel degenerative changes as enumerated above. 3.  Bilateral carotid bifurcation plaque.     Dg Chest Port 1 View 01/05/2012 No acute cardiopulmonary disease   Tortuous aorta with atherosclerotic vascular calcifications.    MRI of the brain   Moderate size acute right hemispheric infarct as detailed above.  Broad-based right parietal - occipital - posterior right temporal  subdural hematoma measuring up to 4.4 mm.  Sclerotic linear structure through the base of the dens. MR of  the cervical spine with STIR sequence can be obtained to see if  there is a CT occult injury at this level.  Multiple areas of blood breakdown products throughout the basal  ganglia and thalami and less so left frontal lobe  may reflect  changes of the prior episode hemorrhagic ischemia although small  shear type injuries could contribute to this appearance.  Prominent small vessel disease type changes.  MRI Spine 1. No fracture at C2.  2. Moderate left and mild right foraminal stenosis at C2-3.  3. Moderate central and bilateral foraminal stenosis at C3-4. This  is the worst level spondylosis.  4. Mild to moderate central and bilateral foraminal stenosis at C4-  5.  5. Mild to moderate central canal stenosis at C5-6.  6. Mild foraminal narrowing bilaterally at C6-7.   2D Echocardiogram  Left ventricle: The cavity size was normal. Systolicfunction was normal. The estimated ejection fraction wasin the range of 60% to 65%. Wall motion was normal; therewere no regional wall motion abnormalities. Doppler parameters are consistent with abnormal left ventricularrelaxation (grade 1 diastolic dysfunction). - Aortic valve: Trivial regurgitation. - Mitral valve: Moderately calcified annulus. Mild regurgitation. - Left atrium: The atrium was moderately dilated. - Pulmonary arteries: Systolic pressure was mildly increased.Impressions:- No cardiac source of embolism was identified, but cannotbe ruled out on the basis of this examination.  EEG1) Generalized irregular slow activity.  Clinical Interpretation: This abnormal EEG is consistent with a mild non-specific cerebral  dysfunction. There was no seizure or seizure predisposition recorded on this study.   Carotid Doppler  No evidence of hemodynamically significant internal carotid artery stenosis. Vertebral artery flow is antegrade.   EKG  normal sinus rhythm.   Therapy Recommendations PT -; OT - ; ST - failed swallow (SNF)  Physical Exam   Elderly African American lady.Awake alert. Afebrile. Head is traumatic with left periorbital and upper limb bruising from fall. Mild edema left upper lip. Neck is supple without bruit. Hearing is normal. Cardiac exam no murmur or gallop. Lungs are clear to auscultation. Distal pulses are well felt.  Neurological Exam ;  drowsy but easily arousable. Follows simple midline and 1 step commands well. Right gaze preference but able to look past midline to left. Blinks to threat bilaterally. Mild dysarthric speech. No aphasia. Fundi not visualized. Mild left lower face weakness and left upper lip swelling.moves all 4 limbs well against gravity but moves left UE less. DTRs symmetric. Sensation and coordination cannot be reliably tested.  ASSESSMENT Amy Martin is a 78 y.o. female presenting after fall then developing left hemiparesis. Status post IV t-PA  at 0102am. Imaging confirms small white matter changes, due to left sided hemiparesis, given tPA, now CT shows small amount of intraventricular hemorrhage in the occipital horn of the left lateral ventricle which is stable. Will order MRI. Marland Kitchen Infarct felt to be embolic however work up underway. Other consideration is that patient had seizure since history of fall and recent confusion. On aspirin 325 mg orally every day and plavix prior to admission. Now on aspirin 150mg  daily suppository for secondary stroke prevention. Patient with resultant left hemiparesis, gaze preference, facial abrasion (left)   Left hemiparesis, she developed in the ER, was given tPA  Diabetes mellitus  Dementia   intraventricular hemorrhage in the  occipital horn of the left lateral ventricle, showed on f/u CT/MRI  Leukocytosis, resolved  Facial abrasion  Hospital day # 3  TREATMENT/PLAN  Continue  rectal asa 150mg  daily for secondary stroke prevention.  Risk factor modification.  Therapies, SNF Stroke Service will sign off. Follow up with Dr. Pearlean Brownie in 2 months.  Guy Franco, [facility],  MBA, MHA [facility] Pager: [phone] 01/06/2012 3:47 PM  Scribe for Dr. Janalyn Shy  Pearlean Brownie, Stroke Chief Executive Officer. He has personally reviewed chart, pertinent data, examined the patient and developed the plan of care. Pager:  [phone]

## 2012-01-06 NOTE — Progress Notes (Signed)
RN from [facility] in [place] called to let us know pt was being followed by them at [facility].  They would need an order to resume at discharge. Thanks. Thomas Hoff

## 2012-01-06 NOTE — Progress Notes (Signed)
Inpatient Diabetes Program Recommendations  AACE/ADA: New Consensus Statement on Inpatient Glycemic Control (2013)  Target Ranges:  Prepandial:   less than 140 mg/dL      Peak postprandial:   less than 180 mg/dL (1-2 hours)      Critically ill patients:  140 - 180 mg/dL   Reason for Visit: Note glucose elevated on admission.  A1C=6.7%.  CBG's have been less than 150 mg/dL during hospitalization. Please discontinue ICU Glycemic Control protocol/correction scale.  Does not appear to need insulin at this time.  Will follow.

## 2012-01-06 NOTE — Progress Notes (Addendum)
Speech Language Pathology Dysphagia Treatment Patient Details Name: Amy Martin MRN: [id number] DOB: [birth year] Today's Date: 01/06/2012 Time: 4696-2952 SLP Time Calculation (min): 81 min  Assessment / Plan / Recommendation Clinical Impression  Patient continues to have edema and a reddened puncture wound that appears to go through the upper left lip.  Upon closer inspection, there is a laceration on the inner left upper lip encrusted with dried blood.  Patient is hungry, and all attempts be made to avoid having to place a feeding tube, however, this SLP is concerned about introducing food into the wound due to potential infection.  The laceration does appear to be healing, and is not actively bleeding, as it was yesterday.  Discussed this with MD, and also requested an ENT consult.  MD reports hemoglobin is also low, and there is question of a GI bleed, therefore, further p.o.'s will be held until further lab work completed.  Patient shows s/s of aspiration with sips of H2) from spoon (positive cough and wet voice quality noted).  Current Lung sounds with crackles at right lower base per pulmonology.  Pateint appears to tolerate nectar thick liquids and purees, when placed on the right side of the mouth, but requires max. verbal cues, and sometimes even tactile cues, to colse lips around the spoon to remove the bolus, and then again, a verbal cue to swallow.  Patient will need total and careful assist with eating.  Discussed and demonstrated this with RN and CNA.  Careful oralcare before and after p.o.'s is also needed and was discussed.    Diet Recommendation  Continue with Current Diet:  (If MD agrees) Initiate / Change Diet: Dysphagia 1 (puree);Nectar-thick liquid (When MD feels patient is ready)    SLP Plan Goals updated   Pertinent Vitals/Pain n/a   Swallowing Goals  SLP Swallowing Goals Patient will consume recommended diet without observed clinical signs of aspiration with: Total  assistance;Maximal cueing Patient will utilize recommended strategies during swallow to increase swallowing safety with: Maximum assistance;Maximal cueing  General Temperature Spikes Noted: No Respiratory Status: Room air Behavior/Cognition: Cooperative;Alert;Distractible;Requires cueing;Decreased sustained attention Oral Cavity - Dentition: Edentulous (Dentures were broken when she fell.) Patient Positioning: Upright in chair  Oral Cavity - Oral Hygiene Does patient have any of the following "at risk" factors?: Lips - dry, cracked (Puncture wound through upper left lip, laceration inner lip ) Brush patient's teeth BID with toothbrush (using toothpaste with fluoride):  (Gentle oral care with swabs and warm cloth) Patient is HIGH RISK - Oral Care Protocol followed (see row info): Yes Patient is AT RISK - Oral Care Protocol followed (see row info): Yes Patient is mechanically ventilated, follow VAP prevention protocol for oral care: Oral care provided every 4 hours   Dysphagia Treatment Treatment focused on: Facilitation of oral phase;Facilitation of oral preparatory phase;Upgraded PO texture trials;Utilization of compensatory strategies (Careful oral care.  Significant trauma to left upper lip.) Treatment Methods/Modalities: Skilled observation Patient observed directly with PO's: Yes Type of PO's observed: Dysphagia 1 (puree);Nectar-thick liquids;Thin liquids Feeding: Total assist Liquids provided via: Teaspoon (Unable to obtain labial seal around cup or straw.) Oral Phase Signs & Symptoms: Prolonged oral phase Pharyngeal Phase Signs & Symptoms: Suspected delayed swallow initiation;Multiple swallows;Wet vocal quality;Immediate cough Type of cueing: Verbal;Tactile Amount of cueing: Maximal   GO     Maryjo Rochester T 01/06/2012, 11:45 AM

## 2012-01-06 NOTE — Consult Note (Signed)
Amy Martin, Amy Martin 78 y.o., female [id number]     Chief Complaint: Lip swelling  HPI: A 37 -year-old black female admitted to the hospital 2+ days ago after sustaining a fall and then a right brain stroke. She sustained facial bruising and internal left upper lip laceration. Her dentures were broken by the fall. She underwent TPA treatment for the stroke and developed brisk bleeding from the lip and also facial hematoma.  The bleeding stopped after suture closure of the lip. Today, she seemed to have increasing swelling of the lip and was placed on Zosyn for possible infection. Otherwise her facial swelling is improving. Her neurologic status is also improving.   PMH: Past Medical History  Diagnosis Date  . Cancer   . Stroke   . Diabetes mellitus   . Dementia     Surg WU:[id number] reviewed. No pertinent past surgical history.  FHx:  History reviewed. No pertinent family history. SocHx:  reports that she quit smoking about 33 years ago. Her smoking use included Cigarettes. She does not have any smokeless tobacco history on file. She reports that she does not drink alcohol. Her drug history not on file.  ALLERGIES: No Known Allergies  Medications Prior to Admission  Medication Sig Dispense Refill  . acetaminophen (TYLENOL) 325 MG tablet Take 650 mg by mouth every 4 (four) hours as needed. For pain      . aspirin EC 325 MG tablet Take 325 mg by mouth daily.      Marland Kitchen atorvastatin (LIPITOR) 40 MG tablet Take 40 mg by mouth daily.      . citalopram (CELEXA) 20 MG tablet Take 20 mg by mouth every morning.      . clopidogrel (PLAVIX) 75 MG tablet Take 75 mg by mouth daily.      . ferrous sulfate 325 (65 FE) MG tablet Take 325 mg by mouth 2 (two) times daily.      . hydrALAZINE (APRESOLINE) 25 MG tablet Take 25 mg by mouth 3 (three) times daily.      Marland Kitchen lactobacillus acidophilus (BACID) TABS Take 2 tablets by mouth every 12 (twelve) hours.      . metoprolol (LOPRESSOR) 100 MG tablet Take 100 mg by  mouth daily.      Marland Kitchen omeprazole (PRILOSEC) 40 MG capsule Take 40 mg by mouth daily.      . polycarbophil (FIBERCON) 625 MG tablet Take 625 mg by mouth 3 (three) times daily.      . polyethylene glycol (MIRALAX / GLYCOLAX) packet Take 17 g by mouth every other day.      Marland Kitchen QUEtiapine (SEROQUEL) 25 MG tablet Take 75 mg by mouth at bedtime.      . rivastigmine (EXELON) 1.5 MG capsule Take 1.5 mg by mouth 2 (two) times daily.      Marland Kitchen senna (SENOKOT) 8.6 MG TABS Take 1 tablet by mouth daily.        Results for orders placed during the hospital encounter of 01/03/12 (from the past 48 hour(s))  GLUCOSE, CAPILLARY     Status: Abnormal   Collection Time   01/04/12  7:32 PM      Component Value Range Comment   Glucose-Capillary 130 (*) 70 - 99 mg/dL   GLUCOSE, CAPILLARY     Status: Abnormal   Collection Time   01/05/12 12:01 AM      Component Value Range Comment   Glucose-Capillary 68 (*) 70 - 99 mg/dL    Comment 1 Notify RN  GLUCOSE, CAPILLARY     Status: Abnormal   Collection Time   01/05/12  1:44 AM      Component Value Range Comment   Glucose-Capillary 114 (*) 70 - 99 mg/dL   GLUCOSE, CAPILLARY     Status: Abnormal   Collection Time   01/05/12  4:16 AM      Component Value Range Comment   Glucose-Capillary 111 (*) 70 - 99 mg/dL   CBC     Status: Abnormal   Collection Time   01/05/12  4:42 AM      Component Value Range Comment   WBC 13.3 (*) 4.0 - 10.5 K/uL    RBC 2.76 (*) 3.87 - 5.11 MIL/uL    Hemoglobin 7.7 (*) 12.0 - 15.0 g/dL    HCT 16.1 (*) 09.6 - 46.0 %    MCV 84.4  78.0 - 100.0 fL    MCH 27.9  26.0 - 34.0 pg    MCHC 33.0  30.0 - 36.0 g/dL    RDW 04.5  40.9 - 81.1 %    Platelets 220  150 - 400 K/uL   COMPREHENSIVE METABOLIC PANEL     Status: Abnormal   Collection Time   01/05/12  4:42 AM      Component Value Range Comment   Sodium 141  135 - 145 mEq/L    Potassium 4.2  3.5 - 5.1 mEq/L    Chloride 111  96 - 112 mEq/L    CO2 21  19 - 32 mEq/L    Glucose, Bld 115 (*) 70 - 99  mg/dL    BUN 18  6 - 23 mg/dL    Creatinine, Ser 9.14  0.50 - 1.10 mg/dL    Calcium 9.3  8.4 - 78.2 mg/dL    Total Protein 5.6 (*) 6.0 - 8.3 g/dL    Albumin 2.6 (*) 3.5 - 5.2 g/dL    AST 14  0 - 37 U/L    ALT 7  0 - 35 U/L    Alkaline Phosphatase 82  39 - 117 U/L    Total Bilirubin 0.4  0.3 - 1.2 mg/dL    GFR calc non Af Amer 45 (*) >90 mL/min    GFR calc Af Amer 52 (*) >90 mL/min   GLUCOSE, CAPILLARY     Status: Normal   Collection Time   01/05/12  7:24 AM      Component Value Range Comment   Glucose-Capillary 95  70 - 99 mg/dL   GLUCOSE, CAPILLARY     Status: Abnormal   Collection Time   01/05/12  2:11 PM      Component Value Range Comment   Glucose-Capillary 103 (*) 70 - 99 mg/dL   GLUCOSE, CAPILLARY     Status: Abnormal   Collection Time   01/05/12  4:50 PM      Component Value Range Comment   Glucose-Capillary 104 (*) 70 - 99 mg/dL    Comment 1 Documented in Chart      Comment 2 Notify RN     GLUCOSE, CAPILLARY     Status: Abnormal   Collection Time   01/05/12  8:41 PM      Component Value Range Comment   Glucose-Capillary 103 (*) 70 - 99 mg/dL    Comment 1 Notify RN     GLUCOSE, CAPILLARY     Status: Normal   Collection Time   01/05/12 11:56 PM      Component Value Range Comment   Glucose-Capillary  90  70 - 99 mg/dL    Comment 1 Notify RN     GLUCOSE, CAPILLARY     Status: Normal   Collection Time   01/06/12  4:36 AM      Component Value Range Comment   Glucose-Capillary 75  70 - 99 mg/dL   CBC     Status: Abnormal   Collection Time   01/06/12  6:18 AM      Component Value Range Comment   WBC 9.0  4.0 - 10.5 K/uL    RBC 2.67 (*) 3.87 - 5.11 MIL/uL    Hemoglobin 7.5 (*) 12.0 - 15.0 g/dL    HCT 91.4 (*) 78.2 - 46.0 %    MCV 85.4  78.0 - 100.0 fL    MCH 28.1  26.0 - 34.0 pg    MCHC 32.9  30.0 - 36.0 g/dL    RDW 95.6  21.3 - 08.6 %    Platelets 205  150 - 400 K/uL   BASIC METABOLIC PANEL     Status: Abnormal   Collection Time   01/06/12  6:18 AM      Component  Value Range Comment   Sodium 142  135 - 145 mEq/L    Potassium 4.0  3.5 - 5.1 mEq/L    Chloride 112  96 - 112 mEq/L    CO2 21  19 - 32 mEq/L    Glucose, Bld 84  70 - 99 mg/dL    BUN 13  6 - 23 mg/dL    Creatinine, Ser 5.78  0.50 - 1.10 mg/dL    Calcium 9.2  8.4 - 46.9 mg/dL    GFR calc non Af Amer 49 (*) >90 mL/min    GFR calc Af Amer 57 (*) >90 mL/min   MAGNESIUM     Status: Normal   Collection Time   01/06/12  6:18 AM      Component Value Range Comment   Magnesium 1.7  1.5 - 2.5 mg/dL   PHOSPHORUS     Status: Normal   Collection Time   01/06/12  6:18 AM      Component Value Range Comment   Phosphorus 2.4  2.3 - 4.6 mg/dL   GLUCOSE, CAPILLARY     Status: Normal   Collection Time   01/06/12  9:27 AM      Component Value Range Comment   Glucose-Capillary 89  70 - 99 mg/dL   GLUCOSE, CAPILLARY     Status: Abnormal   Collection Time   01/06/12 11:29 AM      Component Value Range Comment   Glucose-Capillary 140 (*) 70 - 99 mg/dL   OCCULT BLOOD X 1 CARD TO LAB, STOOL     Status: Normal   Collection Time   01/06/12 12:28 PM      Component Value Range Comment   Fecal Occult Bld NEGATIVE     CBC     Status: Abnormal   Collection Time   01/06/12  1:21 PM      Component Value Range Comment   WBC 8.3  4.0 - 10.5 K/uL    RBC 2.58 (*) 3.87 - 5.11 MIL/uL    Hemoglobin 7.2 (*) 12.0 - 15.0 g/dL    HCT 62.9 (*) 52.8 - 46.0 %    MCV 84.1  78.0 - 100.0 fL    MCH 27.9  26.0 - 34.0 pg    MCHC 33.2  30.0 - 36.0 g/dL    RDW 41.3  24.4 -  15.5 %    Platelets 182  150 - 400 K/uL   GLUCOSE, CAPILLARY     Status: Normal   Collection Time   01/06/12  5:01 PM      Component Value Range Comment   Glucose-Capillary 97  70 - 99 mg/dL    Ct Head Wo Contrast  01/05/2012  *RADIOLOGY REPORT*  Clinical Data: 24 hours post t-PA  CT HEAD WITHOUT CONTRAST  Technique:  Contiguous axial images were obtained from the base of the skull through the vertex without contrast.  Comparison: Head CT - 01/04/2012;  01/03/2012  Findings:  Examination degraded secondary to patient motion artifact.  Previously identified trace amount of hemorrhage lying dependently within the occipital horn of the left lateral ventricle is unchanged to minimally decreased.  There is likely a trace amount of intraventricular blood within the right lateral ventricle (image 20, series 2).  Previously described small anterior falcine subdural hematoma is not definitively seen on this motion degraded examination.  No new discrete areas of intraparenchymal or extra- axial hemorrhage.  Extensive atrophy and microvascular ischemic disease. Calcifications within the right basal ganglia. Given extensive background parenchymal abnormalities there is no definitive evidence of acute large territory infarct.  Unchanged size and configuration of the ventricles and basilar cisterns.  No midline shift.  No definite intraparenchymal or extra-axial mass.  The paranasal sinuses and mastoid air cells are normal.  Soft tissue swelling about the left forehead (image 18 and 19) has decreased in the interval.  No associated displaced calvarial fracture. Post bilateral cataract surgery.  IMPRESSION:  1.   No new discrete areas of intracranial or extra-axial hemorrhage. 2.  Grossly unchanged trace amount of intraventricular hemorrhage. No hydrocephalus or midline shift. 3.  Previously described small anterior falcine subdural hematoma is not well depicted on this motion degraded examination.  4.  Interval decrease in now small subcutaneous hematoma about the left forehead.  No displaced calvarial fracture.  5.  Extensive atrophy and microvascular ischemic disease.   Original Report Authenticated By: Waynard Reeds, M.D.    Mr Brain Wo Contrast  01/05/2012  **ADDENDUM** CREATED: 01/05/2012 20:05:01  Results discussed with Dr. Otelia Limes 10 /09/2011 8:05 p.m.  **END ADDENDUM** SIGNED BY: Almedia Balls. Constance Goltz, M.D.   01/05/2012  *RADIOLOGY REPORT*  Clinical Data: Post fall.  Left  hemiparesis.  Post t-PA 2 days ago.  MRI HEAD WITHOUT CONTRAST  Technique:  Multiplanar, multiecho pulse sequences of the brain and surrounding structures were obtained according to standard protocol without intravenous contrast.  Comparison: 01/03/2012, 01/04/2012 and 01/05/2012 CT.  No comparison MR.  Findings: Moderate size acute right hemispheric infarct involving the right opercular region, right sub insular region, right frontal lobe and junction with the right parietal lobe.  Left supraorbital subcutaneous hematoma/soft tissue swelling.  Broad-based right parietal - occipital - posterior right temporal subdural hematoma measuring up to 4.4 mm.  Small amount of dependent interventricular blood.  Multiple areas of blood breakdown products throughout the basal ganglia and thalami and less so left frontal lobe may reflect changes of the prior episode hemorrhagic ischemia although small shear type injuries could contribute to this appearance.  Prominent small vessel disease type changes.  Global atrophy without hydrocephalus.  No intracranial mass lesion noted on this unenhanced examination as seen separate from the above described findings.  Major intracranial vascular structures are patent.  Mild mucosal thickening paranasal sinuses.  Sclerotic linear structure through the base of the dens.   MR of the  cervical spine with STIR sequence can be obtained to see if there is a CT occult injury at this level.  IMPRESSION: Moderate size acute right hemispheric infarct as detailed above.  Broad-based right parietal - occipital - posterior right temporal subdural hematoma measuring up to 4.4 mm.  Sclerotic linear structure through the base of the dens.   MR of the cervical spine with STIR sequence can be obtained to see if there is a CT occult injury at this level.  Multiple areas of blood breakdown products throughout the basal ganglia and thalami and less so left frontal lobe may reflect changes of the prior episode  hemorrhagic ischemia although small shear type injuries could contribute to this appearance.  Prominent small vessel disease type changes.  Critical Value/emergent results were called by telephone at the time of interpretation on 01/05/2012 at 7:50 p.m. to Margaretha Glassing the patient's nurse pain, who verbally acknowledged these results.  Call into Dr. Agnes Lawrence,   Original Report Authenticated By: Fuller Canada, M.D.    Mr Cervical Spine Wo Contrast  01/06/2012  **ADDENDUM** CREATED: 01/06/2012 09:46:42  These results were called by telephone on 01/06/2012 at 09:45 p.m. to Annie Main, NP, who verbally acknowledged these results.  **END ADDENDUM** SIGNED BY: Chauncey Fischer, M.D.   01/06/2012  *RADIOLOGY REPORT*  Clinical Data: The dens fracture  MRI CERVICAL SPINE WITHOUT CONTRAST  Technique:  Multiplanar and multiecho pulse sequences of the cervical spine, to include the craniocervical junction and cervicothoracic junction, were obtained according to standard protocol without intravenous contrast.  Comparison: MRI of the brain 01/05/2012.  CT of the cervical spine 01/03/2012.  Findings: MRI of the cervical spine demonstrates marrow acute or healing fracture within the C2 vertebral body.  Thicker slices were utilized on the sagittal brain sequence.  The area in question on the previous study actually represents the disc space.  Craniocervical junction is within normal limits.  Moderate atrophy is again noted.  Normal signal is present within the cervical upper thoracic spinal cord.  C2-3:  Moderate left mild right foraminal stenosis secondary to uncovertebral disease.  C3-4:  Moderate central canal stenosis is present with some flattening of the cervical spine.  Moderate foraminal stenosis is present bilaterally.  C4-5:  Mild to moderate central canal stenosis is present with effacement of the ventral CSF.  Mild to moderate foraminal narrowing is present bilaterally.  C5-6:  Moderate uncovertebral  spurring is present bilaterally, leading to foraminal stenosis.  Mild to moderate central canal stenosis is present with effacement of the ventral CSF.  C6-7:  A broad-based disc osteophyte complex effaces ventral CSF. Mild foraminal stenosis is present bilaterally.  C7-T1:  No significant disc herniation or stenosis is present.  IMPRESSION:  1. No fracture at C2. 2.  Moderate left and mild right foraminal stenosis at C2-3. 3. Moderate central and bilateral foraminal stenosis at C3-4.  This is the worst level spondylosis. 4.  Mild to moderate central and bilateral foraminal stenosis at C4- 5. 5.  Mild to moderate central canal stenosis at C5-6. 6.  Mild foraminal narrowing bilaterally at C6-7.   Original Report Authenticated By: Jamesetta Orleans. MATTERN, M.D.    Dg Chest Port 1 View  01/06/2012  *RADIOLOGY REPORT*  Clinical Data: Right lower lobe crackles concerning for pneumonia  PORTABLE CHEST - 1 VIEW  Comparison: 01/05/2012  Findings: Normal heart size and vascularity.  Negative for CHF, edema, significant effusion, or pneumothorax.  Lower lung volumes accentuating the basilar vascular markings  and minimal atelectasis. Atherosclerosis of the aorta.  Degenerative changes of the spine. Left axillary clips noted.  Diffuse osteopenia evident.  IMPRESSION: Low volume exam with atelectasis.   Original Report Authenticated By: Judie Petit. Ruel Favors, M.D.    Dg Chest Port 1 View  01/05/2012  *RADIOLOGY REPORT*  Clinical Data: Dyspnea  PORTABLE CHEST - 1 VIEW  Comparison: None.  Findings: The lungs are well-aerated.  Negative for pulmonary edema, or focal airspace consolidation.  No pneumothorax, or pleural effusion.  Cardiac and mediastinal contours within normal limits.  The aorta is tortuous and there is atherosclerotic vascular calcifications involving the transverse and descending segments.  No acute osseous abnormality.  Degenerative changes of the bilateral acromioclavicular joints.  IMPRESSION:  No acute  cardiopulmonary disease  Tortuous aorta with atherosclerotic vascular calcifications.   Original Report Authenticated By: Sterling Big, M.D.      Blood pressure 145/63, pulse 68, temperature 97.9 F (36.6 C), temperature source Oral, resp. rate 17, height 5\' 4"  (1.626 m), weight 55.7 kg (122 lb 12.7 oz), SpO2 100.00%.  PHYSICAL EXAM: Overall appearance: She is awake and responds to direct commands.  Head:  Left periorbital ecchymosis with mild swelling. Left mid facial swelling. No erythema or tenderness.  Ears:  Mild dry wax accumulation bilateral  Nose:  Moist and patent  Oral Cavity:  Edentulous. A sutured laceration inside the left upper lip. Submucosal ecchymotic discoloration. There is an apparent 1 cm puncture wound on the external skin of the left upper lip. Entire lip is mildly tense but not fluctuant. No obvious erythema. Oral Pharynx/Hypopharynx/Larynx:  Oropharynx is clear  Neck:  Clear    Assessment/Plan Left upper lip hematoma. No clear evidence of infection.  I probed the wound externally and internally receiving some old semiliquid dark blood but no pus.  I would consider antibiotic prophylaxis with amoxicillin for adequate oral and anaerobic coverage.  I anticipate the swelling will settle down promptly although there may still be some ecchymotic discoloration for several weeks. No role for an ice pack at this point. Antibiotic ointment to the external laceration.   Flo Shanks 01/06/2012, 6:41 PM

## 2012-01-06 NOTE — Progress Notes (Signed)
ANTIBIOTIC CONSULT NOTE - INITIAL  Pharmacy Consult for zosyn Indication: lip swelling with laceration  No Known Allergies  Patient Measurements: Height: 5\' 4"  (162.6 cm) Weight: 122 lb 12.7 oz (55.7 kg) IBW/kg (Calculated) : 54.7    Vital Signs: Temp: 98.7 F (37.1 C) (10/08 0928) Temp src: Oral (10/08 0928) BP: 170/87 mmHg (10/08 0928) Pulse Rate: 88  (10/08 0928) Intake/Output from previous day:   Intake/Output from this shift:    Labs:  Basename 01/06/12 0618 01/05/12 0442 01/04/12 1607 01/04/12 0705  WBC 9.0 13.3* -- 10.6*  HGB 7.5* 7.7* -- 8.6*  PLT 205 220 -- 236  LABCREA -- -- -- --  CREATININE 0.98 1.06 1.07 --   Estimated Creatinine Clearance: 32.9 ml/min (by C-G formula based on Cr of 0.98). No results found for this basename: VANCOTROUGH:2,VANCOPEAK:2,VANCORANDOM:2,GENTTROUGH:2,GENTPEAK:2,GENTRANDOM:2,TOBRATROUGH:2,TOBRAPEAK:2,TOBRARND:2,AMIKACINPEAK:2,AMIKACINTROU:2,AMIKACIN:2, in the last 72 hours   Microbiology: Recent Results (from the past 720 hour(s))  MRSA PCR SCREENING     Status: Normal   Collection Time   01/04/12  3:44 AM      Component Value Range Status Comment   MRSA by PCR NEGATIVE  NEGATIVE Final     Medical History: Past Medical History  Diagnosis Date  . Cancer   . Stroke   . Diabetes mellitus   . Dementia     Medications:  Prescriptions prior to admission  Medication Sig Dispense Refill  . acetaminophen (TYLENOL) 325 MG tablet Take 650 mg by mouth every 4 (four) hours as needed. For pain      . aspirin EC 325 MG tablet Take 325 mg by mouth daily.      Marland Kitchen atorvastatin (LIPITOR) 40 MG tablet Take 40 mg by mouth daily.      . citalopram (CELEXA) 20 MG tablet Take 20 mg by mouth every morning.      . clopidogrel (PLAVIX) 75 MG tablet Take 75 mg by mouth daily.      . ferrous sulfate 325 (65 FE) MG tablet Take 325 mg by mouth 2 (two) times daily.      . hydrALAZINE (APRESOLINE) 25 MG tablet Take 25 mg by mouth 3 (three) times  daily.      Marland Kitchen lactobacillus acidophilus (BACID) TABS Take 2 tablets by mouth every 12 (twelve) hours.      . metoprolol (LOPRESSOR) 100 MG tablet Take 100 mg by mouth daily.      Marland Kitchen omeprazole (PRILOSEC) 40 MG capsule Take 40 mg by mouth daily.      . polycarbophil (FIBERCON) 625 MG tablet Take 625 mg by mouth 3 (three) times daily.      . polyethylene glycol (MIRALAX / GLYCOLAX) packet Take 17 g by mouth every other day.      Marland Kitchen QUEtiapine (SEROQUEL) 25 MG tablet Take 75 mg by mouth at bedtime.      . rivastigmine (EXELON) 1.5 MG capsule Take 1.5 mg by mouth 2 (two) times daily.      Marland Kitchen senna (SENOKOT) 8.6 MG TABS Take 1 tablet by mouth daily.       Assessment: 78 yo lady s/p acute stroke with hemorrhagic transformation to start zosyn for lip swelling with laceration.  Goal of Therapy:  Eradication of infection  Plan:  Zosyn 3.375 gm IV q8 hours. F/u clinical course.  Amy Martin 01/06/2012,11:58 AM

## 2012-01-06 NOTE — Progress Notes (Signed)
Physical Therapy Treatment Patient Details Name: Amy Martin MRN: [id number] DOB: [birth year] Today's Date: 01/06/2012 Time: 4098-1191 PT Time Calculation (min): 18 min  PT Assessment / Plan / Recommendation Comments on Treatment Session  Patient with overall flat affect and nonverbal. When asked if patient was tired with ambulation she nodded yes. Patient able to ambulate to bathroom and back. Patient with significant gait disturbances    Follow Up Recommendations  Post acute inpatient     Does the patient have the potential to tolerate intense rehabilitation  No, Recommend SNF  Barriers to Discharge        Equipment Recommendations  None recommended by PT;None recommended by OT    Recommendations for Other Services    Frequency Min 4X/week   Plan Discharge plan remains appropriate;Frequency remains appropriate    Precautions / Restrictions Precautions Precautions: Fall Restrictions Weight Bearing Restrictions: No   Pertinent Vitals/Pain     Mobility  Bed Mobility Supine to Sit: 4: Min assist Details for Bed Mobility Assistance: Assistance to elevates shoulders up out of bed.  Transfers Sit to Stand: 3: Mod assist Stand to Sit: 3: Mod assist Details for Transfer Assistance: A to ensure balance and safety. Patient unable to shift weight forward for standing and required manaual facilitation Ambulation/Gait Ambulation/Gait Assistance: 1: +2 Total assist Ambulation/Gait: Patient Percentage: 60% Ambulation Distance (Feet): 16 Feet Assistive device: None Ambulation/Gait Assistance Details: Attempted RW. Patient unable to grasp with L UE. Patient able to shift weight a facilitate steppage with both LEs. Patient with heavy right lean this session. One large LOB posteriorly with transfer Gait Pattern: Shuffle;Trunk flexed Modified Rankin (Stroke Patients Only) Pre-Morbid Rankin Score: Moderate disability Modified Rankin: Moderately severe disability    Exercises     PT  Diagnosis:    PT Problem List:   PT Treatment Interventions:     PT Goals Acute Rehab PT Goals PT Goal: Supine/Side to Sit - Progress: Progressing toward goal PT Goal: Sit to Stand - Progress: Progressing toward goal PT Goal: Stand to Sit - Progress: Progressing toward goal PT Transfer Goal: Bed to Chair/Chair to Bed - Progress: Progressing toward goal PT Goal: Ambulate - Progress: Progressing toward goal  Visit Information  Last PT Received On: 01/06/12 Assistance Needed: +2 (for safety and ambulation)    Subjective Data      Cognition  Overall Cognitive Status: History of cognitive impairments - further impaired Area of Impairment: Following commands Arousal/Alertness: Awake/alert Orientation Level: Time Behavior During Session: Flat affect Following Commands: Follows multi-step commands inconsistently    Insurance risk surveyor Sitting - Balance Support: Bilateral upper extremity supported;Feet supported Static Sitting - Level of Assistance: 5: Stand by assistance Static Standing Balance Static Standing - Balance Support: Bilateral upper extremity supported Static Standing - Level of Assistance: 1: +2 Total assist  End of Session PT - End of Session Equipment Utilized During Treatment: Gait belt Activity Tolerance: Patient limited by fatigue Patient left: in chair;with call bell/phone within reach Nurse Communication: Mobility status   GP     Fredrich Birks 01/06/2012, 11:53 AM 01/06/2012 Fredrich Birks PTA [phone] pager [phone] office

## 2012-01-06 NOTE — Progress Notes (Signed)
Name: Amy Martin MRN: [id number] DOB: February 09, 1920    LOS: 3  Referring Provider:  Caryl Pina Reason for Referral:  Stroke s/p tPA  PULMONARY / CRITICAL CARE MEDICINE  HPI:  Amy Martin is a 78 y/o woman with history of dementia, anemia 2/2 colonic polyp, and recent history of "mini strokes" who presented to the [facility] ED via EMS after a fall.  She was in the process of being admitted to the hospitalist for altered mental status when it was noted that she had a new dense left hemi-paresis.  Code stroke was called and after evaluation by the on call neurologist, it was felt that the benefits of tPA outweighed the risks of bleeding and tPA was administered.  CCM was called for admission to the NSICU for monitoring.  Prior to admission she developed bleeding from a laceration on her lip sustained during her fall, as well as bleeding into the soft tissue of her left eye and chest where she had sustained bruising from her fall.  Her family expressed that her wishes are that she would not want to be intubated or rescitated in the event of pulmonary or cardiac arrest to both the neurologist and the ED staff. Now on the regular floor, no acute complaints.   Vital Signs: Temp:  [98.3 F (36.8 C)-98.9 F (37.2 C)] 98.7 F (37.1 C) (10/08 0928) Pulse Rate:  [68-89] 88  (10/08 0928) Resp:  [14-19] 19  (10/08 0928) BP: (141-201)/(54-87) 170/87 mmHg (10/08 0928) SpO2:  [100 %] 100 % (10/08 0928)  Physical Examination: General:  Laying on stretcher, somnolent but does respond to voice Neuro:  Left facial droop, strength 5/5 on right, varying on left from 1-4/5 depending on time of exam HEENT:  Swelling of the Lip and L eye leads Neck:  supple Cardiovascular:  NRRR, II/VI systolic murmur at LUSB Lungs:  RL chest crackles  Abdomen:  Soft, NTND, +BS Musculoskeletal:  No joint abnormalities Skin:  Bruising and soft tissue swelling of left eye, sternum and left shoulder  Active Problems:  CVA  (cerebral infarction)   ASSESSMENT AND PLAN  PULMONARY No results found for this basename: PHART:5,PCO2:5,PCO2ART:5,PO2ART:5,HCO3:5,O2SAT:5 in the last 168 hours Ventilator Settings:   CXR:  none ETT:  None  Ct Head Wo Contrast  01/05/2012 1. No new discrete areas of intracranial or extra-axial hemorrhage. 2. Grossly unchanged trace amount of intraventricular hemorrhage. No hydrocephalus or midline shift. 3. Previously described small anterior falcine subdural hematoma is not well depicted on this motion degraded examination. 4. Interval decrease in now small subcutaneous hematoma about the left forehead. No displaced calvarial fracture. 5. Extensive atrophy and microvascular ischemic disease.  Ct Head Wo Contrast  01/04/2012 1. Small amount of intraventricular hemorrhage in the occipital horn of the left lateral ventricle. No hydrocephalus. 2. Interval formation of a small anterior falcine subdural hematoma. 3. Left facial and forehead scalp hematoma. I discussed these findings by telephone with Dr. Onalee Hua at 7:14 a.m. on 01/04/2012.  Ct Head Wo Contrast  01/04/2012 * 1. Negative for bleed or other acute intracranial process. 2. Atrophy and nonspecific white matter changes.  Ct Head Wo Contrast  01/03/2012 1. Negative for bleed or other acute intracranial process. 2. Atrophy and nonspecific white matter changes. Right frontal scalp soft tissue swelling.  CT MAXILLOFACIAL  01/03/2012 1. Negative for fracture or other acute bony abnormality.  CT CERVICAL SPINE  01/03/2012 1. Negative for fracture or other acute bony abnormality. 2. Multilevel degenerative changes as enumerated  above. 3. Bilateral carotid bifurcation plaque.  Dg Chest Port 1 View  01/05/2012 No acute cardiopulmonary disease Tortuous aorta with atherosclerotic vascular calcifications.  MRI of the brain  Moderate size acute right hemispheric infarct as detailed above.  Broad-based right parietal - occipital - posterior  right temporal  subdural hematoma measuring up to 4.4 mm.  Sclerotic linear structure through the base of the dens. MR of  the cervical spine with STIR sequence can be obtained to see if  there is a CT occult injury at this level.  Multiple areas of blood breakdown products throughout the basal  ganglia and thalami and less so left frontal lobe may reflect  changes of the prior episode hemorrhagic ischemia although small  shear type injuries could contribute to this appearance.  Prominent small vessel disease type changes.  MRI Spine  1. No fracture at C2.  2. Moderate left and mild right foraminal stenosis at C2-3.  3. Moderate central and bilateral foraminal stenosis at C3-4. This  is the worst level spondylosis.  4. Mild to moderate central and bilateral foraminal stenosis at C4-  5.  5. Mild to moderate central canal stenosis at C5-6.  6. Mild foraminal narrowing bilaterally at C6-7.  2D Echocardiogram Left ventricle: The cavity size was normal. Systolicfunction was normal. The estimated ejection fraction wasin the range of 60% to 65%. Wall motion was normal; therewere no regional wall motion abnormalities. Doppler parameters are consistent with abnormal left ventricularrelaxation (grade 1 diastolic dysfunction). - Aortic valve: Trivial regurgitation. - Mitral valve: Moderately calcified annulus. Mild regurgitation. - Left atrium: The atrium was moderately dilated. - Pulmonary arteries: Systolic pressure was mildly increased.Impressions:- No cardiac source of embolism was identified, but cannotbe ruled out on the basis of this examination.  EEG1) Generalized irregular slow activity.  Clinical Interpretation: This abnormal EEG is consistent with a mild non-specific cerebral dysfunction. There was no seizure or seizure predisposition recorded on this study.  Carotid Doppler No evidence of hemodynamically significant internal carotid artery stenosis. Vertebral artery flow is antegrade.    EKG normal sinus rhythm.     Lab 01/06/12 0618 01/05/12 0442 01/04/12 1607 01/04/12 0705 01/03/12 2023  NA 142 141 142 139 136  K 4.0 4.2 -- -- --  CL 112 111 111 105 103  CO2 21 21 23 25 22   BUN 13 18 23  25* 25*  CREATININE 0.98 1.06 1.07 1.14* 1.21*  CALCIUM 9.2 9.3 9.6 9.2 9.6  MG 1.7 -- -- -- --  PHOS 2.4 -- -- -- --    Lab 01/05/12 0442 01/03/12 2023  AST 14 12  ALT 7 7  ALKPHOS 82 83  BILITOT 0.4 0.2*  PROT 5.6* 6.0  ALBUMIN 2.6* 2.5*    Lab 01/06/12 0618 01/05/12 0442 01/04/12 0705 01/04/12 0033 01/03/12 2023  HGB 7.5* 7.7* 8.6* -- 9.8*  HCT 22.8* 23.3* 26.7* -- 29.4*  PLT 205 220 236 264 235  INR -- -- -- 1.13 --  APTT -- -- -- -- --   Current Facility-Administered Medications  Medication Dose Route Frequency Provider Last Rate Last Dose  . 0.9 %  sodium chloride infusion   Intravenous Continuous Alyson Reedy, MD 50 mL/hr at 01/06/12 0439    . 0.9 %  sodium chloride infusion   Intravenous Continuous Cathlyn Parsons, PA-C      . acetaminophen (TYLENOL) tablet 650 mg  650 mg Oral Q4H PRN Storm Frisk, MD       Or  .  acetaminophen (TYLENOL) suppository 650 mg  650 mg Rectal Q4H PRN Storm Frisk, MD   650 mg at 01/06/12 1610  . antiseptic oral rinse (BIOTENE) solution 15 mL  15 mL Mouth Rinse q12n4p Storm Frisk, MD   15 mL at 01/05/12 1600  . aspirin suppository 150 mg  150 mg Rectal q morning - 10a Caryl Pina, MD   150 mg at 01/05/12 0433  . chlorhexidine (PERIDEX) 0.12 % solution 15 mL  15 mL Mouth Rinse BID Storm Frisk, MD   15 mL at 01/05/12 2129  . dextrose 50 % solution 25-50 mL  25-50 mL Intravenous PRN Dorise Hiss Deterding, MD      . dextrose 50 % solution           . haloperidol lactate (HALDOL) injection 2 mg  2 mg Intravenous Q6H PRN Lonia Farber, MD   2 mg at 01/04/12 2007  . insulin aspart (novoLOG) injection 1-3 Units  1-3 Units Subcutaneous Q4H Tammy S Parrett, NP   1 Units at 01/04/12 1950  . labetalol (NORMODYNE)  tablet 100 mg  100 mg Oral TID Alyson Reedy, MD   100 mg at 01/06/12 1054  . labetalol (NORMODYNE,TRANDATE) injection 10 mg  10 mg Intravenous Q10 min PRN Storm Frisk, MD   10 mg at 01/05/12 1232  . ondansetron (ZOFRAN) injection 4 mg  4 mg Intravenous Q6H PRN Storm Frisk, MD      . pantoprazole (PROTONIX) injection 40 mg  40 mg Intravenous QHS Storm Frisk, MD   40 mg at 01/05/12 2211    A:  S/p acute stroke s/p tPA with hemorrhagic transformation   - cont routine neuro checks - repeat CT head  with acute worsening in mental status  - Neurology following -MRI negative for cervical spine fracture   A: Hypertension P:  - Will need PO labetalol once patient is able to take PO.  A:  History of polyp, at risk for swallow dysfunction 2/2 acute stroke P:   - Follow for GI bleed - Swallow evaluation passed for honey nectar  A:  Anemia P:  - History of bleeding polyp that has not required transfusion - Bleeding from oral laceration - Repeat CBC at 1200 -if hgb stable and no concern for GI bleed we could advance diet - Transfuse for Hgb <7  A:  AKI and hypokalemia P:   -improved - Replete electrolytes as needed - Monitor BUN/Cr daily, monitor urine output as able  A:  Currently stable on room air, at high risk for aspiration given oral bleeding and altered mental status P:   - Pt is DNR/DNI - Supplemental O2 as needed to maintain sats >92% - KVO IVF to avoid fluid overload.  L lip swelling with laceration we will ask ENT to see and evaluate  Start Zosyn   RLL crackles  we will repeat CXR   We will transfer to triad hospitalist group. Please call with questions.   Vanetta Mulders, MD  [phone]

## 2012-01-06 NOTE — Progress Notes (Signed)
Called lab to have CBC drawn due at 1200. Lab will send someone to draw. Thomas Hoff

## 2012-01-07 ENCOUNTER — Inpatient Hospital Stay (HOSPITAL_COMMUNITY): Payer: Medicare Other

## 2012-01-07 DIAGNOSIS — D649 Anemia, unspecified: Secondary | ICD-10-CM

## 2012-01-07 LAB — CBC
Hemoglobin: 7.7 g/dL — ABNORMAL LOW (ref 12.0–15.0)
MCH: 27.9 pg (ref 26.0–34.0)
MCHC: 33 g/dL (ref 30.0–36.0)
MCV: 84.4 fL (ref 78.0–100.0)

## 2012-01-07 LAB — GLUCOSE, CAPILLARY
Glucose-Capillary: 118 mg/dL — ABNORMAL HIGH (ref 70–99)
Glucose-Capillary: 131 mg/dL — ABNORMAL HIGH (ref 70–99)

## 2012-01-07 MED ORDER — POLYVINYL ALCOHOL 1.4 % OP SOLN
1.0000 [drp] | OPHTHALMIC | Status: DC | PRN
  Filled 2012-01-07: qty 15

## 2012-01-07 MED ORDER — ENSURE COMPLETE PO LIQD
237.0000 mL | Freq: Two times a day (BID) | ORAL | Status: DC
  Administered 2012-01-08 – 2012-01-12 (×5): 237 mL via ORAL

## 2012-01-07 MED ORDER — ASPIRIN 81 MG PO CHEW
81.0000 mg | CHEWABLE_TABLET | Freq: Every day | ORAL | Status: DC
  Administered 2012-01-07 – 2012-01-12 (×6): 81 mg via ORAL
  Filled 2012-01-07 (×6): qty 1

## 2012-01-07 NOTE — Progress Notes (Signed)
Subjective: No acute events.  Nursing does not report any further evidence of melena.  Objective: Vital signs in last 24 hours: Temp:  [97.9 F (36.6 C)-98.7 F (37.1 C)] 98.1 F (36.7 C) (10/09 0628) Pulse Rate:  [68-88] 73  (10/09 0628) Resp:  [17-20] 18  (10/09 0628) BP: (145-170)/(58-87) 147/59 mmHg (10/09 0628) SpO2:  [100 %] 100 % (10/09 0628) Weight:  [55.1 kg (121 lb 7.6 oz)] 55.1 kg (121 lb 7.6 oz) (10/09 0628) Last BM Date: 01/06/12  Intake/Output from previous day: 10/08 0701 - 10/09 0700 In: 120 [P.O.:120] Out: -  Intake/Output this shift:    General appearance: alert and no distress GI: soft, non-tender; bowel sounds normal; no masses,  no organomegaly  Lab Results:  [facility] 01/06/12 1321 01/06/12 0618 01/05/12 0442  WBC 8.3 9.0 13.3*  HGB 7.2* 7.5* 7.7*  HCT 21.7* 22.8* 23.3*  PLT 182 205 220   BMET  Basename 01/06/12 0618 01/05/12 0442 01/04/12 1607  NA 142 141 142  K 4.0 4.2 4.4  CL 112 111 111  CO2 21 21 23   GLUCOSE 84 115* 117*  BUN 13 18 23   CREATININE 0.98 1.06 1.07  CALCIUM 9.2 9.3 9.6   LFT  Basename 01/05/12 0442  PROT 5.6*  ALBUMIN 2.6*  AST 14  ALT 7  ALKPHOS 82  BILITOT 0.4  BILIDIR --  IBILI --   PT/INR No results found for this basename: LABPROT:2,INR:2 in the last 72 hours Hepatitis Panel No results found for this basename: HEPBSAG,HCVAB,HEPAIGM,HEPBIGM in the last 72 hours C-Diff No results found for this basename: CDIFFTOX:3 in the last 72 hours Fecal Lactopherrin No results found for this basename: FECLLACTOFRN in the last 72 hours  Studies/Results: Mr Brain Wo Contrast  01/05/2012  **ADDENDUM** CREATED: 01/05/2012 20:05:01  Results discussed with Dr. Otelia Limes 10 /09/2011 8:05 p.m.  **END ADDENDUM** SIGNED BY: Almedia Balls. Constance Goltz, M.D.   01/05/2012  *RADIOLOGY REPORT*  Clinical Data: Post fall.  Left hemiparesis.  Post t-PA 2 days ago.  MRI HEAD WITHOUT CONTRAST  Technique:  Multiplanar, multiecho pulse sequences of the  brain and surrounding structures were obtained according to standard protocol without intravenous contrast.  Comparison: 01/03/2012, 01/04/2012 and 01/05/2012 CT.  No comparison MR.  Findings: Moderate size acute right hemispheric infarct involving the right opercular region, right sub insular region, right frontal lobe and junction with the right parietal lobe.  Left supraorbital subcutaneous hematoma/soft tissue swelling.  Broad-based right parietal - occipital - posterior right temporal subdural hematoma measuring up to 4.4 mm.  Small amount of dependent interventricular blood.  Multiple areas of blood breakdown products throughout the basal ganglia and thalami and less so left frontal lobe may reflect changes of the prior episode hemorrhagic ischemia although small shear type injuries could contribute to this appearance.  Prominent small vessel disease type changes.  Global atrophy without hydrocephalus.  No intracranial mass lesion noted on this unenhanced examination as seen separate from the above described findings.  Major intracranial vascular structures are patent.  Mild mucosal thickening paranasal sinuses.  Sclerotic linear structure through the base of the dens.   MR of the cervical spine with STIR sequence can be obtained to see if there is a CT occult injury at this level.  IMPRESSION: Moderate size acute right hemispheric infarct as detailed above.  Broad-based right parietal - occipital - posterior right temporal subdural hematoma measuring up to 4.4 mm.  Sclerotic linear structure through the base of the dens.   MR  of the cervical spine with STIR sequence can be obtained to see if there is a CT occult injury at this level.  Multiple areas of blood breakdown products throughout the basal ganglia and thalami and less so left frontal lobe may reflect changes of the prior episode hemorrhagic ischemia although small shear type injuries could contribute to this appearance.  Prominent small vessel disease  type changes.  Critical Value/emergent results were called by telephone at the time of interpretation on 01/05/2012 at 7:50 p.m. to Margaretha Glassing the patient's nurse pain, who verbally acknowledged these results.  Call into Dr. Agnes Lawrence,   Original Report Authenticated By: Fuller Canada, M.D.    Mr Cervical Spine Wo Contrast  01/06/2012  **ADDENDUM** CREATED: 01/06/2012 09:46:42  These results were called by telephone on 01/06/2012 at 09:45 p.m. to Annie Main, NP, who verbally acknowledged these results.  **END ADDENDUM** SIGNED BY: Chauncey Fischer, M.D.   01/06/2012  *RADIOLOGY REPORT*  Clinical Data: The dens fracture  MRI CERVICAL SPINE WITHOUT CONTRAST  Technique:  Multiplanar and multiecho pulse sequences of the cervical spine, to include the craniocervical junction and cervicothoracic junction, were obtained according to standard protocol without intravenous contrast.  Comparison: MRI of the brain 01/05/2012.  CT of the cervical spine 01/03/2012.  Findings: MRI of the cervical spine demonstrates marrow acute or healing fracture within the C2 vertebral body.  Thicker slices were utilized on the sagittal brain sequence.  The area in question on the previous study actually represents the disc space.  Craniocervical junction is within normal limits.  Moderate atrophy is again noted.  Normal signal is present within the cervical upper thoracic spinal cord.  C2-3:  Moderate left mild right foraminal stenosis secondary to uncovertebral disease.  C3-4:  Moderate central canal stenosis is present with some flattening of the cervical spine.  Moderate foraminal stenosis is present bilaterally.  C4-5:  Mild to moderate central canal stenosis is present with effacement of the ventral CSF.  Mild to moderate foraminal narrowing is present bilaterally.  C5-6:  Moderate uncovertebral spurring is present bilaterally, leading to foraminal stenosis.  Mild to moderate central canal stenosis is present with  effacement of the ventral CSF.  C6-7:  A broad-based disc osteophyte complex effaces ventral CSF. Mild foraminal stenosis is present bilaterally.  C7-T1:  No significant disc herniation or stenosis is present.  IMPRESSION:  1. No fracture at C2. 2.  Moderate left and mild right foraminal stenosis at C2-3. 3. Moderate central and bilateral foraminal stenosis at C3-4.  This is the worst level spondylosis. 4.  Mild to moderate central and bilateral foraminal stenosis at C4- 5. 5.  Mild to moderate central canal stenosis at C5-6. 6.  Mild foraminal narrowing bilaterally at C6-7.   Original Report Authenticated By: Jamesetta Orleans. MATTERN, M.D.    Dg Chest Port 1 View  01/06/2012  *RADIOLOGY REPORT*  Clinical Data: Right lower lobe crackles concerning for pneumonia  PORTABLE CHEST - 1 VIEW  Comparison: 01/05/2012  Findings: Normal heart size and vascularity.  Negative for CHF, edema, significant effusion, or pneumothorax.  Lower lung volumes accentuating the basilar vascular markings and minimal atelectasis. Atherosclerosis of the aorta.  Degenerative changes of the spine. Left axillary clips noted.  Diffuse osteopenia evident.  IMPRESSION: Low volume exam with atelectasis.   Original Report Authenticated By: Judie Petit. Ruel Favors, M.D.     Medications:  Scheduled:   . amoxicillin  500 mg Oral Q8H  . antiseptic oral rinse  15  mL Mouth Rinse q12n4p  . aspirin  150 mg Rectal q morning - 10a  . bacitracin   Topical BID  . chlorhexidine  15 mL Mouth Rinse BID  . insulin aspart  1-3 Units Subcutaneous Q4H  . labetalol  100 mg Oral TID  . pantoprazole (PROTONIX) IV  40 mg Intravenous QHS  . DISCONTD: bacitracin  1 application Topical BID  . DISCONTD: piperacillin-tazobactam (ZOSYN)  IV  3.375 g Intravenous Q8H   Continuous:   . sodium chloride 60 mL/hr (01/06/12 1134)    Assessment/Plan: 1) Anemia. 2) Melena.   No further reports of melena at this time.  HGB is relatively stable.  Plan: 1) Continue to  monitor HGB and any overt signs of bleeding. 2) No EGD at this time.  LOS: 4 days   Alishea Beaudin D 01/07/2012, 8:01 AM

## 2012-01-07 NOTE — Progress Notes (Signed)
Nutrition Follow-up  Intervention:   Ensure Complete po BID, each supplement provides 350 kcal and 13 grams of protein.   Assessment:   Pt seemed a bit confused. She reports to me that she ate most of her Breakfast. However, per RN and tech pt only ate about 25% of meal and did better with liquids than the solids. Per SLP note pt still had food in her mouth from Breakfast during her assessment.   Diet Order:  Dysphagia 1 with nectar thick liquids  Meds: Scheduled Meds:   . amoxicillin  500 mg Oral Q8H  . antiseptic oral rinse  15 mL Mouth Rinse q12n4p  . aspirin  81 mg Oral Daily  . bacitracin   Topical BID  . labetalol  100 mg Oral TID  . pantoprazole (PROTONIX) IV  40 mg Intravenous QHS  . DISCONTD: aspirin  150 mg Rectal q morning - 10a  . DISCONTD: bacitracin  1 application Topical BID  . DISCONTD: chlorhexidine  15 mL Mouth Rinse BID  . DISCONTD: insulin aspart  1-3 Units Subcutaneous Q4H  . DISCONTD: piperacillin-tazobactam (ZOSYN)  IV  3.375 g Intravenous Q8H   Continuous Infusions:   . sodium chloride 60 mL/hr (01/06/12 1134)   PRN Meds:.acetaminophen, acetaminophen, dextrose, haloperidol lactate, labetalol, ondansetron (ZOFRAN) IV  Labs:  CMP     Component Value Date/Time   NA 142 01/06/2012 0618   K 4.0 01/06/2012 0618   CL 112 01/06/2012 0618   CO2 21 01/06/2012 0618   GLUCOSE 84 01/06/2012 0618   BUN 13 01/06/2012 0618   CREATININE 0.98 01/06/2012 0618   CALCIUM 9.2 01/06/2012 0618   PROT 5.6* 01/05/2012 0442   ALBUMIN 2.6* 01/05/2012 0442   AST 14 01/05/2012 0442   ALT 7 01/05/2012 0442   ALKPHOS 82 01/05/2012 0442   BILITOT 0.4 01/05/2012 0442   GFRNONAA 49* 01/06/2012 0618   GFRAA 57* 01/06/2012 0618   CBG (last 3)   Basename 01/07/12 0400 01/07/12 0039 01/06/12 2114  GLUCAP 110* 118* 119*    Intake/Output Summary (Last 24 hours) at 01/07/12 0957 Last data filed at 01/06/12 2137  Gross per 24 hour  Intake    120 ml  Output      0 ml  Net    120 ml    Last BM: 10/8  Weight Status:  122 lbs on admission 122 lbs 10/7 121 lbs 10/9  Estimated needs:  Kcal: 1300-1500; Protein: 65-75 grams  Nutrition Dx:  Inadequate oral intake now r/t decreased appetite  AEB meal completion 25%; ongoing.  Goal:  Pt to meet >/= 90% of their estimated nutrition needs; not met.   Monitor:  Diet tolerance, PO intake   Kendell Bane RD, LDN, CNSC [phone] Pager [phone] After Hours Pager

## 2012-01-07 NOTE — Progress Notes (Signed)
Physical Therapy Treatment Patient Details Name: Amy Martin MRN: [id number] DOB: 12/23/1919 Today's Date: 01/07/2012 Time: 1000-1016 PT Time Calculation (min): 16 min  PT Assessment / Plan / Recommendation Comments on Treatment Session  Noted patients Hgb was 7.2 this morning therefore only transferred patient and did not attempt ambulation this session. Patient declined when asked if she had any feelings of lightheadedness or dizziness. Will await and see if Hgb has increased to attempt ambulation next session    Follow Up Recommendations  Post acute inpatient     Does the patient have the potential to tolerate intense rehabilitation  No, Recommend SNF  Barriers to Discharge        Equipment Recommendations  None recommended by PT;None recommended by OT    Recommendations for Other Services    Frequency Min 4X/week   Plan Discharge plan remains appropriate;Frequency remains appropriate    Precautions / Restrictions Precautions Precautions: Fall Restrictions Weight Bearing Restrictions: No   Pertinent Vitals/Pain     Mobility  Bed Mobility Supine to Sit: 3: Mod assist Details for Bed Mobility Assistance: A to position LEs out of bed and to elevate shoulders into upright sitting position. Patient was given cues to reach and pull up using rails but patient having difficulty attempting Transfers Transfers: Stand Pivot Transfers Sit to Stand: 3: Mod assist;From bed;With upper extremity assist Stand to Sit: 3: Mod assist;With upper extremity assist;To chair/3-in-1 Stand Pivot Transfers: 1: +1 Total assist Details for Transfer Assistance: Patient needing A to initate stand and ensure balance over BOS as she had a tendency for a posterior lean in standing. Patient required manual facilitation to flex hips and knees with sitting as she becomes very rigid. Patient unable to take steps this sesssion and was transferred to the recliner. Patient  needed facilitation at hips into the  recliner.  Ambulation/Gait Ambulation/Gait Assistance: Not tested (comment)    Exercises     PT Diagnosis:    PT Problem List:   PT Treatment Interventions:     PT Goals Acute Rehab PT Goals PT Goal: Supine/Side to Sit - Progress: Progressing toward goal PT Goal: Sit to Stand - Progress: Progressing toward goal PT Goal: Stand to Sit - Progress: Progressing toward goal PT Transfer Goal: Bed to Chair/Chair to Bed - Progress: Progressing toward goal  Visit Information  Last PT Received On: 01/07/12 Assistance Needed: +2 (for safety and ambulation)    Subjective Data      Cognition  Overall Cognitive Status: History of cognitive impairments - further impaired Area of Impairment: Following commands;Safety/judgement Arousal/Alertness: Awake/alert Orientation Level: Disoriented to;Time Behavior During Session: [facility] for tasks performed Following Commands: Follows one step commands inconsistently Safety/Judgement: Impulsive;Decreased safety judgement for tasks assessed Safety/Judgement - Other Comments: Patient attempt to stand up out of bed without assistance    Balance  Static Sitting Balance Static Sitting - Balance Support: Bilateral upper extremity supported;Feet supported Static Sitting - Level of Assistance: 5: Stand by assistance Static Standing Balance Static Standing - Balance Support: Bilateral upper extremity supported Static Standing - Level of Assistance: 1: +1 Total assist  End of Session PT - End of Session Equipment Utilized During Treatment: Gait belt Activity Tolerance: Patient tolerated treatment well;Treatment limited secondary to medical complications (Comment) (Hgb 7.2) Patient left: in chair;with call bell/phone within reach   GP     Amy Martin, Amy Martin 01/07/2012, 1:48 PM 01/07/2012 Amy Martin PTA [phone] pager [phone] office

## 2012-01-07 NOTE — Progress Notes (Signed)
Name: Floy Angert MRN: [id number] DOB: [birth year]    LOS: 4  [facility]  HPI:  Ms. Venne is a 78 y/o woman with history of dementia, anemia 2/2 colonic polyp, and recent history of "mini strokes" who presented to the [facility] ED via EMS after a fall.  She was in the process of being admitted to the hospitalist for altered mental status when it was noted that she had a new dense left hemi-paresis.  Code stroke was called and after evaluation by the on call neurologist, it was felt that the benefits of tPA outweighed the risks of bleeding and tPA was administered.  CCM was called for admission to the NSICU for monitoring.  Prior to admission she developed bleeding from a laceration on her lip sustained during her fall, as well as bleeding into the soft tissue of her left eye and chest where she had sustained bruising from her fall.  Her family expressed that her wishes are that she would not want to be intubated or rescitated in the event of pulmonary or cardiac arrest to both the neurologist and the ED staff. Now on the regular floor, no acute complaints.   A/P  A:  S/p acute stroke s/p tPA with hemorrhagic transformation   - cont routine neuro checks   A: Hypertension P:  On PO labetalol   A:  History of polyp, at risk for swallow dysfunction 2/2 acute stroke P:   - Follow for GI bleed - Swallow evaluation passed for honey nectar  A:  Anemia P:  - History of bleeding polyp that has not required transfusion - most likely due to bleeding from oral laceration   A:  AKI and hypokalemia P:   Resolved after iv fluids.   A:  Currently stable on room air, at high risk for aspiration given oral bleeding and altered mental status P:   - Pt is DNR/DNI - Supplemental O2 as needed to maintain sats >92% - KVO IVF to avoid fluid overload.  L lip swelling    RLL crackles  Due to atelectasis       Vital Signs: Temp:  [97.9 F (36.6 C)-98.7 F (37.1 C)] 98.1 F (36.7  C) (10/09 0628) Pulse Rate:  [68-88] 73  (10/09 0628) Resp:  [17-20] 18  (10/09 0628) BP: (145-170)/(58-87) 147/59 mmHg (10/09 0628) SpO2:  [100 %] 100 % (10/09 0628) Weight:  [55.1 kg (121 lb 7.6 oz)] 55.1 kg (121 lb 7.6 oz) (10/09 4098)  Physical Examination:   Active Problems:  CVA (cerebral infarction)  Anemia  Lip swelling  AKI (acute kidney injury)  HTN (hypertension)  PROCEDURES  Ct Head Wo Contrast  01/05/2012 1. No new discrete areas of intracranial or extra-axial hemorrhage. 2. Grossly unchanged trace amount of intraventricular hemorrhage. No hydrocephalus or midline shift. 3. Previously described small anterior falcine subdural hematoma is not well depicted on this motion degraded examination. 4. Interval decrease in now small subcutaneous hematoma about the left forehead. No displaced calvarial fracture. 5. Extensive atrophy and microvascular ischemic disease.  Ct Head Wo Contrast  01/04/2012 1. Small amount of intraventricular hemorrhage in the occipital horn of the left lateral ventricle. No hydrocephalus. 2. Interval formation of a small anterior falcine subdural hematoma. 3. Left facial and forehead scalp hematoma. I discussed these findings by telephone with Dr. Onalee Hua at 7:14 a.m. on 01/04/2012.  Ct Head Wo Contrast  01/04/2012 * 1. Negative for bleed or other acute intracranial process. 2. Atrophy and nonspecific white  matter changes.  Ct Head Wo Contrast  01/03/2012 1. Negative for bleed or other acute intracranial process. 2. Atrophy and nonspecific white matter changes. Right frontal scalp soft tissue swelling.  CT MAXILLOFACIAL  01/03/2012 1. Negative for fracture or other acute bony abnormality.  CT CERVICAL SPINE  01/03/2012 1. Negative for fracture or other acute bony abnormality. 2. Multilevel degenerative changes as enumerated above. 3. Bilateral carotid bifurcation plaque.  Dg Chest Port 1 View  01/05/2012 No acute cardiopulmonary disease Tortuous  aorta with atherosclerotic vascular calcifications.  MRI of the brain  Moderate size acute right hemispheric infarct as detailed above.  Broad-based right parietal - occipital - posterior right temporal  subdural hematoma measuring up to 4.4 mm.  Sclerotic linear structure through the base of the dens. MR of  the cervical spine with STIR sequence can be obtained to see if  there is a CT occult injury at this level.  Multiple areas of blood breakdown products throughout the basal  ganglia and thalami and less so left frontal lobe may reflect  changes of the prior episode hemorrhagic ischemia although small  shear type injuries could contribute to this appearance.  Prominent small vessel disease type changes.  MRI Spine  1. No fracture at C2.  2. Moderate left and mild right foraminal stenosis at C2-3.  3. Moderate central and bilateral foraminal stenosis at C3-4. This  is the worst level spondylosis.  4. Mild to moderate central and bilateral foraminal stenosis at C4-  5.  5. Mild to moderate central canal stenosis at C5-6.  6. Mild foraminal narrowing bilaterally at C6-7.  2D Echocardiogram Left ventricle: The cavity size was normal. Systolicfunction was normal. The estimated ejection fraction wasin the range of 60% to 65%. Wall motion was normal; therewere no regional wall motion abnormalities. Doppler parameters are consistent with abnormal left ventricularrelaxation (grade 1 diastolic dysfunction). - Aortic valve: Trivial regurgitation. - Mitral valve: Moderately calcified annulus. Mild regurgitation. - Left atrium: The atrium was moderately dilated. - Pulmonary arteries: Systolic pressure was mildly increased.Impressions:- No cardiac source of embolism was identified, but cannotbe ruled out on the basis of this examination.  EEG1) Generalized irregular slow activity.  Clinical Interpretation: This abnormal EEG is consistent with a mild non-specific cerebral dysfunction. There was no  seizure or seizure predisposition recorded on this study.  Carotid Doppler No evidence of hemodynamically significant internal carotid artery stenosis. Vertebral artery flow is antegrade.  EKG normal sinus rhythm.     Lab 01/06/12 0618 01/05/12 0442 01/04/12 1607 01/04/12 0705 01/03/12 2023  NA 142 141 142 139 136  K 4.0 4.2 -- -- --  CL 112 111 111 105 103  CO2 21 21 23 25 22   BUN 13 18 23  25* 25*  CREATININE 0.98 1.06 1.07 1.14* 1.21*  CALCIUM 9.2 9.3 9.6 9.2 9.6  MG 1.7 -- -- -- --  PHOS 2.4 -- -- -- --    Lab 01/05/12 0442 01/03/12 2023  AST 14 12  ALT 7 7  ALKPHOS 82 83  BILITOT 0.4 0.2*  PROT 5.6* 6.0  ALBUMIN 2.6* 2.5*    Lab 01/06/12 1321 01/06/12 0618 01/05/12 0442 01/04/12 0705 01/04/12 0033 01/03/12 2023  HGB 7.2* 7.5* 7.7* 8.6* -- 9.8*  HCT 21.7* 22.8* 23.3* 26.7* -- 29.4*  PLT 182 205 220 236 264 --  INR -- -- -- -- 1.13 --  APTT -- -- -- -- -- --       . amoxicillin  500 mg Oral  Q8H  . antiseptic oral rinse  15 mL Mouth Rinse q12n4p  . aspirin  81 mg Oral Daily  . bacitracin   Topical BID  . chlorhexidine  15 mL Mouth Rinse BID  . insulin aspart  1-3 Units Subcutaneous Q4H  . labetalol  100 mg Oral TID  . pantoprazole (PROTONIX) IV  40 mg Intravenous QHS  . DISCONTD: aspirin  150 mg Rectal q morning - 10a  . DISCONTD: bacitracin  1 application Topical BID  . DISCONTD: piperacillin-tazobactam (ZOSYN)  IV  3.375 g Intravenous Q8H     Johnmichael Melhorn [place], MD  [phone]

## 2012-01-07 NOTE — Progress Notes (Signed)
Speech Language Pathology Dysphagia Treatment Patient Details Name: Amy Martin MRN: [id number] DOB: 05-Jul-1919 Today's Date: 01/07/2012 Time: 9562-1308 SLP Time Calculation (min): 24 min  Assessment / Plan / Recommendation Clinical Impression  Patient appears to be tolerating purees with nectar thick liquid without overt s/s of aspiration.  Patient did have pocketed material in mouth from breakfast and requires oral care after po's to reduce aspiration risks as well as infection of lip.  Discussed with RN and CNA.  Patient reports that she no longer if feeling hungry and feels that she is able to take enough po's by mouth.    Diet Recommendation       SLP Plan     Pertinent Vitals/Pain n/a   Swallowing Goals  SLP Swallowing Goals Swallow Study Goal #1 - Progress: Progressing toward goal Patient will utilize recommended strategies during swallow to increase swallowing safety with: Maximal cueing;Maximum assistance Swallow Study Goal #2 - Progress: Met Goal #3: Patient will maintain functional LOA and correctly follow one to  two step verbal directives to consume diagnostic PO trials of various consistencies to determine safest, appropriate PO diet .  Swallow Study Goal #3 - Progress: Met  General Temperature Spikes Noted: No Respiratory Status: Room air Behavior/Cognition: Alert;Cooperative;Pleasant mood Oral Cavity - Dentition: Edentulous (Laceration and swelling of left upper lip.) Patient Positioning: Upright in chair  Oral Cavity - Oral Hygiene Does patient have any of the following "at risk" factors?: Lips - dry, cracked;Mucous Membranes - reddened Brush patient's teeth BID with toothbrush (using toothpaste with fluoride): Yes Patient is HIGH RISK - Oral Care Protocol followed (see row info): Yes Patient is AT RISK - Oral Care Protocol followed (see row info): Yes Patient is mechanically ventilated, follow VAP prevention protocol for oral care: Oral care provided every 4  hours   Dysphagia Treatment Treatment focused on: Patient/family/caregiver education;Skilled observation of diet tolerance Treatment Methods/Modalities: Skilled observation (Oral care provided.) Patient observed directly with PO's: Yes Type of PO's observed: Nectar-thick liquids Feeding: Needs assist Liquids provided via: Teaspoon Oral Phase Signs & Symptoms: Prolonged oral phase (Continues to require verbal cues to close lips around spoon.) Pharyngeal Phase Signs & Symptoms:  (Improved) Type of cueing: Verbal;Tactile Amount of cueing: Moderate   GO     Maryjo Rochester T 01/07/2012, 1:50 PM

## 2012-01-08 DIAGNOSIS — I10 Essential (primary) hypertension: Secondary | ICD-10-CM

## 2012-01-08 MED ORDER — PANTOPRAZOLE SODIUM 40 MG PO TBEC
40.0000 mg | DELAYED_RELEASE_TABLET | Freq: Every day | ORAL | Status: DC
  Administered 2012-01-08 – 2012-01-12 (×5): 40 mg via ORAL
  Filled 2012-01-08 (×5): qty 1

## 2012-01-08 NOTE — Progress Notes (Signed)
Subjective: No acute events.  Objective: Vital signs in last 24 hours: Temp:  [97.5 F (36.4 C)-98.6 F (37 C)] 98.4 F (36.9 C) (10/10 0600) Pulse Rate:  [72-94] 89  (10/10 0600) Resp:  [17-18] 18  (10/10 0600) BP: (153-175)/(53-73) 157/73 mmHg (10/10 0600) SpO2:  [98 %-100 %] 98 % (10/10 0600) Weight:  [53.7 kg (118 lb 6.2 oz)-54.9 kg (121 lb 0.5 oz)] 53.7 kg (118 lb 6.2 oz) (10/10 0600) Last BM Date: 01/06/12  Intake/Output from previous day: 10/09 0701 - 10/10 0700 In: 2066 [P.O.:240; I.V.:1826] Out: -  Intake/Output this shift:    General appearance: sleepy GI: soft, non-tender; bowel sounds normal; no masses,  no organomegaly  Lab Results:  Basename 01/07/12 1041 01/06/12 1321 01/06/12 0618  WBC 6.7 8.3 9.0  HGB 7.7* 7.2* 7.5*  HCT 23.3* 21.7* 22.8*  PLT PLATELET CLUMPS NOTED ON SMEAR, COUNT APPEARS ADEQUATE 182 205   BMET  Basename 01/06/12 0618  NA 142  K 4.0  CL 112  CO2 21  GLUCOSE 84  BUN 13  CREATININE 0.98  CALCIUM 9.2   LFT No results found for this basename: PROT,ALBUMIN,AST,ALT,ALKPHOS,BILITOT,BILIDIR,IBILI in the last 72 hours PT/INR No results found for this basename: LABPROT:2,INR:2 in the last 72 hours Hepatitis Panel No results found for this basename: HEPBSAG,HCVAB,HEPAIGM,HEPBIGM in the last 72 hours C-Diff No results found for this basename: CDIFFTOX:3 in the last 72 hours Fecal Lactopherrin No results found for this basename: FECLLACTOFRN in the last 72 hours  Studies/Results: Mr Cervical Spine Wo Contrast  01/06/2012  **ADDENDUM** CREATED: 01/06/2012 09:46:42  These results were called by telephone on 01/06/2012 at 09:45 p.m. to Annie Main, NP, who verbally acknowledged these results.  **END ADDENDUM** SIGNED BY: Chauncey Fischer, M.D.   01/06/2012  *RADIOLOGY REPORT*  Clinical Data: The dens fracture  MRI CERVICAL SPINE WITHOUT CONTRAST  Technique:  Multiplanar and multiecho pulse sequences of the cervical spine, to include  the craniocervical junction and cervicothoracic junction, were obtained according to standard protocol without intravenous contrast.  Comparison: MRI of the brain 01/05/2012.  CT of the cervical spine 01/03/2012.  Findings: MRI of the cervical spine demonstrates marrow acute or healing fracture within the C2 vertebral body.  Thicker slices were utilized on the sagittal brain sequence.  The area in question on the previous study actually represents the disc space.  Craniocervical junction is within normal limits.  Moderate atrophy is again noted.  Normal signal is present within the cervical upper thoracic spinal cord.  C2-3:  Moderate left mild right foraminal stenosis secondary to uncovertebral disease.  C3-4:  Moderate central canal stenosis is present with some flattening of the cervical spine.  Moderate foraminal stenosis is present bilaterally.  C4-5:  Mild to moderate central canal stenosis is present with effacement of the ventral CSF.  Mild to moderate foraminal narrowing is present bilaterally.  C5-6:  Moderate uncovertebral spurring is present bilaterally, leading to foraminal stenosis.  Mild to moderate central canal stenosis is present with effacement of the ventral CSF.  C6-7:  A broad-based disc osteophyte complex effaces ventral CSF. Mild foraminal stenosis is present bilaterally.  C7-T1:  No significant disc herniation or stenosis is present.  IMPRESSION:  1. No fracture at C2. 2.  Moderate left and mild right foraminal stenosis at C2-3. 3. Moderate central and bilateral foraminal stenosis at C3-4.  This is the worst level spondylosis. 4.  Mild to moderate central and bilateral foraminal stenosis at C4- 5. 5.  Mild to  moderate central canal stenosis at C5-6. 6.  Mild foraminal narrowing bilaterally at C6-7.   Original Report Authenticated By: Jamesetta Orleans. MATTERN, M.D.    Dg Chest Port 1 View  01/07/2012  *RADIOLOGY REPORT*  Clinical Data: Right lower lobe crackles  PORTABLE CHEST - 1 VIEW   Comparison: 01/06/2012  Findings: Stable heart size and central vascularity.  No CHF, developing pneumonia, collapse, consolidation, effusion, pneumothorax.  Atherosclerosis of the aorta.  Degenerative changes of the spine.  Right axillary surgical clips noted.  IMPRESSION: Stable low volume exam with atelectasis   Original Report Authenticated By: Judie Petit. Ruel Favors, M.D.    Dg Chest Port 1 View  01/06/2012  *RADIOLOGY REPORT*  Clinical Data: Right lower lobe crackles concerning for pneumonia  PORTABLE CHEST - 1 VIEW  Comparison: 01/05/2012  Findings: Normal heart size and vascularity.  Negative for CHF, edema, significant effusion, or pneumothorax.  Lower lung volumes accentuating the basilar vascular markings and minimal atelectasis. Atherosclerosis of the aorta.  Degenerative changes of the spine. Left axillary clips noted.  Diffuse osteopenia evident.  IMPRESSION: Low volume exam with atelectasis.   Original Report Authenticated By: Judie Petit. Ruel Favors, M.D.     Medications:  Scheduled:   . amoxicillin  500 mg Oral Q8H  . antiseptic oral rinse  15 mL Mouth Rinse q12n4p  . aspirin  81 mg Oral Daily  . bacitracin   Topical BID  . feeding supplement  237 mL Oral BID BM  . labetalol  100 mg Oral TID  . pantoprazole (PROTONIX) IV  40 mg Intravenous QHS  . DISCONTD: aspirin  150 mg Rectal q morning - 10a  . DISCONTD: chlorhexidine  15 mL Mouth Rinse BID  . DISCONTD: insulin aspart  1-3 Units Subcutaneous Q4H   Continuous:   . sodium chloride 60 mL/hr (01/06/12 1134)    Assessment/Plan: 1) Anemia. 2) Oral lacerations. 3) Stoke.   HGB stable and she appears stable from the GI standpoint.  The edema from the lip laceration is improving.  Plan: 1) Continue supportive care. 2) No further GI intervention required at this time. 3) Signing off.  LOS: 5 days   Laia Wiley D 01/08/2012, 7:34 AM

## 2012-01-08 NOTE — Progress Notes (Signed)
Name: Amy Martin MRN: [id number] DOB: 13-Jul-1919    LOS: 5  [facility]  HPI:  Amy Martin is a 78 y/o woman with history of dementia, anemia 2/2 colonic polyp, and recent history of "mini strokes" who presented to the [facility] ED via EMS after a fall.  She was in the process of being admitted to the hospitalist for altered mental status when it was noted that she had a new dense left hemi-paresis.  Code stroke was called and after evaluation by the on call neurologist, it was felt that the benefits of tPA outweighed the risks of bleeding and tPA was administered.  CCM was called for admission to the NSICU for monitoring.  Prior to admission she developed bleeding from a laceration on her lip sustained during her fall, as well as bleeding into the soft tissue of her left eye and chest where she had sustained bruising from her fall.  Her family expressed that her wishes are that she would not want to be intubated or resuscitated in the event of pulmonary or cardiac arrest to both the neurologist and the ED staff. Now on the regular floor, no acute complaints.   A/P  1.  S/p acute stroke s/p tPA with trace hemorrhage in the ventricle and minimal SDH The patient presented to the ED after a fall and a new acute stroke was diagnosed. She received tPa.  MRI brain obtained 10/7 after tPa administration showed a moderate size acute right hemispheric infarct involving  the right opercular region, right sub insular region, right frontal lobe and junction with the right parietal lobe. She also had some intraventricular bleed and a small SDH which were minimal. Secondary stroke prevention will be done with aspirin only as per Neuro recs.  - clinically the patient does have a moderate left hemiparesis , predominantly facial and UE She has severe dysphagia with pocketing and drooling.  Plan for rehab at SNF   A: Hypertension P: On PO labetalol with fair control   A:  Anemia P:  - History of  bleeding polyp that has not required transfusion Current anemia is due to acute blood loss - most likely due to bleeding from oral laceration   A:  AKI and hypokalemia P:   Resolved after iv fluids.     L lip swelling and hematoma - required suture by ENT on empiric abx with amoxicillin.    RLL crackles  Due to atelectasis       Vital Signs: Temp:  [97.5 F (36.4 C)-98.5 F (36.9 C)] 98.3 F (36.8 C) (10/10 1400) Pulse Rate:  [69-89] 89  (10/10 1400) Resp:  [17-18] 18  (10/10 1400) BP: (131-175)/(60-81) 131/81 mmHg (10/10 1400) SpO2:  [98 %-100 %] 100 % (10/10 1400) Weight:  [53.7 kg (118 lb 6.2 oz)-54.9 kg (121 lb 0.5 oz)] 53.7 kg (118 lb 6.2 oz) (10/10 0600) Patient Vitals for the past 24 hrs:  BP Temp Temp src Pulse Resp SpO2 Weight  01/08/12 1400 131/81 mmHg 98.3 F (36.8 C) Oral 89  18  100 % -  01/08/12 1000 165/71 mmHg 98 F (36.7 C) Oral 69  18  99 % -  01/08/12 0600 157/73 mmHg 98.4 F (36.9 C) Oral 89  18  98 % 53.7 kg (118 lb 6.2 oz)  01/08/12 0200 156/68 mmHg 98.1 F (36.7 C) Oral 72  18  99 % -  01/08/12 0007 - - - - - - 54.9 kg (121 lb 0.5 oz)  01/07/12  2200 175/70 mmHg 97.5 F (36.4 C) Oral 82  18  100 % -  01/07/12 1800 153/60 mmHg 98.5 F (36.9 C) Oral 81  17  100 % -    Physical Examination: Axo to self only CVS: RRR Left lip with demea Facial droop Left hemiparesis RS: ctab    Active Problems:  CVA (cerebral infarction)  Anemia  Lip swelling  AKI (acute kidney injury)  HTN (hypertension)  PROCEDURES  Ct Head Wo Contrast  01/05/2012 1. No new discrete areas of intracranial or extra-axial hemorrhage. 2. Grossly unchanged trace amount of intraventricular hemorrhage. No hydrocephalus or midline shift. 3. Previously described small anterior falcine subdural hematoma is not well depicted on this motion degraded examination. 4. Interval decrease in now small subcutaneous hematoma about the left forehead. No displaced calvarial fracture. 5.  Extensive atrophy and microvascular ischemic disease.  Ct Head Wo Contrast  01/04/2012 1. Small amount of intraventricular hemorrhage in the occipital horn of the left lateral ventricle. No hydrocephalus. 2. Interval formation of a small anterior falcine subdural hematoma. 3. Left facial and forehead scalp hematoma. I discussed these findings by telephone with Dr. Onalee Hua at 7:14 a.m. on 01/04/2012.  Ct Head Wo Contrast  01/04/2012 * 1. Negative for bleed or other acute intracranial process. 2. Atrophy and nonspecific white matter changes.  Ct Head Wo Contrast  01/03/2012 1. Negative for bleed or other acute intracranial process. 2. Atrophy and nonspecific white matter changes. Right frontal scalp soft tissue swelling.  CT MAXILLOFACIAL  01/03/2012 1. Negative for fracture or other acute bony abnormality.  CT CERVICAL SPINE  01/03/2012 1. Negative for fracture or other acute bony abnormality. 2. Multilevel degenerative changes as enumerated above. 3. Bilateral carotid bifurcation plaque.  Dg Chest Port 1 View  01/05/2012 No acute cardiopulmonary disease Tortuous aorta with atherosclerotic vascular calcifications.  MRI of the brain  Moderate size acute right hemispheric infarct as detailed above.  Broad-based right parietal - occipital - posterior right temporal  subdural hematoma measuring up to 4.4 mm.  Sclerotic linear structure through the base of the dens. MR of  the cervical spine with STIR sequence can be obtained to see if  there is a CT occult injury at this level.  Multiple areas of blood breakdown products throughout the basal  ganglia and thalami and less so left frontal lobe may reflect  changes of the prior episode hemorrhagic ischemia although small  shear type injuries could contribute to this appearance.  Prominent small vessel disease type changes.  MRI Spine  1. No fracture at C2.  2. Moderate left and mild right foraminal stenosis at C2-3.  3. Moderate central  and bilateral foraminal stenosis at C3-4. This  is the worst level spondylosis.  4. Mild to moderate central and bilateral foraminal stenosis at C4-  5.  5. Mild to moderate central canal stenosis at C5-6.  6. Mild foraminal narrowing bilaterally at C6-7.  2D Echocardiogram Left ventricle: The cavity size was normal. Systolicfunction was normal. The estimated ejection fraction wasin the range of 60% to 65%. Wall motion was normal; therewere no regional wall motion abnormalities. Doppler parameters are consistent with abnormal left ventricularrelaxation (grade 1 diastolic dysfunction). - Aortic valve: Trivial regurgitation. - Mitral valve: Moderately calcified annulus. Mild regurgitation. - Left atrium: The atrium was moderately dilated. - Pulmonary arteries: Systolic pressure was mildly increased.Impressions:- No cardiac source of embolism was identified, but cannotbe ruled out on the basis of this examination.  EEG1) Generalized irregular slow activity.  Clinical Interpretation: This abnormal EEG is consistent with a mild non-specific cerebral dysfunction. There was no seizure or seizure predisposition recorded on this study.  Carotid Doppler No evidence of hemodynamically significant internal carotid artery stenosis. Vertebral artery flow is antegrade.  EKG normal sinus rhythm.     Lab 01/06/12 0618 01/05/12 0442 01/04/12 1607 01/04/12 0705 01/03/12 2023  NA 142 141 142 139 136  K 4.0 4.2 -- -- --  CL 112 111 111 105 103  CO2 21 21 23 25 22   BUN 13 18 23  25* 25*  CREATININE 0.98 1.06 1.07 1.14* 1.21*  CALCIUM 9.2 9.3 9.6 9.2 9.6  MG 1.7 -- -- -- --  PHOS 2.4 -- -- -- --    Lab 01/05/12 0442 01/03/12 2023  AST 14 12  ALT 7 7  ALKPHOS 82 83  BILITOT 0.4 0.2*  PROT 5.6* 6.0  ALBUMIN 2.6* 2.5*    Lab 01/07/12 1041 01/06/12 1321 01/06/12 0618 01/05/12 0442 01/04/12 0705 01/04/12 0033  HGB 7.7* 7.2* 7.5* 7.7* 8.6* --  HCT 23.3* 21.7* 22.8* 23.3* 26.7* --  PLT PLATELET CLUMPS  NOTED ON SMEAR, COUNT APPEARS ADEQUATE 182 205 220 236 --  INR -- -- -- -- -- 1.13  APTT -- -- -- -- -- --       . amoxicillin  500 mg Oral Q8H  . antiseptic oral rinse  15 mL Mouth Rinse q12n4p  . aspirin  81 mg Oral Daily  . bacitracin   Topical BID  . feeding supplement  237 mL Oral BID BM  . labetalol  100 mg Oral TID  . pantoprazole  40 mg Oral Q1200  . DISCONTD: pantoprazole (PROTONIX) IV  40 mg Intravenous QHS     Ivelise Castillo Lavera Guise, MD  [phone]

## 2012-01-08 NOTE — Clinical Social Work Psychosocial (Signed)
     Clinical Social Work Department BRIEF PSYCHOSOCIAL ASSESSMENT 01/08/2012  Patient:  Amy Martin, Amy Martin     Account Number:  [id number]     Admit date:  01/03/2012  Clinical Social Worker:  Peggyann Shoals  Date/Time:  01/07/2012 04:00 PM  Referred by:  Physician  Date Referred:  01/07/2012 Referred for  Other - See comment   Other Referral:   Pt is from admitted from a facility.   Interview type:  Family Other interview type:    PSYCHOSOCIAL DATA Living Status:  FACILITY Admitted from facility:   Level of care:  Assisted Living Primary support name:  [name] Cull/son Primary support relationship to patient:  CHILD, ADULT Degree of support available:   supportive    CURRENT CONCERNS Current Concerns  Post-Acute Placement   Other Concerns:    SOCIAL WORK ASSESSMENT / PLAN CSW met with pt, however she is aphasic and difficult to communicate. Pt's son mother in law was present and shared that her son would be the one to speak to.    CSW spoke with pt's daughter in law, who shared that pt's son and daughter in law hold pt's HCPOA. Pt's daughter shared that pt lives at [facility]. CSW shared that pt may need a higher level of care. Pt's stated that she would have to visit each facility prior to pt's discharge to ensure an adequate transition for pt. Pt's daughter in law was agreeable to sending a referral to [facility].    CSW will initiate a SNF search in [facility] and will follow up with bed offers. CSW will continue to follow.   Assessment/plan status:  Psychosocial Support/Ongoing Assessment of Needs Other assessment/ plan:   Information/referral to community resources:   SNF List    PATIENTS/FAMILYS RESPONSE TO PLAN OF CARE: Pt is unable to communicate as she is aphasic. Pt's daughter in law is agreeable to discharge plan.

## 2012-01-08 NOTE — Progress Notes (Signed)
Occupational Therapy Treatment Patient Details Name: Amy Martin MRN: [id number] DOB: Oct 04, 1919 Today's Date: 01/08/2012 Time: 4098-1191 OT Time Calculation (min): 20 min  OT Assessment / Plan / Recommendation Comments on Treatment Session This 78 yo female not really making progress today. Will continue to see while she is here to see if progress can be made    Follow Up Recommendations  Skilled nursing facility       Equipment Recommendations  None recommended by OT       Frequency Min 3X/week   Plan Discharge plan remains appropriate    Precautions / Restrictions Precautions Precautions: Fall Restrictions Weight Bearing Restrictions: No       ADL  Eating/Feeding: Performed;+1 Total assistance (would not eat, even when food placed in her mouth) Where Assessed - Eating/Feeding: Chair (supported) Grooming: Performed;+1 Total assistance;Wash/dry face Where Assessed - Grooming: Supine, head of bed up Toilet Transfer: Simulated Toilet Transfer Method: Stand pivot Acupuncturist:  (Bed to recliner to her left) Transfers/Ambulation Related to ADLs: Max A sit to stand and stand to sit      OT Goals ADL Goals ADL Goal: Grooming - Progress: Not progressing (total A today) ADL Goal: Toilet Transfer - Progress: Progressing toward goals Additional ADL Goal #1: Pt will attend to objects on her Rt side with min cues in a structured environment. ADL Goal: Additional Goal #1 - Progress: Not progressing  Visit Information  Last OT Received On: 01/08/12 Assistance Needed: +2 (for ambulation and safety with transfers)          Cognition  Overall Cognitive Status: History of cognitive impairments - further impaired Arousal/Alertness: Lethargic Behavior During Session: [facility] for tasks performed Following Commands: Follows one step commands inconsistently Safety/Judgement - Other Comments: No impulsiveness seen today    Mobility  Shoulder Instructions Bed  Mobility Bed Mobility: Supine to Sit;Sitting - Scoot to Edge of Bed Supine to Sit: 1: +1 Total assist (Pt=10%) Sitting - Scoot to Edge of Bed: 1: +1 Total assist (pt=0%, however able to maintain sitting balance) Transfers Transfers: Sit to Stand;Stand to Sit Sit to Stand: 2: Max assist;Without upper extremity assist;From bed Stand to Sit: 2: Max assist;Without upper extremity assist;To chair/3-in-1             End of Session OT - End of Session Equipment Utilized During Treatment: Gait belt Activity Tolerance: Patient limited by fatigue Patient left: in chair;with call bell/phone within reach Nurse Communication: Mobility status (+2 with recliner next to bed, staff already had her up to3n1)       Evette Georges [phone] 01/08/2012, 1:55 PM

## 2012-01-09 MED ORDER — LABETALOL HCL 200 MG PO TABS
200.0000 mg | ORAL_TABLET | Freq: Three times a day (TID) | ORAL | Status: DC
  Administered 2012-01-09 – 2012-01-12 (×9): 200 mg via ORAL
  Filled 2012-01-09 (×11): qty 1

## 2012-01-09 NOTE — Progress Notes (Signed)
Name: Amy Martin MRN: [id number] DOB: Aug 14, 1919    LOS: 6  [facility]  HPI:  Ms. Samarin is a 78 y/o woman with history of dementia, anemia 2/2 colonic polyp, and recent history of "mini strokes" who presented to the [facility] ED via EMS after a fall.  She was in the process of being admitted to the hospitalist for altered mental status when it was noted that she had a new dense left hemi-paresis.  Code stroke was called and after evaluation by the on call neurologist, it was felt that the benefits of tPA outweighed the risks of bleeding and tPA was administered.  CCM was called for admission to the NSICU for monitoring.  Prior to admission she developed bleeding from a laceration on her lip sustained during her fall, as well as bleeding into the soft tissue of her left eye and chest where she had sustained bruising from her fall.  Her family expressed that her wishes are that she would not want to be intubated or resuscitated in the event of pulmonary or cardiac arrest to both the neurologist and the ED staff. Now on the regular floor, no acute complaints.   A/P  1.  S/p acute stroke s/p tPA with trace hemorrhage in the ventricle and minimal SDH The patient presented to the ED after a fall and a new acute stroke was diagnosed. She received tPa.  MRI brain obtained 10/7 after tPa administration showed a moderate size acute right hemispheric infarct involving  the right opercular region, right sub insular region, right frontal lobe and junction with the right parietal lobe. She also had some intraventricular bleed and a small SDH which were minimal. Secondary stroke prevention will be done with aspirin only as per Neuro recs.  - clinically the patient does have a moderate left hemiparesis , predominantly facial and UE She has severe dysphagia with pocketing and drooling. She seems unable to eat and rink enough to sustain herself. We need a familly meeting Plan for rehab at SNF   A:  Hypertension P: On PO labetalol with fair control   A:  Anemia P:  - History of bleeding polyp that has not required transfusion Current anemia is due to acute blood loss - most likely due to bleeding from oral laceration   A:  AKI and hypokalemia P:   Resolved after iv fluids.     L lip swelling and hematoma - required suture by ENT on empiric abx with amoxicillin.        Vital Signs: Temp:  [97.6 F (36.4 C)-98.8 F (37.1 C)] 98.1 F (36.7 C) (10/11 1035) Pulse Rate:  [73-100] 100  (10/11 1035) Resp:  [18] 18  (10/11 1035) BP: (131-183)/(68-95) 169/95 mmHg (10/11 1035) SpO2:  [97 %-100 %] 100 % (10/11 1035) Weight:  [55.4 kg (122 lb 2.2 oz)] 55.4 kg (122 lb 2.2 oz) (10/11 4696) Patient Vitals for the past 24 hrs:  BP Temp Temp src Pulse Resp SpO2 Weight  01/09/12 1035 169/95 mmHg 98.1 F (36.7 C) Axillary 100  18  100 % -  01/09/12 0623 - - - - - - 55.4 kg (122 lb 2.2 oz)  01/09/12 0600 183/73 mmHg 98.8 F (37.1 C) Oral 82  18  100 % -  01/09/12 0200 170/68 mmHg 97.8 F (36.6 C) Oral 73  18  100 % -  01/08/12 2200 161/77 mmHg 97.6 F (36.4 C) Oral 83  18  97 % -  01/08/12 1800 143/73 mmHg  98.5 F (36.9 C) Oral 90  18  100 % -  01/08/12 1400 131/81 mmHg 98.3 F (36.8 C) Oral 89  18  100 % -    Physical Examination: Alert , oriented  to self only CVS: RRR Left lip with edema Facial droop Left hemiparesis RS: ctab    Principal Problem:  *CVA (cerebral infarction) Active Problems:  Anemia  Lip swelling  AKI (acute kidney injury)  HTN (hypertension)  PROCEDURES  Ct Head Wo Contrast  01/05/2012 1. No new discrete areas of intracranial or extra-axial hemorrhage. 2. Grossly unchanged trace amount of intraventricular hemorrhage. No hydrocephalus or midline shift. 3. Previously described small anterior falcine subdural hematoma is not well depicted on this motion degraded examination. 4. Interval decrease in now small subcutaneous hematoma about the left  forehead. No displaced calvarial fracture. 5. Extensive atrophy and microvascular ischemic disease.  Ct Head Wo Contrast  01/04/2012 1. Small amount of intraventricular hemorrhage in the occipital horn of the left lateral ventricle. No hydrocephalus. 2. Interval formation of a small anterior falcine subdural hematoma. 3. Left facial and forehead scalp hematoma. I discussed these findings by telephone with Dr. Onalee Hua at 7:14 a.m. on 01/04/2012.  Ct Head Wo Contrast  01/04/2012 * 1. Negative for bleed or other acute intracranial process. 2. Atrophy and nonspecific white matter changes.  Ct Head Wo Contrast  01/03/2012 1. Negative for bleed or other acute intracranial process. 2. Atrophy and nonspecific white matter changes. Right frontal scalp soft tissue swelling.  CT MAXILLOFACIAL  01/03/2012 1. Negative for fracture or other acute bony abnormality.  CT CERVICAL SPINE  01/03/2012 1. Negative for fracture or other acute bony abnormality. 2. Multilevel degenerative changes as enumerated above. 3. Bilateral carotid bifurcation plaque.  Dg Chest Port 1 View  01/05/2012 No acute cardiopulmonary disease Tortuous aorta with atherosclerotic vascular calcifications.  MRI of the brain  Moderate size acute right hemispheric infarct as detailed above.  Broad-based right parietal - occipital - posterior right temporal  subdural hematoma measuring up to 4.4 mm.  Sclerotic linear structure through the base of the dens. MR of  the cervical spine with STIR sequence can be obtained to see if  there is a CT occult injury at this level.  Multiple areas of blood breakdown products throughout the basal  ganglia and thalami and less so left frontal lobe may reflect  changes of the prior episode hemorrhagic ischemia although small  shear type injuries could contribute to this appearance.  Prominent small vessel disease type changes.  MRI Spine  1. No fracture at C2.  2. Moderate left and mild right  foraminal stenosis at C2-3.  3. Moderate central and bilateral foraminal stenosis at C3-4. This  is the worst level spondylosis.  4. Mild to moderate central and bilateral foraminal stenosis at C4-  5.  5. Mild to moderate central canal stenosis at C5-6.  6. Mild foraminal narrowing bilaterally at C6-7.  2D Echocardiogram Left ventricle: The cavity size was normal. Systolicfunction was normal. The estimated ejection fraction wasin the range of 60% to 65%. Wall motion was normal; therewere no regional wall motion abnormalities. Doppler parameters are consistent with abnormal left ventricularrelaxation (grade 1 diastolic dysfunction). - Aortic valve: Trivial regurgitation. - Mitral valve: Moderately calcified annulus. Mild regurgitation. - Left atrium: The atrium was moderately dilated. - Pulmonary arteries: Systolic pressure was mildly increased.Impressions:- No cardiac source of embolism was identified, but cannotbe ruled out on the basis of this examination.  EEG1) Generalized irregular  slow activity.  Clinical Interpretation: This abnormal EEG is consistent with a mild non-specific cerebral dysfunction. There was no seizure or seizure predisposition recorded on this study.  Carotid Doppler No evidence of hemodynamically significant internal carotid artery stenosis. Vertebral artery flow is antegrade.  EKG normal sinus rhythm.     Lab 01/06/12 0618 01/05/12 0442 01/04/12 1607 01/04/12 0705 01/03/12 2023  NA 142 141 142 139 136  K 4.0 4.2 -- -- --  CL 112 111 111 105 103  CO2 21 21 23 25 22   BUN 13 18 23  25* 25*  CREATININE 0.98 1.06 1.07 1.14* 1.21*  CALCIUM 9.2 9.3 9.6 9.2 9.6  MG 1.7 -- -- -- --  PHOS 2.4 -- -- -- --    Lab 01/05/12 0442 01/03/12 2023  AST 14 12  ALT 7 7  ALKPHOS 82 83  BILITOT 0.4 0.2*  PROT 5.6* 6.0  ALBUMIN 2.6* 2.5*    Lab 01/07/12 1041 01/06/12 1321 01/06/12 0618 01/05/12 0442 01/04/12 0705 01/04/12 0033  HGB 7.7* 7.2* 7.5* 7.7* 8.6* --  HCT 23.3*  21.7* 22.8* 23.3* 26.7* --  PLT PLATELET CLUMPS NOTED ON SMEAR, COUNT APPEARS ADEQUATE 182 205 220 236 --  INR -- -- -- -- -- 1.13  APTT -- -- -- -- -- --       . amoxicillin  500 mg Oral Q8H  . antiseptic oral rinse  15 mL Mouth Rinse q12n4p  . aspirin  81 mg Oral Daily  . bacitracin   Topical BID  . feeding supplement  237 mL Oral BID BM  . labetalol  200 mg Oral TID  . pantoprazole  40 mg Oral Q1200  . DISCONTD: labetalol  100 mg Oral TID   10/11 tried calling the son on house and cell phone to relate to him that the patient is unable to eat. I was unable to reach him   Lonia Blood, [place]  [phone]

## 2012-01-09 NOTE — Progress Notes (Signed)
Physical Therapy Treatment Patient Details Name: Amy Martin MRN: [id number] DOB: May 23, 1919 Today's Date: 01/09/2012 Time: 4098-1191 PT Time Calculation (min): 29 min  PT Assessment / Plan / Recommendation Comments on Treatment Session  Difficulty getting pt to focus on session, she was distracted with something on the left and I couldn't understand what she was asking for. Pt stood x3 but unable to ambulate today with poor static standing needing +2totalpt50% to stay tall and prevent posterior lean.     Follow Up Recommendations  Post acute inpatient     Does the patient have the potential to tolerate intense rehabilitation  No, Recommend SNF  Barriers to Discharge        Equipment Recommendations  None recommended by PT    Recommendations for Other Services    Frequency Min 3X/week   Plan Discharge plan remains appropriate;Frequency needs to be updated    Precautions / Restrictions Precautions Precautions: Fall Restrictions Weight Bearing Restrictions: No       Mobility  Bed Mobility Bed Mobility: Rolling Left;Left Sidelying to Sit Rolling Left: 1: +2 Total assist Rolling Left: Patient Percentage: 50% Left Sidelying to Sit: 1: +2 Total assist Left Sidelying to Sit: Patient Percentage: 50% Details for Bed Mobility Assistance: mod facilitation at hip with sequencing cues for rolling and to bring trunk upright Transfers Transfers: Sit to Stand;Stand to Sit;Stand Pivot Transfers (x3) Sit to Stand: 1: +2 Total assist;With upper extremity assist;From bed Sit to Stand: Patient Percentage: 50% Stand to Sit: To bed;To chair/3-in-1 Stand Pivot Transfers: 1: +2 Total assist Stand Pivot Transfers: Patient Percentage: 50% Details for Transfer Assistance: mod facilitation for forward lean in order to bring trunk over BOS, facilitation at hips/sacrum for power up for tall standing, bilateral foot block to prevent feet from sliding out and mod anterior facilitation at hips once  standing to bring weight anteriorly over feet; SPT pt with better standing but needing sequencing cues to step Ambulation/Gait Ambulation/Gait Assistance: Not tested (comment)    Exercises Other Exercises Other Exercises: seated hip/trunk flexion for improved weight shift in prep for stand x5 needing mod facilitation at upper trunk     PT Goals Acute Rehab PT Goals PT Goal: Supine/Side to Sit - Progress: Not progressing PT Goal: Sit to Stand - Progress: Progressing toward goal PT Goal: Stand to Sit - Progress: Progressing toward goal PT Transfer Goal: Bed to Chair/Chair to Bed - Progress: Progressing toward goal  Visit Information  Last PT Received On: 01/09/12 Assistance Needed: +2    Subjective Data  Subjective: pt agreeable to getting to chair   Cognition  Overall Cognitive Status: History of cognitive impairments - further impaired Area of Impairment: Following commands;Attention Arousal/Alertness: Awake/alert Behavior During Session: [facility] for tasks performed Current Attention Level: Sustained Following Commands: Follows one step commands inconsistently Cognition - Other Comments: appears to have some left neglect    Balance  Static Sitting Balance Static Sitting - Balance Support: Right upper extremity supported Static Sitting - Level of Assistance: 4: Min assist Static Standing Balance Static Standing - Balance Support: Bilateral upper extremity supported Static Standing - Level of Assistance: 1: +2 Total assist (50%)  End of Session PT - End of Session Equipment Utilized During Treatment: Gait belt Activity Tolerance: Patient limited by fatigue Patient left: in chair;with call bell/phone within reach Nurse Communication: Mobility status   GP     [facility] HELEN 01/09/2012, 10:57 AM

## 2012-01-10 ENCOUNTER — Inpatient Hospital Stay (HOSPITAL_COMMUNITY): Payer: Medicare Other

## 2012-01-10 LAB — BASIC METABOLIC PANEL
BUN: 9 mg/dL (ref 6–23)
CO2: 21 mEq/L (ref 19–32)
Chloride: 112 mEq/L (ref 96–112)
GFR calc non Af Amer: 44 mL/min — ABNORMAL LOW (ref >90)
Glucose, Bld: 79 mg/dL (ref 70–99)
Potassium: 3.5 mEq/L (ref 3.5–5.1)
Sodium: 142 mEq/L (ref 135–145)

## 2012-01-10 LAB — CBC
HCT: 22.2 % — ABNORMAL LOW (ref 36.0–46.0)
Hemoglobin: 7.6 g/dL — ABNORMAL LOW (ref 12.0–15.0)
MCH: 28.9 pg (ref 26.0–34.0)
MCHC: 34.2 g/dL (ref 30.0–36.0)
MCV: 84.4 fL (ref 78.0–100.0)
RBC: 2.63 MIL/uL — ABNORMAL LOW (ref 3.87–5.11)

## 2012-01-10 MED ORDER — SODIUM CHLORIDE 0.9 % IV SOLN
INTRAVENOUS | Status: DC
  Administered 2012-01-10: 14:00:00 via INTRAVENOUS

## 2012-01-10 NOTE — Progress Notes (Signed)
Name: Amy Martin MRN: [id number] DOB: May 25, 1919    LOS: 7  [facility]  HPI:  Amy Martin is a 78 y/o woman with history of dementia, anemia 2/2 colonic polyp, and recent history of "mini strokes" who presented to the [facility] ED via EMS after a fall.  She was in the process of being admitted to the hospitalist for altered mental status when it was noted that she had a new dense left hemi-paresis.  Code stroke was called and after evaluation by the on call neurologist, it was felt that the benefits of tPA outweighed the risks of bleeding and tPA was administered.  CCM was called for admission to the NSICU for monitoring.  Prior to admission she developed bleeding from a laceration on her lip sustained during her fall, as well as bleeding into the soft tissue of her left eye and chest where she had sustained bruising from her fall.  Her family expressed that her wishes are that she would not want to be intubated or resuscitated in the event of pulmonary or cardiac arrest to both the neurologist and the ED staff. Now on the regular floor, no acute complaints. Unable to eat   A/P More lethargic on 01/10/12 - ? Seizure vs extension of stroke -  Stat head Ct ordered.    1.  S/p acute stroke s/p tPA with trace hemorrhage in the ventricle and minimal SDH The patient presented to the ED after a fall and a new acute stroke was diagnosed. She received tPa.  MRI brain obtained 10/7 after tPa administration showed a moderate size acute right hemispheric infarct involving  the right opercular region, right sub insular region, right frontal lobe and junction with the right parietal lobe. She also had some intraventricular bleed and a small SDH which were minimal. Secondary stroke prevention will be done with aspirin only as per Neuro recs.  - clinically the patient does have a moderate left hemiparesis , predominantly facial and UE She has severe dysphagia with pocketing and drooling. She seems  unable to eat and drink enough to sustain herself. We need a familly meeting Plan for rehab at SNF   A: Hypertension P: On PO labetalol with fair control   A:  Anemia P:  - History of bleeding polyp that has not required transfusion Current anemia is due to acute blood loss - most likely due to bleeding from oral laceration   A:  AKI and hypokalemia P:   Resolved after iv fluids.     L lip swelling and hematoma - required suture by ENT on empiric abx with amoxicillin.        Vital Signs: Temp:  [97.6 F (36.4 C)-98.5 F (36.9 C)] 97.6 F (36.4 C) (10/12 1017) Pulse Rate:  [65-75] 66  (10/12 1017) Resp:  [16-20] 20  (10/12 1017) BP: (143-160)/(50-61) 160/51 mmHg (10/12 1017) SpO2:  [100 %] 100 % (10/12 1017) Weight:  [54.1 kg (119 lb 4.3 oz)] 54.1 kg (119 lb 4.3 oz) (10/12 0500) Patient Vitals for the past 24 hrs:  BP Temp Temp src Pulse Resp SpO2 Weight  01/10/12 1017 160/51 mmHg 97.6 F (36.4 C) Oral 66  20  100 % -  01/10/12 0645 149/54 mmHg - - 65  20  100 % -  01/10/12 0500 - - - - - - 54.1 kg (119 lb 4.3 oz)  01/10/12 0140 152/58 mmHg 98 F (36.7 C) Oral 66  18  100 % -  01/09/12 2113 157/61  mmHg 98.2 F (36.8 C) Oral 67  16  100 % -  01/09/12 1826 143/50 mmHg 97.8 F (36.6 C) Axillary 75  18  100 % -  01/09/12 1355 155/61 mmHg 98.5 F (36.9 C) Axillary 72  18  100 % -    Physical Examination: Asleep Eyes deviated to the right Pupils of 2 mm symmetric CVS: RRR Left lip with edema Facial droop Left hemiparesis RS: ctab    Principal Problem:  *CVA (cerebral infarction) Active Problems:  Anemia  Lip swelling  AKI (acute kidney injury)  HTN (hypertension)  PROCEDURES  Ct Head Wo Contrast  01/05/2012 1. No new discrete areas of intracranial or extra-axial hemorrhage. 2. Grossly unchanged trace amount of intraventricular hemorrhage. No hydrocephalus or midline shift. 3. Previously described small anterior falcine subdural hematoma is not well  depicted on this motion degraded examination. 4. Interval decrease in now small subcutaneous hematoma about the left forehead. No displaced calvarial fracture. 5. Extensive atrophy and microvascular ischemic disease.  Ct Head Wo Contrast  01/04/2012 1. Small amount of intraventricular hemorrhage in the occipital horn of the left lateral ventricle. No hydrocephalus. 2. Interval formation of a small anterior falcine subdural hematoma. 3. Left facial and forehead scalp hematoma. I discussed these findings by telephone with Dr. Onalee Hua at 7:14 a.m. on 01/04/2012.  Ct Head Wo Contrast  01/04/2012 * 1. Negative for bleed or other acute intracranial process. 2. Atrophy and nonspecific white matter changes.  Ct Head Wo Contrast  01/03/2012 1. Negative for bleed or other acute intracranial process. 2. Atrophy and nonspecific white matter changes. Right frontal scalp soft tissue swelling.  CT MAXILLOFACIAL  01/03/2012 1. Negative for fracture or other acute bony abnormality.  CT CERVICAL SPINE  01/03/2012 1. Negative for fracture or other acute bony abnormality. 2. Multilevel degenerative changes as enumerated above. 3. Bilateral carotid bifurcation plaque.  Dg Chest Port 1 View  01/05/2012 No acute cardiopulmonary disease Tortuous aorta with atherosclerotic vascular calcifications.  MRI of the brain  Moderate size acute right hemispheric infarct as detailed above.  Broad-based right parietal - occipital - posterior right temporal  subdural hematoma measuring up to 4.4 mm.  Sclerotic linear structure through the base of the dens. MR of  the cervical spine with STIR sequence can be obtained to see if  there is a CT occult injury at this level.  Multiple areas of blood breakdown products throughout the basal  ganglia and thalami and less so left frontal lobe may reflect  changes of the prior episode hemorrhagic ischemia although small  shear type injuries could contribute to this appearance.    Prominent small vessel disease type changes.  MRI Spine  1. No fracture at C2.  2. Moderate left and mild right foraminal stenosis at C2-3.  3. Moderate central and bilateral foraminal stenosis at C3-4. This  is the worst level spondylosis.  4. Mild to moderate central and bilateral foraminal stenosis at C4-  5.  5. Mild to moderate central canal stenosis at C5-6.  6. Mild foraminal narrowing bilaterally at C6-7.  2D Echocardiogram Left ventricle: The cavity size was normal. Systolicfunction was normal. The estimated ejection fraction wasin the range of 60% to 65%. Wall motion was normal; therewere no regional wall motion abnormalities. Doppler parameters are consistent with abnormal left ventricularrelaxation (grade 1 diastolic dysfunction). - Aortic valve: Trivial regurgitation. - Mitral valve: Moderately calcified annulus. Mild regurgitation. - Left atrium: The atrium was moderately dilated. - Pulmonary arteries: Systolic pressure was  mildly increased.Impressions:- No cardiac source of embolism was identified, but cannotbe ruled out on the basis of this examination.  EEG1) Generalized irregular slow activity.  Clinical Interpretation: This abnormal EEG is consistent with a mild non-specific cerebral dysfunction. There was no seizure or seizure predisposition recorded on this study.  Carotid Doppler No evidence of hemodynamically significant internal carotid artery stenosis. Vertebral artery flow is antegrade.  EKG normal sinus rhythm.     Lab 01/10/12 0630 01/06/12 0618 01/05/12 0442 01/04/12 1607 01/04/12 0705  NA 142 142 141 142 139  K 3.5 4.0 -- -- --  CL 112 112 111 111 105  CO2 21 21 21 23 25   BUN 9 13 18 23  25*  CREATININE 1.08 0.98 1.06 1.07 1.14*  CALCIUM 9.0 9.2 9.3 9.6 9.2  MG -- 1.7 -- -- --  PHOS -- 2.4 -- -- --    Lab 01/05/12 0442 01/03/12 2023  AST 14 12  ALT 7 7  ALKPHOS 82 83  BILITOT 0.4 0.2*  PROT 5.6* 6.0  ALBUMIN 2.6* 2.5*    Lab 01/10/12 0630  01/07/12 1041 01/06/12 1321 01/06/12 0618 01/05/12 0442 01/04/12 0033  HGB 7.6* 7.7* 7.2* 7.5* 7.7* --  HCT 22.2* 23.3* 21.7* 22.8* 23.3* --  PLT 223 PLATELET CLUMPS NOTED ON SMEAR, COUNT APPEARS ADEQUATE 182 205 220 --  INR -- -- -- -- -- 1.13  APTT -- -- -- -- -- --       . amoxicillin  500 mg Oral Q8H  . antiseptic oral rinse  15 mL Mouth Rinse q12n4p  . aspirin  81 mg Oral Daily  . bacitracin   Topical BID  . feeding supplement  237 mL Oral BID BM  . labetalol  200 mg Oral TID  . pantoprazole  40 mg Oral Q1200   10/11 and 10/12 tried calling the son on house and cell phone to relate to him that the patient is unable to eat. I was unable to reach him   Lonia Blood, [place]  [id number]

## 2012-01-11 NOTE — Discharge Summary (Addendum)
Physician Discharge Summary  Amy Martin AOZ:[id number] DOB: 1919-11-27 DOA: 01/03/2012  PCP: No primary provider on file.  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 Lazarus Salines ENT  AKI (acute kidney injury) - resolved   HTN (hypertension) Dementia - moderate  Severe protein caloric malnutrition   Discharge Condition: arousable, needs feeding all day   Diet recommendation: Dysphagia 1, honey thick diet   Filed Weights   01/09/12 0623 01/10/12 0500 01/11/12 0500  Weight: 55.4 kg (122 lb 2.2 oz) 54.1 kg (119 lb 4.3 oz) 56.2 kg (123 lb 14.4 oz)    History of present illness:  Amy Martin is a 78 y/o woman with history of dementia, anemia 2/2 colonic polyp, and recent history of "mini strokes" who presented to the [facility] ED via EMS after a fall. She was in the process of being admitted to the hospitalist for altered mental status when it was noted that she had a new dense left hemi-paresis. Code stroke was called and after evaluation by the on call neurologist, it was felt that the benefits of tPA outweighed the risks of bleeding and tPA was administered. CCM was called for admission to the NSICU for monitoring. Prior to admission she developed bleeding from a laceration on her lip sustained during her fall, as well as bleeding into the soft tissue of her left eye and chest where she had sustained bruising from her fall. Her family expressed that her wishes are that she would not want to be intubated or resuscitated in the event of pulmonary or cardiac arrest to both the neurologist and the ED  staff. Now on the regular floor, no acute complaints. Unable to eat    Hospital Course:  S/p acute stroke s/p tPA with trace hemorrhage in the ventricle and minimal SDH  The patient presented to the ED after a fall and a new acute stroke was diagnosed. She received tPa.  MRI brain obtained 10/7 after tPa administration showed a moderate size acute right hemispheric infarct involving  the right opercular region, right sub insular region, right frontal lobe and junction with the right parietal lobe. She also had some intraventricular bleed and a small SDH which were minimal. Secondary stroke prevention will be done with aspirin only as per Neuro recs. Clinically the patient does have a moderate left hemiparesis , predominantly facial and UE She has severe dysphagia with pocketing and drooling. She seems unable to eat and drink enough to sustain herself. I had a family meeting with son Amy Martin on 10/12 and he clearly states that no artificial means of nutrition are desired by patient or family.  A: Hypertension   On PO hydralazine and metoprolol with fair control   A: Anemia  P:  - History of bleeding polyp that has not required transfusion. Patient was evaluated by Dr. Elnoria Howard from [facility] a few times and no signs and symptoms of GI bleed were found.   Current anemia is due to acute blood loss - most likely due to bleeding from lip laceration . Hemoglobin has been stable for days.  A: AKI and hypokalemia  Present  on admission, Resolved after iv fluids.  L lip swelling and hematoma - required suture by ENT and empiric abx with amoxicillin during hospitalization.      Procedures:  MRI, CT  Consultations:  Critical Care  Neurology  ENT - [facility]   Discharge Exam: Filed Vitals:   01/10/12 2200 01/11/12 0200 01/11/12 0500 01/11/12 0600  BP: 178/68 126/52  170/65  Pulse: 68 66  68  Temp: 98 F (36.7 C) 97.8 F (36.6 C)  98 F (36.7 C)  TempSrc: Oral Oral  Oral  Resp: 20 20  20   Height:       Weight:   56.2 kg (123 lb 14.4 oz)   SpO2: 99% 98%  99%    General: arousable, knows her name Cardiovascular: rrr Respiratory: ctab   Discharge Instructions     Medication List     As of 01/11/2012  9:33 AM    STOP taking these medications         clopidogrel 75 MG tablet   Commonly known as: PLAVIX      polycarbophil 625 MG tablet   Commonly known as: FIBERCON      polyethylene glycol packet   Commonly known as: MIRALAX / GLYCOLAX      QUEtiapine 25 MG tablet   Commonly known as: SEROQUEL      rivastigmine 1.5 MG capsule   Commonly known as: EXELON      senna 8.6 MG Tabs   Commonly known as: SENOKOT      TAKE these medications         acetaminophen 325 MG tablet   Commonly known as: TYLENOL   Take 650 mg by mouth every 4 (four) hours as needed. For pain      aspirin EC 325 MG tablet   Take 325 mg by mouth daily.      atorvastatin 40 MG tablet   Commonly known as: LIPITOR   Take 40 mg by mouth daily.      citalopram 20 MG tablet   Commonly known as: CELEXA   Take 20 mg by mouth every morning.      ferrous sulfate 325 (65 FE) MG tablet   Take 325 mg by mouth 2 (two) times daily.      hydrALAZINE 25 MG tablet   Commonly known as: APRESOLINE   Take 25 mg by mouth 3 (three) times daily.      lactobacillus acidophilus Tabs   Take 2 tablets by mouth every 12 (twelve) hours.      metoprolol 100 MG tablet   Commonly known as: LOPRESSOR   Take 100 mg by mouth daily.      omeprazole 40 MG capsule   Commonly known as: PRILOSEC   Take 40 mg by mouth daily.           Follow-up Information    Please follow up. (snf md)           The results of significant diagnostics from this hospitalization (including imaging, microbiology, ancillary and laboratory) are listed below for reference.    Significant Diagnostic Studies: Ct Head Wo Contrast  01/10/2012  *RADIOLOGY REPORT*  Clinical Data: Altered mental status.  New left dense hemiparesis.  Status post t-PA.  CT HEAD WITHOUT CONTRAST  Technique:  Contiguous axial images were obtained from the base of the skull through the vertex without contrast.  Comparison: MRI brain 01/05/2012.  Findings: The right to the MCA territory infarct is not significantly changed.  There is  no evidence for hemorrhage. Atrophy and extensive white matter disease is again noted.  The ventricles are proportionate to the degree of atrophy.  No significant extra-axial fluid collection is present. The previously noted subdural collection is no longer evident.  The mild mucosal thickening is present in the anterior ethmoid air cells.  The paranasal sinuses are otherwise clear.  The osseous skull is intact.  Atherosclerotic calcifications are noted within the cavernous carotid arteries.  IMPRESSION:  1.  Stable appearance of right MCA territory infarct. 2.  No evidence for hemorrhagic transformation. 3.  Stable atrophy extensive white matter disease. 4.  The previously seen subdural collection is no longer evident.   Original Report Authenticated By: Jamesetta Orleans. MATTERN, M.D.    Ct Head Wo Contrast  01/05/2012  *RADIOLOGY REPORT*  Clinical Data: 24 hours post t-PA  CT HEAD WITHOUT CONTRAST  Technique:  Contiguous axial images were obtained from the base of the skull through the vertex without contrast.  Comparison: Head CT - 01/04/2012; 01/03/2012  Findings:  Examination degraded secondary to patient motion artifact.  Previously identified trace amount of hemorrhage lying dependently within the occipital horn of the left lateral ventricle is unchanged to minimally decreased.  There is likely a trace amount of intraventricular blood within the right lateral ventricle (image 20, series 2).  Previously described small anterior falcine subdural hematoma is not definitively seen on this motion degraded examination.  No new discrete areas of intraparenchymal or extra- axial hemorrhage.  Extensive atrophy and microvascular ischemic  disease. Calcifications within the right basal ganglia. Given extensive background parenchymal abnormalities there is no definitive evidence of acute large territory infarct.  Unchanged size and configuration of the ventricles and basilar cisterns.  No midline shift.  No definite intraparenchymal or extra-axial mass.  The paranasal sinuses and mastoid air cells are normal.  Soft tissue swelling about the left forehead (image 18 and 19) has decreased in the interval.  No associated displaced calvarial fracture. Post bilateral cataract surgery.  IMPRESSION:  1.   No new discrete areas of intracranial or extra-axial hemorrhage. 2.  Grossly unchanged trace amount of intraventricular hemorrhage. No hydrocephalus or midline shift. 3.  Previously described small anterior falcine subdural hematoma is not well depicted on this motion degraded examination.  4.  Interval decrease in now small subcutaneous hematoma about the left forehead.  No displaced calvarial fracture.  5.  Extensive atrophy and microvascular ischemic disease.   Original Report Authenticated By: Waynard Reeds, M.D.    Ct Head Wo Contrast  01/04/2012  *RADIOLOGY REPORT*  Clinical Data: New right-sided weakness.  Increased lethargy.  CT HEAD WITHOUT CONTRAST  Technique:  Contiguous axial images were obtained from the base of the skull through the vertex without contrast.  Comparison: 01/04/2012 at the 12:13 a.m.  Findings: There is a small amount of intraventricular hemorrhage layering in the occipital horn of the left lateral ventricle on images 13-16 of series 2.  Stable white matter hypodensities favoring chronic ischemic microvascular white matter disease noted. Faint calcifications suspected in the right globus pallidus nucleus.  New small left falcine subdural hematoma noted anteriorly on images 9-15 of series 2.  Left forehead scalp hematoma noted.  This extends into the left periorbital soft tissues and left facial tissues.  I do not observe an  acute CVA.  No mass lesion noted.  IMPRESSION:  1.  Small amount of intraventricular hemorrhage in the occipital horn of the left lateral ventricle.  No hydrocephalus. 2.  Interval formation of a small anterior falcine subdural hematoma. 3.  Left facial and forehead scalp hematoma.  I discussed these findings by telephone with Dr. Onalee Hua at 7:14 a.m. on 01/04/2012.   Original Report Authenticated By: Dellia Cloud, M.D.    Ct Head Wo Contrast  01/04/2012  *RADIOLOGY REPORT*  Clinical Data: Left facial droop  CT HEAD WITHOUT CONTRAST  Technique:  Contiguous axial images were obtained from the base of the skull through the vertex without contrast.  Comparison:   the previous day's study  Findings: Atherosclerotic and physiologic intracranial calcifications.  Left frontal scalp soft tissue swelling again noted.  Patient motion degrades many images requiring repeat portions of the scan. Diffuse parenchymal atrophy. Patchy areas of hypoattenuation in deep and periventricular white matter bilaterally. Negative for acute intracranial hemorrhage, mass lesion, acute infarction, midline shift, or mass-effect. Acute infarct may be inapparent on noncontrast CT. Ventricles and sulci symmetric. Bone windows demonstrate no focal lesion.  IMPRESSION:  1. Negative for bleed or other acute intracranial process.  2. Atrophy and nonspecific white matter changes.   Original Report Authenticated By: Osa Craver, M.D.    Ct Head Wo Contrast  01/03/2012  *RADIOLOGY REPORT*  Clinical Data:  Found down the, altered mental status, hypertension  CT HEAD WITHOUT CONTRAST CT MAXILLOFACIAL WITHOUT CONTRAST CT CERVICAL SPINE WITHOUT CONTRAST  Technique:  Multidetector CT imaging of the head, cervical spine, and maxillofacial structures were performed using the standard protocol without intravenous contrast. Multiplanar CT image reconstructions of the cervical spine and maxillofacial structures were also  generated.  Comparison:   None  CT HEAD  Findings: Right frontal scalp soft tissue swelling. Atherosclerotic and physiologic intracranial calcifications. Diffuse parenchymal atrophy. Patchy areas of hypoattenuation in deep and periventricular white matter bilaterally. Negative for acute intracranial hemorrhage, mass lesion, acute infarction, midline shift, or mass-effect. Acute infarct may be inapparent on noncontrast CT. Ventricles and sulci symmetric. Bone windows demonstrate no focal lesion.  IMPRESSION:  1. Negative for bleed or other acute intracranial process.  2. Atrophy and nonspecific white matter changes  CT MAXILLOFACIAL  Findings:  Layering debris in the right frontal sinus.  Remainder of paranasal sinuses are normally developed and well aerated. Nasal septum midline.  Temporomandibular joints seated bilaterally. Mandible intact.  Zygomatic arches intact.  Orbits and globes unremarkable.  Negative for fracture.  Left frontal scalp soft tissue swelling. The patient is edentulous.  IMPRESSION:  1.  Negative for fracture or other acute bony abnormality.  CT CERVICAL SPINE  Findings:   Normal alignment.  No prevertebral soft tissue swelling.  Facets seated.  There is mild narrowing of the C4-5, C5- 6, and C 7T1 interspaces.  Small end plate spurs are noted at all levels C3-T4.  Degenerative changes at the atlantoaxial articulation.  Negative for fracture.  Bilateral calcified carotid bifurcation plaque.  Remainder visualized paraspinal soft tissues unremarkable.  IMPRESSION: 1.  Negative for fracture or other acute bony abnormality. 2.  Multilevel degenerative changes as enumerated above. 3.  Bilateral carotid bifurcation plaque.   Original Report Authenticated By: Osa Craver, M.D.    Ct Cervical Spine Wo Contrast  01/03/2012  *RADIOLOGY REPORT*  Clinical Data:  Found down the, altered mental status, hypertension  CT HEAD WITHOUT CONTRAST CT MAXILLOFACIAL WITHOUT CONTRAST CT CERVICAL SPINE  WITHOUT CONTRAST  Technique:  Multidetector CT imaging of the head, cervical spine, and maxillofacial structures were performed using the standard protocol without intravenous contrast. Multiplanar CT image reconstructions  of the cervical spine and maxillofacial structures were also generated.  Comparison:   None  CT HEAD  Findings: Right frontal scalp soft tissue swelling. Atherosclerotic and physiologic intracranial calcifications. Diffuse parenchymal atrophy. Patchy areas of hypoattenuation in deep and periventricular white matter bilaterally. Negative for acute intracranial hemorrhage, mass lesion, acute infarction, midline shift, or mass-effect. Acute infarct may be inapparent on noncontrast CT. Ventricles and sulci symmetric. Bone windows demonstrate no focal lesion.  IMPRESSION:  1. Negative for bleed or other acute intracranial process.  2. Atrophy and nonspecific white matter changes  CT MAXILLOFACIAL  Findings:  Layering debris in the right frontal sinus.  Remainder of paranasal sinuses are normally developed and well aerated. Nasal septum midline.  Temporomandibular joints seated bilaterally. Mandible intact.  Zygomatic arches intact.  Orbits and globes unremarkable.  Negative for fracture.  Left frontal scalp soft tissue swelling. The patient is edentulous.  IMPRESSION:  1.  Negative for fracture or other acute bony abnormality.  CT CERVICAL SPINE  Findings:   Normal alignment.  No prevertebral soft tissue swelling.  Facets seated.  There is mild narrowing of the C4-5, C5- 6, and C 7T1 interspaces.  Small end plate spurs are noted at all levels C3-T4.  Degenerative changes at the atlantoaxial articulation.  Negative for fracture.  Bilateral calcified carotid bifurcation plaque.  Remainder visualized paraspinal soft tissues unremarkable.  IMPRESSION: 1.  Negative for fracture or other acute bony abnormality. 2.  Multilevel degenerative changes as enumerated above. 3.  Bilateral carotid bifurcation plaque.    Original Report Authenticated By: Osa Craver, M.D.    Mr Brain Wo Contrast  01/05/2012  **ADDENDUM** CREATED: 01/05/2012 20:05:01  Results discussed with Dr. Otelia Limes 10 /09/2011 8:05 p.m.  **END ADDENDUM** SIGNED BY: Almedia Balls. Constance Goltz, M.D.   01/05/2012  *RADIOLOGY REPORT*  Clinical Data: Post fall.  Left hemiparesis.  Post t-PA 2 days ago.  MRI HEAD WITHOUT CONTRAST  Technique:  Multiplanar, multiecho pulse sequences of the brain and surrounding structures were obtained according to standard protocol without intravenous contrast.  Comparison: 01/03/2012, 01/04/2012 and 01/05/2012 CT.  No comparison MR.  Findings: Moderate size acute right hemispheric infarct involving the right opercular region, right sub insular region, right frontal lobe and junction with the right parietal lobe.  Left supraorbital subcutaneous hematoma/soft tissue swelling.  Broad-based right parietal - occipital - posterior right temporal subdural hematoma measuring up to 4.4 mm.  Small amount of dependent interventricular blood.  Multiple areas of blood breakdown products throughout the basal ganglia and thalami and less so left frontal lobe may reflect changes of the prior episode hemorrhagic ischemia although small shear type injuries could contribute to this appearance.  Prominent small vessel disease type changes.  Global atrophy without hydrocephalus.  No intracranial mass lesion noted on this unenhanced examination as seen separate from the above described findings.  Major intracranial vascular structures are patent.  Mild mucosal thickening paranasal sinuses.  Sclerotic linear structure through the base of the dens.   MR of the cervical spine with STIR sequence can be obtained to see if there is a CT occult injury at this level.  IMPRESSION: Moderate size acute right hemispheric infarct as detailed above.  Broad-based right parietal - occipital - posterior right temporal subdural hematoma measuring up to 4.4 mm.  Sclerotic  linear structure through the base of the dens.   MR of the cervical spine with STIR sequence can be obtained to see if there is a CT occult injury at  this level.  Multiple areas of blood breakdown products throughout the basal ganglia and thalami and less so left frontal lobe may reflect changes of the prior episode hemorrhagic ischemia although small shear type injuries could contribute to this appearance.  Prominent small vessel disease type changes.  Critical Value/emergent results were called by telephone at the time of interpretation on 01/05/2012 at 7:50 p.m. to Margaretha Glassing the patient's nurse pain, who verbally acknowledged these results.  Call into Dr. Agnes Lawrence,   Original Report Authenticated By: Fuller Canada, M.D.    Mr Cervical Spine Wo Contrast  01/06/2012  **ADDENDUM** CREATED: 01/06/2012 09:46:42  These results were called by telephone on 01/06/2012 at 09:45 p.m. to Annie Main, NP, who verbally acknowledged these results.  **END ADDENDUM** SIGNED BY: Chauncey Fischer, M.D.   01/06/2012  *RADIOLOGY REPORT*  Clinical Data: The dens fracture  MRI CERVICAL SPINE WITHOUT CONTRAST  Technique:  Multiplanar and multiecho pulse sequences of the cervical spine, to include the craniocervical junction and cervicothoracic junction, were obtained according to standard protocol without intravenous contrast.  Comparison: MRI of the brain 01/05/2012.  CT of the cervical spine 01/03/2012.  Findings: MRI of the cervical spine demonstrates marrow acute or healing fracture within the C2 vertebral body.  Thicker slices were utilized on the sagittal brain sequence.  The area in question on the previous study actually represents the disc space.  Craniocervical junction is within normal limits.  Moderate atrophy is again noted.  Normal signal is present within the cervical upper thoracic spinal cord.  C2-3:  Moderate left mild right foraminal stenosis secondary to uncovertebral disease.  C3-4:  Moderate  central canal stenosis is present with some flattening of the cervical spine.  Moderate foraminal stenosis is present bilaterally.  C4-5:  Mild to moderate central canal stenosis is present with effacement of the ventral CSF.  Mild to moderate foraminal narrowing is present bilaterally.  C5-6:  Moderate uncovertebral spurring is present bilaterally, leading to foraminal stenosis.  Mild to moderate central canal stenosis is present with effacement of the ventral CSF.  C6-7:  A broad-based disc osteophyte complex effaces ventral CSF. Mild foraminal stenosis is present bilaterally.  C7-T1:  No significant disc herniation or stenosis is present.  IMPRESSION:  1. No fracture at C2. 2.  Moderate left and mild right foraminal stenosis at C2-3. 3. Moderate central and bilateral foraminal stenosis at C3-4.  This is the worst level spondylosis. 4.  Mild to moderate central and bilateral foraminal stenosis at C4- 5. 5.  Mild to moderate central canal stenosis at C5-6. 6.  Mild foraminal narrowing bilaterally at C6-7.   Original Report Authenticated By: Jamesetta Orleans. MATTERN, M.D.    Dg Chest Port 1 View  01/07/2012  *RADIOLOGY REPORT*  Clinical Data: Right lower lobe crackles  PORTABLE CHEST - 1 VIEW  Comparison: 01/06/2012  Findings: Stable heart size and central vascularity.  No CHF, developing pneumonia, collapse, consolidation, effusion, pneumothorax.  Atherosclerosis of the aorta.  Degenerative changes of the spine.  Right axillary surgical clips noted.  IMPRESSION: Stable low volume exam with atelectasis   Original Report Authenticated By: Judie Petit. Ruel Favors, M.D.    Dg Chest Port 1 View  01/06/2012  *RADIOLOGY REPORT*  Clinical Data: Right lower lobe crackles concerning for pneumonia  PORTABLE CHEST - 1 VIEW  Comparison: 01/05/2012  Findings: Normal heart size and vascularity.  Negative for CHF, edema, significant effusion, or pneumothorax.  Lower lung volumes accentuating the basilar vascular markings and  minimal  atelectasis. Atherosclerosis of the aorta.  Degenerative changes of the spine. Left axillary clips noted.  Diffuse osteopenia evident.  IMPRESSION: Low volume exam with atelectasis.   Original Report Authenticated By: Judie Petit. Ruel Favors, M.D.    Dg Chest Port 1 View  01/05/2012  *RADIOLOGY REPORT*  Clinical Data: Dyspnea  PORTABLE CHEST - 1 VIEW  Comparison: None.  Findings: The lungs are well-aerated.  Negative for pulmonary edema, or focal airspace consolidation.  No pneumothorax, or pleural effusion.  Cardiac and mediastinal contours within normal limits.  The aorta is tortuous and there is atherosclerotic vascular calcifications involving the transverse and descending segments.  No acute osseous abnormality.  Degenerative changes of the bilateral acromioclavicular joints.  IMPRESSION:  No acute cardiopulmonary disease  Tortuous aorta with atherosclerotic vascular calcifications.   Original Report Authenticated By: Sterling Big, M.D.    Ct Maxillofacial Wo Cm  01/03/2012  *RADIOLOGY REPORT*  Clinical Data:  Found down the, altered mental status, hypertension  CT HEAD WITHOUT CONTRAST CT MAXILLOFACIAL WITHOUT CONTRAST CT CERVICAL SPINE WITHOUT CONTRAST  Technique:  Multidetector CT imaging of the head, cervical spine, and maxillofacial structures were performed using the standard protocol without intravenous contrast. Multiplanar CT image reconstructions of the cervical spine and maxillofacial structures were also generated.  Comparison:   None  CT HEAD  Findings: Right frontal scalp soft tissue swelling. Atherosclerotic and physiologic intracranial calcifications. Diffuse parenchymal atrophy. Patchy areas of hypoattenuation in deep and periventricular white matter bilaterally. Negative for acute intracranial hemorrhage, mass lesion, acute infarction, midline shift, or mass-effect. Acute infarct may be inapparent on noncontrast CT. Ventricles and sulci symmetric. Bone windows demonstrate no focal lesion.   IMPRESSION:  1. Negative for bleed or other acute intracranial process.  2. Atrophy and nonspecific white matter changes  CT MAXILLOFACIAL  Findings:  Layering debris in the right frontal sinus.  Remainder of paranasal sinuses are normally developed and well aerated. Nasal septum midline.  Temporomandibular joints seated bilaterally. Mandible intact.  Zygomatic arches intact.  Orbits and globes unremarkable.  Negative for fracture.  Left frontal scalp soft tissue swelling. The patient is edentulous.  IMPRESSION:  1.  Negative for fracture or other acute bony abnormality.  CT CERVICAL SPINE  Findings:   Normal alignment.  No prevertebral soft tissue swelling.  Facets seated.  There is mild narrowing of the C4-5, C5- 6, and C 7T1 interspaces.  Small end plate spurs are noted at all levels C3-T4.  Degenerative changes at the atlantoaxial articulation.  Negative for fracture.  Bilateral calcified carotid bifurcation plaque.  Remainder visualized paraspinal soft tissues unremarkable.  IMPRESSION: 1.  Negative for fracture or other acute bony abnormality. 2.  Multilevel degenerative changes as enumerated above. 3.  Bilateral carotid bifurcation plaque.   Original Report Authenticated By: Osa Craver, M.D.     Microbiology: Recent Results (from the past 240 hour(s))  MRSA PCR SCREENING     Status: Normal   Collection Time   01/04/12  3:44 AM      Component Value Range Status Comment   MRSA by PCR NEGATIVE  NEGATIVE Final      Labs: Basic Metabolic Panel:  Lab 01/10/12 9604 01/06/12 0618 01/05/12 0442 01/04/12 1607  NA 142 142 141 142  K 3.5 4.0 4.2 4.4  CL 112 112 111 111  CO2 21 21 21 23   GLUCOSE 79 84 115* 117*  BUN 9 13 18 23   CREATININE 1.08 0.98 1.06 1.07  CALCIUM 9.0 9.2  9.3 9.6  MG -- 1.7 -- --  PHOS -- 2.4 -- --   Liver Function Tests:  Lab 01/05/12 0442  AST 14  ALT 7  ALKPHOS 82  BILITOT 0.4  PROT 5.6*  ALBUMIN 2.6*   No results found for this basename:  LIPASE:5,AMYLASE:5 in the last 168 hours No results found for this basename: AMMONIA:5 in the last 168 hours CBC:  Lab 01/10/12 0630 01/07/12 1041 01/06/12 1321 01/06/12 0618 01/05/12 0442  WBC 5.4 6.7 8.3 9.0 13.3*  NEUTROABS -- -- -- -- --  HGB 7.6* 7.7* 7.2* 7.5* 7.7*  HCT 22.2* 23.3* 21.7* 22.8* 23.3*  MCV 84.4 84.4 84.1 85.4 84.4  PLT 223 PLATELET CLUMPS NOTED ON SMEAR, COUNT APPEARS ADEQUATE 182 205 220   Cardiac Enzymes: No results found for this basename: CKTOTAL:5,CKMB:5,CKMBINDEX:5,TROPONINI:5 in the last 168 hours BNP: BNP (last 3 results) No results found for this basename: PROBNP:3 in the last 8760 hours CBG:  Lab 01/07/12 1622 01/07/12 1211 01/07/12 0811 01/07/12 0400 01/07/12 0039  GLUCAP 137* 136* 131* 110* 118*    Time coordinating discharge: 35 minutes  Signed:  Davis Vannatter  [facility] 01/11/2012, 9:33 AM

## 2012-01-11 NOTE — Progress Notes (Signed)
Name: Autumm Hattery MRN: [id number] DOB: [birth year]    LOS: 8  [facility]  HPI:  Ms. Sottile is a 78 y/o woman with history of dementia, anemia 2/2 colonic polyp, and recent history of "mini strokes" who presented to the [facility] ED via EMS after a fall.  She was in the process of being admitted to the hospitalist for altered mental status when it was noted that she had a new dense left hemi-paresis.  Code stroke was called and after evaluation by the on call neurologist, it was felt that the benefits of tPA outweighed the risks of bleeding and tPA was administered.  CCM was called for admission to the NSICU for monitoring.  Prior to admission she developed bleeding from a laceration on her lip sustained during her fall, as well as bleeding into the soft tissue of her left eye and chest where she had sustained bruising from her fall.  Her family expressed that her wishes are that she would not want to be intubated or resuscitated in the event of pulmonary or cardiac arrest to both the neurologist and the ED staff. Now on the regular floor, no acute complaints. Unable to eat   A/P More lethargic on 01/10/12 - ? Seizure vs extension of stroke -  Stat head Ct ordered. - showed stable appearance . Patient returned to baseline. Probable inverted sleep wake cycle effect      S/p acute stroke s/p tPA with trace hemorrhage in the ventricle and minimal SDH The patient presented to the ED after a fall and a new acute stroke was diagnosed. She received tPa.  MRI brain obtained 10/7 after tPa administration showed a moderate size acute right hemispheric infarct involving  the right opercular region, right sub insular region, right frontal lobe and junction with the right parietal lobe. She also had some intraventricular bleed and a small SDH which were minimal. Secondary stroke prevention will be done with aspirin only as per Neuro recs.  - clinically the patient does have a moderate left hemiparesis  , predominantly facial and UE She has severe dysphagia with pocketing and drooling. She seems unable to eat and drink enough to sustain herself. Had a family meeting with son Laban Emperor on 10/12 and he clearly states that no artificial means of nutrition are desired by patient or family.   A: Hypertension P: On PO labetalol with fair control   A:  Anemia P:  - History of bleeding polyp that has not required transfusion Current anemia is due to acute blood loss - most likely due to bleeding from oral laceration   A:  AKI and hypokalemia P:   Resolved after iv fluids.     L lip swelling and hematoma - required suture by ENT on empiric abx with amoxicillin.     SUBJECTIVE C/o sore joints   Vital Signs: Temp:  [97.6 F (36.4 C)-98 F (36.7 C)] 98 F (36.7 C) (10/13 0600) Pulse Rate:  [61-70] 68  (10/13 0600) Resp:  [20] 20  (10/13 0600) BP: (126-178)/(51-68) 170/65 mmHg (10/13 0600) SpO2:  [98 %-100 %] 99 % (10/13 0600) Weight:  [56.2 kg (123 lb 14.4 oz)] 56.2 kg (123 lb 14.4 oz) (10/13 0500) Patient Vitals for the past 24 hrs:  BP Temp Temp src Pulse Resp SpO2 Weight  01/11/12 0600 170/65 mmHg 98 F (36.7 C) Oral 68  20  99 % -  01/11/12 0500 - - - - - - 56.2 kg (123 lb 14.4 oz)  01/11/12 0200 126/52 mmHg 97.8 F (36.6 C) Oral 66  20  98 % -  01/10/12 2200 178/68 mmHg 98 F (36.7 C) Oral 68  20  99 % -  01/10/12 1833 142/59 mmHg 98 F (36.7 C) Oral 70  20  100 % -  01/10/12 1347 144/55 mmHg 97.7 F (36.5 C) Oral 61  20  100 % -  01/10/12 1017 160/51 mmHg 97.6 F (36.4 C) Oral 66  20  100 % -    Physical Examination: Awake, knows name  CVS: RRR Left upper lip with edema and sutures  Facial droop Left hemiparesis RS: ctab    Principal Problem:  *CVA (cerebral infarction) Active Problems:  Anemia  Lip swelling  AKI (acute kidney injury)  HTN (hypertension)  PROCEDURES  Ct Head Wo Contrast  01/05/2012 1. No new discrete areas of intracranial or  extra-axial hemorrhage. 2. Grossly unchanged trace amount of intraventricular hemorrhage. No hydrocephalus or midline shift. 3. Previously described small anterior falcine subdural hematoma is not well depicted on this motion degraded examination. 4. Interval decrease in now small subcutaneous hematoma about the left forehead. No displaced calvarial fracture. 5. Extensive atrophy and microvascular ischemic disease.  Ct Head Wo Contrast  01/04/2012 1. Small amount of intraventricular hemorrhage in the occipital horn of the left lateral ventricle. No hydrocephalus. 2. Interval formation of a small anterior falcine subdural hematoma. 3. Left facial and forehead scalp hematoma. I discussed these findings by telephone with Dr. Onalee Hua at 7:14 a.m. on 01/04/2012.  Ct Head Wo Contrast  01/04/2012 * 1. Negative for bleed or other acute intracranial process. 2. Atrophy and nonspecific white matter changes.  Ct Head Wo Contrast  01/03/2012 1. Negative for bleed or other acute intracranial process. 2. Atrophy and nonspecific white matter changes. Right frontal scalp soft tissue swelling.  CT MAXILLOFACIAL  01/03/2012 1. Negative for fracture or other acute bony abnormality.  CT CERVICAL SPINE  01/03/2012 1. Negative for fracture or other acute bony abnormality. 2. Multilevel degenerative changes as enumerated above. 3. Bilateral carotid bifurcation plaque.  Dg Chest Port 1 View  01/05/2012 No acute cardiopulmonary disease Tortuous aorta with atherosclerotic vascular calcifications.  MRI of the brain  Moderate size acute right hemispheric infarct as detailed above.  Broad-based right parietal - occipital - posterior right temporal  subdural hematoma measuring up to 4.4 mm.  Sclerotic linear structure through the base of the dens. MR of  the cervical spine with STIR sequence can be obtained to see if  there is a CT occult injury at this level.  Multiple areas of blood breakdown products throughout the  basal  ganglia and thalami and less so left frontal lobe may reflect  changes of the prior episode hemorrhagic ischemia although small  shear type injuries could contribute to this appearance.  Prominent small vessel disease type changes.  MRI Spine  1. No fracture at C2.  2. Moderate left and mild right foraminal stenosis at C2-3.  3. Moderate central and bilateral foraminal stenosis at C3-4. This  is the worst level spondylosis.  4. Mild to moderate central and bilateral foraminal stenosis at C4-  5.  5. Mild to moderate central canal stenosis at C5-6.  6. Mild foraminal narrowing bilaterally at C6-7.  2D Echocardiogram Left ventricle: The cavity size was normal. Systolicfunction was normal. The estimated ejection fraction wasin the range of 60% to 65%. Wall motion was normal; therewere no regional wall motion abnormalities. Doppler parameters are consistent with abnormal  left ventricularrelaxation (grade 1 diastolic dysfunction). - Aortic valve: Trivial regurgitation. - Mitral valve: Moderately calcified annulus. Mild regurgitation. - Left atrium: The atrium was moderately dilated. - Pulmonary arteries: Systolic pressure was mildly increased.Impressions:- No cardiac source of embolism was identified, but cannotbe ruled out on the basis of this examination.  EEG1) Generalized irregular slow activity.  Clinical Interpretation: This abnormal EEG is consistent with a mild non-specific cerebral dysfunction. There was no seizure or seizure predisposition recorded on this study.  Carotid Doppler No evidence of hemodynamically significant internal carotid artery stenosis. Vertebral artery flow is antegrade.  EKG normal sinus rhythm.     Lab 01/10/12 0630 01/06/12 0618 01/05/12 0442 01/04/12 1607  NA 142 142 141 142  K 3.5 4.0 -- --  CL 112 112 111 111  CO2 21 21 21 23   BUN 9 13 18 23   CREATININE 1.08 0.98 1.06 1.07  CALCIUM 9.0 9.2 9.3 9.6  MG -- 1.7 -- --  PHOS -- 2.4 -- --    Lab  01/05/12 0442  AST 14  ALT 7  ALKPHOS 82  BILITOT 0.4  PROT 5.6*  ALBUMIN 2.6*    Lab 01/10/12 0630 01/07/12 1041 01/06/12 1321 01/06/12 0618 01/05/12 0442  HGB 7.6* 7.7* 7.2* 7.5* 7.7*  HCT 22.2* 23.3* 21.7* 22.8* 23.3*  PLT 223 PLATELET CLUMPS NOTED ON SMEAR, COUNT APPEARS ADEQUATE 182 205 220  INR -- -- -- -- --  APTT -- -- -- -- --       . amoxicillin  500 mg Oral Q8H  . antiseptic oral rinse  15 mL Mouth Rinse q12n4p  . aspirin  81 mg Oral Daily  . bacitracin   Topical BID  . feeding supplement  237 mL Oral BID BM  . labetalol  200 mg Oral TID  . pantoprazole  40 mg Oral Q1200     Lonia Blood, MD  [phone]

## 2012-01-12 ENCOUNTER — Ambulatory Visit (HOSPITAL_COMMUNITY): Payer: Self-pay

## 2012-01-12 NOTE — Clinical Social Work Placement (Signed)
     Clinical Social Work Department CLINICAL SOCIAL WORK PLACEMENT NOTE 01/12/2012  Patient:  TYSON, MASIN  Account Number:  [id number] Admit date:  01/03/2012  Clinical Social Worker:  Peggyann Shoals  Date/time:  01/12/2012 11:54 AM  Clinical Social Work is seeking post-discharge placement for this patient at the following level of care:   SKILLED NURSING   (*CSW will update this form in Epic as items are completed)   01/07/2012  Patient/family provided with [facility] Department of Clinical Social Works list of facilities offering this level of care within the geographic area requested by the patient (or if unable, by the patients family).  01/07/2012  Patient/family informed of their freedom to choose among providers that offer the needed level of care, that participate in Medicare, Medicaid or managed care program needed by the patient, have an available bed and are willing to accept the patient.  01/07/2012  Patient/family informed of [facility] ownership interest in [facility], as well as of the fact that they are under no obligation to receive care at this facility.  PASARR submitted to EDS on 01/12/2012 PASARR number received from EDS on 01/12/2012  FL2 transmitted to all facilities in geographic area requested by pt/family on  01/10/2012 FL2 transmitted to all facilities within larger geographic area on   Patient informed that his/her managed care company has contracts with or will negotiate with  certain facilities, including the following:     Patient/family informed of bed offers received:  01/12/2012 Patient chooses bed at [facility] Physician recommends and patient chooses bed at  [facility]  Patient to be transferred to [facility] on  01/12/2012 Patient to be transferred to facility by [facility]  The following physician request were entered in Epic:   Additional  Comments:

## 2012-01-12 NOTE — Clinical Social Work Note (Signed)
Clinical Social Work  Pt is ready for discharge to [facility]. Facility has received discharge summary and is ready to admit pt. Pt's family is agreeable to discharge plan. Pt will be transported by [facility]. CSW is signing off as no further needs identified.   Dede Query, MSW, Theresia Majors [phone]

## 2012-01-12 NOTE — Progress Notes (Signed)
Per physician order, pt discharged to SNF. Report given over the phone to charge RN at [facility]. EMS transport to room to transfer patient to stretcher. VSS. Pt off unit by stretcher accompanied by transport team.

## 2012-01-12 NOTE — Progress Notes (Signed)
Physical Therapy Treatment Patient Details Name: Amy Martin MRN: [id number] DOB: 1919/11/07 Today's Date: 01/12/2012 Time: 4098-1191 PT Time Calculation (min): 12 min  PT Assessment / Plan / Recommendation Comments on Treatment Session       Follow Up Recommendations  Post acute inpatient     Does the patient have the potential to tolerate intense rehabilitation  No, Recommend SNF  Barriers to Discharge        Equipment Recommendations  None recommended by PT    Recommendations for Other Services    Frequency Min 3X/week   Plan Discharge plan remains appropriate;Frequency remains appropriate    Precautions / Restrictions Precautions Precautions: Fall   Pertinent Vitals/Pain     Mobility  Bed Mobility Supine to Sit: 1: +1 Total assist Details for Bed Mobility Assistance: facilitation for all aspects of transfer. Patient able to move LEs towards EOB with some assistance Transfers Transfers: Lobbyist Pivot Transfers: 1: +2 Total assist;Without upper extremity assistance Squat Pivot Transfers: Patient Percentage: 10% Details for Transfer Assistance: Patient unable to stand fully with transfer today. Patient able to assist by leaning anteriorly with initiating transfer but unable to position or shift weight of hips towards recliner. Patient also unable to position feet with transfer Ambulation/Gait Ambulation/Gait Assistance: Not tested (comment) Modified Rankin (Stroke Patients Only) Pre-Morbid Rankin Score: Moderately severe disability Modified Rankin: Severe disability    Exercises     PT Diagnosis:    PT Problem List:   PT Treatment Interventions:     PT Goals Acute Rehab PT Goals PT Goal: Supine/Side to Sit - Progress: Not progressing PT Goal: Sit to Stand - Progress: Not progressing PT Goal: Stand to Sit - Progress: Not progressing PT Transfer Goal: Bed to Chair/Chair to Bed - Progress: Not progressing  Visit Information  Last PT  Received On: 01/12/12 Assistance Needed: +2    Subjective Data      Cognition  Overall Cognitive Status: History of cognitive impairments - further impaired Area of Impairment: Attention;Following commands Arousal/Alertness: Lethargic Behavior During Session: Lethargic Current Attention Level: Focused Cognition - Other Comments: Appears to have left neglect    Balance  Static Sitting Balance Static Sitting - Balance Support: No upper extremity supported;Feet supported Static Sitting - Level of Assistance: 2: Max assist  End of Session PT - End of Session Activity Tolerance: Patient limited by fatigue Patient left: in chair;with call bell/phone within reach Nurse Communication: Mobility status   GP     Fredrich Birks 01/12/2012, 11:39 AM  01/12/2012 Fredrich Birks PTA [phone] pager [phone] office

## 2012-03-11 ENCOUNTER — Ambulatory Visit (HOSPITAL_COMMUNITY)
Admission: RE | Admit: 2012-03-11 | Discharge: 2012-03-11 | Disposition: A | Discharge status: Home / Self Care | Payer: Medicare Other | Source: Ambulatory Visit | Attending: Internal Medicine | Admitting: Internal Medicine

## 2012-03-11 DIAGNOSIS — Z859 Personal history of malignant neoplasm, unspecified: Secondary | ICD-10-CM | POA: Insufficient documentation

## 2012-03-11 DIAGNOSIS — R1310 Dysphagia, unspecified: Secondary | ICD-10-CM

## 2012-03-11 DIAGNOSIS — R1312 Dysphagia, oropharyngeal phase: Secondary | ICD-10-CM | POA: Insufficient documentation

## 2012-03-11 DIAGNOSIS — F0390 Unspecified dementia without behavioral disturbance: Secondary | ICD-10-CM | POA: Insufficient documentation

## 2012-03-11 DIAGNOSIS — I69959 Hemiplegia and hemiparesis following unspecified cerebrovascular disease affecting unspecified side: Secondary | ICD-10-CM | POA: Insufficient documentation

## 2012-03-11 DIAGNOSIS — E119 Type 2 diabetes mellitus without complications: Secondary | ICD-10-CM | POA: Insufficient documentation

## 2012-03-11 DIAGNOSIS — I69991 Dysphagia following unspecified cerebrovascular disease: Secondary | ICD-10-CM | POA: Insufficient documentation

## 2012-03-11 DIAGNOSIS — R1311 Dysphagia, oral phase: Secondary | ICD-10-CM | POA: Insufficient documentation

## 2012-03-11 NOTE — Procedures (Signed)
Objective Swallowing Evaluation: Modified Barium Swallowing Study  Patient Details  Name: Amy Martin MRN: [id number] Date of Birth: 14-May-1919  Today's Date: 03/11/2012 Time: 4098-1191 SLP Time Calculation (min): 30 min  Past Medical History:  Past Medical History  Diagnosis Date  . Cancer   . Stroke   . Diabetes mellitus   . Dementia    Past Surgical History: No past surgical history on file. HPI:  Amy Martin is a 78 y/o woman arriving for an outpatient MBS for a diet upgrade from nectar thick liquids and dys 1 (puree) texture. Pt has a history of dementia, anemia 2/2 colonic polyp, TIA and CVA. She was admitted to [facility] in 10/13 for a fall and a new dense left hemi-paresis. MRI showed moderate size acute right hemispheric infarct involving the right opercular region, right sub insular region, right frontal lobe and junction with the right parietal lobe. Left supraorbital subcutaneous hematoma/soft tissue swelling. Broad-based right parietal - occipital - posterior right temporal subdural hematoma measuring up to 4.4 mm. Prior to admission she developed bleeding from a laceration on her lip sustained during her fall, as well as bleeding into the soft tissue of her left eye and chest where she had sustained bruising from her fall. SLP initiated a Dys 1 nectar thick liquid diet prior to d/c to SNF.      Assessment / Plan / Recommendation Clinical Impression  Clinical impression: Pt presents with a moderate oral dysphagia and a mild to moderate sensory based oropharyngeal dysphagia. Pt demonstrates left labial weakness, but compensates by taking sips on the right side of her mouth with minimal anterior spillage. Posterior proplusion of bolus is slow but functional. With soft fruit bar pt required min verbal cues to form bolus and transit posteriorally over 3 attempts prior to fully clearing oral cavity. Sensory deficits lead to a delay in swallow initiation to the pyriform sinuses with trace  intermittent aspiration of thin liquids prior to the swallow (increased with larger/straw sip boluses). If cues to clear her throat the pt easily expels trace aspirate. SLP may consisder initiating a Dys 2 fine chop diet with thin liquids and cues for a throat clear, follow solids with liquids.     Treatment Recommendation  Defer treatment plan to SLP at (Comment)    Diet Recommendation Dysphagia 2 (Fine chop);Thin liquid   Liquid Administration via: Cup;No straw Medication Administration: Whole meds with puree Supervision: Patient able to self feed;Full supervision/cueing for compensatory strategies Compensations: Slow rate;Small sips/bites;Follow solids with liquid;Clear throat intermittently Postural Changes and/or Swallow Maneuvers: Out of bed for meals    Other  Recommendations     Follow Up Recommendations  Skilled Nursing facility    Frequency and Duration        Pertinent Vitals/Pain NA    SLP Swallow Goals     General HPI: Amy Martin is a 78 y/o woman arriving for an outpatient MBS for a diet upgrade from nectar thick liquids and dys 1 (puree) texture. Pt has a history of dementia, anemia 2/2 colonic polyp, TIA and CVA. She was admitted to [facility] in 10/13 for a fall and a new dense left hemi-paresis. MRI showed moderate size acute right hemispheric infarct involving the right opercular region, right sub insular region, right frontal lobe and junction with the right parietal lobe. Left supraorbital subcutaneous hematoma/soft tissue swelling. Broad-based right parietal - occipital - posterior right temporal subdural hematoma measuring up to 4.4 mm. Prior to admission she developed bleeding from a laceration  on her lip sustained during her fall, as well as bleeding into the soft tissue of her left eye and chest where she had sustained bruising from her fall. SLP initiated a Dys 1 nectar thick liquid diet prior to d/c to SNF.  Type of Study: Modified Barium Swallowing Study Reason for  Referral: Objectively evaluate swallowing function Previous Swallow Assessment: BSE 10/13 - NPO Diet Prior to this Study: Dysphagia 1 (puree);Nectar-thick liquids Temperature Spikes Noted: N/A Respiratory Status: Room air Behavior/Cognition: Alert;Requires cueing;Cooperative Oral Cavity - Dentition: Dentures, top;Dentures, not available Oral Motor / Sensory Function: Impaired motor Oral impairment: Left facial;Left labial;Left lingual Self-Feeding Abilities: Needs assist Patient Positioning: Upright in chair Baseline Vocal Quality: Hoarse Volitional Cough: Strong Volitional Swallow: Able to elicit Anatomy: Within functional limits    Reason for Referral Objectively evaluate swallowing function   Oral Phase Oral Preparation/Oral Phase Oral Phase: Impaired Oral - Nectar Oral - Nectar Cup: Left anterior bolus loss;Right anterior bolus loss;Reduced posterior propulsion;Delayed oral transit Oral - Thin Oral - Thin Cup: Left anterior bolus loss;Right anterior bolus loss;Reduced posterior propulsion;Delayed oral transit Oral - Thin Straw: Left anterior bolus loss;Right anterior bolus loss;Reduced posterior propulsion;Delayed oral transit Oral - Solids Oral - Puree: Left anterior bolus loss;Right anterior bolus loss;Reduced posterior propulsion;Delayed oral transit Oral - Mechanical Soft: Left anterior bolus loss;Right anterior bolus loss;Reduced posterior propulsion;Delayed oral transit;Impaired mastication;Weak lingual manipulation;Piecemeal swallowing   Pharyngeal Phase Pharyngeal Phase Pharyngeal Phase: Impaired Pharyngeal - Nectar Pharyngeal - Nectar Cup: Delayed swallow initiation;Premature spillage to pyriform sinuses;Penetration/Aspiration before swallow Penetration/Aspiration details (nectar cup): Material enters airway, remains ABOVE vocal cords then ejected out Pharyngeal - Thin Pharyngeal - Thin Cup: Delayed swallow initiation;Premature spillage to pyriform  sinuses;Penetration/Aspiration before swallow;Trace aspiration Penetration/Aspiration details (thin cup): Material enters airway, remains ABOVE vocal cords then ejected out;Material enters airway, CONTACTS cords then ejected out;Material enters airway, passes BELOW cords then ejected out;Material enters airway, passes BELOW cords without attempt by patient to eject out (silent aspiration) Pharyngeal - Thin Straw: Delayed swallow initiation;Premature spillage to pyriform sinuses;Penetration/Aspiration before swallow Penetration/Aspiration details (thin straw): Material enters airway, passes BELOW cords without attempt by patient to eject out (silent aspiration);Material does not enter airway Pharyngeal - Solids Pharyngeal - Puree: Premature spillage to valleculae Pharyngeal - Mechanical Soft: Premature spillage to valleculae  Cervical Esophageal Phase    GO    Cervical Esophageal Phase Cervical Esophageal Phase: Impaired Cervical Esophageal Phase - Comment Cervical Esophageal Comment: Appearance of solid stasis in distal esophagus, easily clearing with 1-2 sips of liquids. No radiologist present to confirm.         Harlon Ditty, [place] CCC-SLP [phone]  Claudine Mouton 03/11/2012, 1:56 PM

## 2012-03-16 NOTE — Progress Notes (Signed)
03/11/12 1300  SLP G-Codes **NOT FOR INPATIENT CLASS**  Functional Assessment Tool Used clinical judgment  Functional Limitations Swallowing  Swallow Current Status (Z6109) CJ  Swallow Goal Status (U0454) CJ  Swallow Discharge Status (U9811) CJ  SLP Evaluations  $ SLP Speech Visit 1 Procedure  SLP Evaluations  $MBS Swallow 1 Procedure  $Swallowing Treatment 1 Procedure

## 2012-08-12 ENCOUNTER — Non-Acute Institutional Stay (SKILLED_NURSING_FACILITY): Payer: Medicare Other | Admitting: Internal Medicine

## 2012-08-12 DIAGNOSIS — D509 Iron deficiency anemia, unspecified: Secondary | ICD-10-CM

## 2012-08-12 DIAGNOSIS — I10 Essential (primary) hypertension: Secondary | ICD-10-CM

## 2012-08-12 DIAGNOSIS — I6990 Unspecified sequelae of unspecified cerebrovascular disease: Secondary | ICD-10-CM

## 2012-08-12 DIAGNOSIS — F0390 Unspecified dementia without behavioral disturbance: Secondary | ICD-10-CM

## 2012-08-13 DIAGNOSIS — D509 Iron deficiency anemia, unspecified: Secondary | ICD-10-CM | POA: Insufficient documentation

## 2012-08-13 DIAGNOSIS — I10 Essential (primary) hypertension: Secondary | ICD-10-CM | POA: Insufficient documentation

## 2012-08-13 DIAGNOSIS — F0390 Unspecified dementia without behavioral disturbance: Secondary | ICD-10-CM | POA: Insufficient documentation

## 2012-08-13 DIAGNOSIS — I6990 Unspecified sequelae of unspecified cerebrovascular disease: Secondary | ICD-10-CM | POA: Insufficient documentation

## 2012-08-13 NOTE — Progress Notes (Signed)
PROGRESS NOTE  DATE: 08/12/2012  FACILITY: Nursing Home Location: [facility]  LEVEL OF CARE: SNF (31)  Routine Visit  CHIEF COMPLAINT:  Manage hypertension and CVA  HISTORY OF PRESENT ILLNESS:  REASSESSMENT OF ONGOING PROBLEM(S):  HTN: Pt 's HTN remains stable.  Denies CP, sob, DOE, pedal edema, headaches, dizziness or visual disturbances.  No complications from the medications currently being used.  Last BP : 156/63.  2. CVA: The patient's CVA remains stable.  Patient denies new neurologic symptoms such as numbness, tingling, weakness, speech difficulties or visual disturbances.  No complications reported from the medications currently being used.  PAST MEDICAL HISTORY : Reviewed.  No changes.  CURRENT MEDICATIONS: Reviewed per [facility]  REVIEW OF SYSTEMS:  GENERAL: no change in appetite, no fatigue, no weight changes, no fever, chills or weakness RESPIRATORY: no cough, SOB, DOE, wheezing, hemoptysis CARDIAC: no chest pain, edema or palpitations GI: no abdominal pain, diarrhea, constipation, heart burn, nausea or vomiting  PHYSICAL EXAMINATION  VS:  T 98       P 58      RR 22      BP     POX %     WT (Lb) 124.4  GENERAL: no acute distress, normal body habitus EYES: conjunctivae normal, sclerae normal, normal eye lids NECK: supple, trachea midline, no neck masses, no thyroid tenderness, no thyromegaly LYMPHATICS: no LAN in the neck, no supraclavicular LAN RESPIRATORY: breathing is even & unlabored, BS CTAB CARDIAC: RRR, no murmur,no extra heart sounds, no edema GI: abdomen soft, normal BS, no masses, no tenderness, no hepatomegaly, no splenomegaly PSYCHIATRIC: the patient is alert & oriented to person, affect & behavior appropriate  LABS/RADIOLOGY:  11/13 hemoglobin 8.4, MCV 84.2 otherwise CBC normal, albumin 2.4, total protein 4.8 otherwise CMP normal  ASSESSMENT/PLAN:  1. CVA-stable. 2. hypertension-blood pressure elevated. will review a  log. 3. dementia-stable. 4. iron deficiency anemia-hemoglobin stable. 5. constipation-well-controlled. 6. allergic rhinitis-well-controlled. 7. GERD-stable. 8. check CBC and CMP.  CPT CODE: 40981

## 2012-08-31 ENCOUNTER — Non-Acute Institutional Stay (SKILLED_NURSING_FACILITY): Payer: Medicare Other | Admitting: Internal Medicine

## 2012-08-31 DIAGNOSIS — D509 Iron deficiency anemia, unspecified: Secondary | ICD-10-CM

## 2012-08-31 DIAGNOSIS — I639 Cerebral infarction, unspecified: Secondary | ICD-10-CM

## 2012-08-31 DIAGNOSIS — F0390 Unspecified dementia without behavioral disturbance: Secondary | ICD-10-CM

## 2012-08-31 DIAGNOSIS — I6350 Cerebral infarction due to unspecified occlusion or stenosis of unspecified cerebral artery: Secondary | ICD-10-CM

## 2012-08-31 DIAGNOSIS — I10 Essential (primary) hypertension: Secondary | ICD-10-CM

## 2012-09-02 NOTE — Progress Notes (Signed)
PROGRESS NOTE  DATE: 08-31-12   FACILITY: Nursing Home Location: [facility]  LEVEL OF CARE: SNF (31)  Routine Visit  CHIEF COMPLAINT:  Manage hypertension, dementia and CVA  HISTORY OF PRESENT ILLNESS:  REASSESSMENT OF ONGOING PROBLEM(S):  HTN: Pt 's HTN remains stable.  Staff Deny CP, sob, DOE, pedal edema, headaches, dizziness or visual disturbances.  No complications from the medications currently being used.  Last BP : 149/65,156/63.  2. CVA: The patient's CVA remains stable. staff deny new neurologic symptoms such as numbness, tingling, weakness, speech difficulties or visual disturbances.  No complications reported from the medications currently being used.  3. DEMENTIA: The dementia remaines stable and continues to function adequately in the current living environment with supervision.  The patient has had little changes in behavior. No complications noted from the medications presently being used. Patient is a poor historian.  PAST MEDICAL HISTORY : Reviewed.  No changes.  CURRENT MEDICATIONS: Reviewed per [facility]  REVIEW OF SYSTEMS: Unobtainable due to dementia.  PHYSICAL EXAMINATION  VS:  T 97.1      P 74      RR 18      BP     POX %     WT (Lb) 124.4  GENERAL: no acute distress, normal body habitus EYES: conjunctivae normal, sclerae normal, normal eye lids NECK: supple, trachea midline, no neck masses, no thyroid tenderness, no thyromegaly LYMPHATICS: no LAN in the neck, no supraclavicular LAN RESPIRATORY: breathing is even & unlabored, BS CTAB CARDIAC: RRR, no murmur,no extra heart sounds, no edema GI: abdomen soft, normal BS, no masses, no tenderness, no hepatomegaly, no splenomegaly PSYCHIATRIC: the patient is alert & disoriented, affect & behavior appropriate  LABS/RADIOLOGY:  5/14 hemoglobin 9.2, MCV 81.5 otherwise CBC normal  11/13 hemoglobin 8.4, MCV 84.2 otherwise CBC normal, albumin 2.4, total protein 4.8 otherwise CMP  normal  ASSESSMENT/PLAN:  1. CVA-stable. 2. hypertension-blood pressure elevated. Start lisinopril 10 mg daily. 3. dementia-stable. 4. iron deficiency anemia-hemoglobin stable. 5. constipation-well-controlled. 6. allergic rhinitis-well-controlled. 7. GERD-stable. 8. check CMP on 6-9.  CPT CODE: 16109

## 2012-10-05 ENCOUNTER — Non-Acute Institutional Stay (SKILLED_NURSING_FACILITY): Payer: Medicare Other | Admitting: Internal Medicine

## 2012-10-05 DIAGNOSIS — K649 Unspecified hemorrhoids: Secondary | ICD-10-CM

## 2012-10-05 DIAGNOSIS — D638 Anemia in other chronic diseases classified elsewhere: Secondary | ICD-10-CM

## 2012-10-13 ENCOUNTER — Encounter: Payer: Self-pay | Admitting: Nurse Practitioner

## 2012-10-13 ENCOUNTER — Non-Acute Institutional Stay (SKILLED_NURSING_FACILITY): Payer: Medicare Other | Admitting: Nurse Practitioner

## 2012-10-13 DIAGNOSIS — I6990 Unspecified sequelae of unspecified cerebrovascular disease: Secondary | ICD-10-CM

## 2012-10-13 DIAGNOSIS — D649 Anemia, unspecified: Secondary | ICD-10-CM

## 2012-10-13 DIAGNOSIS — F0390 Unspecified dementia without behavioral disturbance: Secondary | ICD-10-CM

## 2012-10-13 DIAGNOSIS — I10 Essential (primary) hypertension: Secondary | ICD-10-CM

## 2012-10-13 NOTE — Progress Notes (Signed)
Patient ID: Amy Martin, female   DOB: [birth year], 79 y.o.   MRN: [id number]  Nursing Home Location:  [facility]   Place of Service: SNF [phone])  Chief Complaint  Patient presents with  . Medical Managment of Chronic Issues    HPI:  79 year old female who is a long term resident of [facility] is being seen today for routine follow up. Pt with PMH of CVA, HTN, anemia, constipation, GERD, and advanced dementia. Pt with recent addition of lisinopril due to worsening blood pressure. Pt unable to participate in ROS or HPI. Staff is without concerns at this time.   Review of Systems:  Review of Systems  Unable to perform ROS: dementia    Medications: Patient's Medications  New Prescriptions   No medications on file  Previous Medications   ACETAMINOPHEN (TYLENOL) 325 MG TABLET    Take 650 mg by mouth every 4 (four) hours as needed. For pain   ASPIRIN EC 325 MG TABLET    Take 325 mg by mouth daily.   ATORVASTATIN (LIPITOR) 40 MG TABLET    Take 40 mg by mouth daily.   CITALOPRAM (CELEXA) 20 MG TABLET    Take 10 mg by mouth every morning.    FERROUS SULFATE 325 (65 FE) MG TABLET    Take 325 mg by mouth daily with breakfast.    FUROSEMIDE (LASIX) 20 MG TABLET    Take 20 mg by mouth.   HYDRALAZINE (APRESOLINE) 25 MG TABLET    Take 25 mg by mouth 4 (four) times daily.    LORATADINE (CLARITIN) 10 MG TABLET    Take 10 mg by mouth daily.   METOPROLOL (LOPRESSOR) 100 MG TABLET    Take 100 mg by mouth daily.   MULTIPLE VITAMIN (MULTIVITAMIN WITH MINERALS) TABS    Take 1 tablet by mouth daily.   OMEPRAZOLE (PRILOSEC) 40 MG CAPSULE    Take 40 mg by mouth daily.   POLYETHYLENE GLYCOL (MIRALAX / GLYCOLAX) PACKET    Take 17 g by mouth daily.  Modified Medications   No medications on file  Discontinued Medications   LACTOBACILLUS ACIDOPHILUS (BACID) TABS    Take 2 tablets by mouth every 12 (twelve) hours.     Physical Exam:  Filed Vitals:   10/13/12 1101   BP: 149/79  Pulse: 61  Temp: 97 F (36.1 C)  Resp: 16  Weight: 117 lb (53.071 kg)   Physical Exam  Constitutional:  Thin AA female in NAD  HENT:  Head: Normocephalic and atraumatic.  Eyes: Conjunctivae and EOM are normal. Pupils are equal, round, and reactive to light.  Neck: Normal range of motion. Neck supple.  Cardiovascular: Normal rate, regular rhythm, normal heart sounds and intact distal pulses.   Pulmonary/Chest: Effort normal and breath sounds normal. No respiratory distress.  Abdominal: Soft. Bowel sounds are normal. She exhibits no distension. There is no tenderness.  Musculoskeletal: She exhibits no edema and no tenderness.  Neurological: She is alert.  Skin: Skin is warm and dry.     Labs reviewed/Significant Diagnostic Results: 09/28/12: wbc 7.1, rbc 3.59, hgb 10.0, hct 29.8, plt 197 09/09/12: sodium 140, potassium 4.1, glucose 112, BUN 24, Cr 1.04   Assessment/Plan  1. CVA-unchanged.  2. hypertension- lisinopril recently added- will change metoprolol to 50 mg BID and will check bmp 3. dementia-advanced- not currently on any medications  4. iron deficiency anemia-hemoglobin stable at this time

## 2012-10-31 NOTE — Progress Notes (Signed)
Patient ID: Amy Martin, female   DOB: [birth year], 79 y.o.   MRN: [id number]        PROGRESS NOTE  DATE: 10/05/2012  FACILITY:  [facility]  LEVEL OF CARE: SNF (31)  Acute Visit  CHIEF COMPLAINT:  Manage anemia of chronic disease and hemorrhoids.    HISTORY OF PRESENT ILLNESS: I was requested by the staff to assess the patient regarding above problem(s):  ANEMIA: The anemia has been stable. The staff denies fatigue, melena or hematochezia. No complications from the medications currently being used.  Patient is a poor historian.  On 09/28/2012:  Hemoglobin 10, MCV 83.  In 07/2012:  Hemoglobin 9.4.    HEMORRHOIDS:  Staff report that patient has intermittent hemorrhoidal bleeding which has increased over the last week.    PAST MEDICAL HISTORY : Reviewed.  No changes.  CURRENT MEDICATIONS: Reviewed per [facility]  REVIEW OF SYSTEMS:  Unobtainable due to dementia.    PHYSICAL EXAMINATION  GENERAL: no acute distress, normal body habitus NECK: supple, trachea midline, no neck masses, no thyroid tenderness, no thyromegaly RESPIRATORY: breathing is even & unlabored, BS CTAB CARDIAC: RRR, no murmur,no extra heart sounds, no edema GI: abdomen soft, normal BS, no masses, no tenderness, no hepatomegaly, no splenomegaly RECTAL: exam deferred PSYCHIATRIC: the patient is alert & oriented to person, affect & behavior appropriate  ASSESSMENT/PLAN:  Anemia of chronic disease.  Hemoglobin improved.     External hemorrhoids.  We will use Anusol cream q.d. for two weeks.    CPT CODE: 40981

## 2012-11-02 DIAGNOSIS — K649 Unspecified hemorrhoids: Secondary | ICD-10-CM | POA: Insufficient documentation

## 2012-11-02 DIAGNOSIS — D649 Anemia, unspecified: Secondary | ICD-10-CM | POA: Insufficient documentation

## 2012-11-16 ENCOUNTER — Non-Acute Institutional Stay (SKILLED_NURSING_FACILITY): Payer: Medicare Other | Admitting: Internal Medicine

## 2012-11-16 DIAGNOSIS — R4182 Altered mental status, unspecified: Secondary | ICD-10-CM

## 2012-11-16 DIAGNOSIS — D638 Anemia in other chronic diseases classified elsewhere: Secondary | ICD-10-CM

## 2012-11-16 DIAGNOSIS — N179 Acute kidney failure, unspecified: Secondary | ICD-10-CM

## 2012-11-16 DIAGNOSIS — R739 Hyperglycemia, unspecified: Secondary | ICD-10-CM

## 2012-11-16 DIAGNOSIS — R7309 Other abnormal glucose: Secondary | ICD-10-CM

## 2012-11-26 DIAGNOSIS — R5381 Other malaise: Secondary | ICD-10-CM

## 2012-11-27 ENCOUNTER — Non-Acute Institutional Stay (SKILLED_NURSING_FACILITY): Payer: Medicare Other | Admitting: Nurse Practitioner

## 2012-11-27 DIAGNOSIS — R5381 Other malaise: Secondary | ICD-10-CM

## 2012-11-27 NOTE — Progress Notes (Addendum)
Patient ID: Amy Martin, female   DOB: 20-Sep-1919, 79 y.o.   MRN: [id number]  Nursing Home Location:    [facility]  Place of Service:  SNF  DOS: 11/12/12  Chief Complaint  Patient presents with  . Acute Visit    HPI:  79 year old female asked to be seen today by nursing. Family member reports pt was not acting like her normal self. Pt with PHM of anemia, CVA , dementia, HTN. Clean catch Urine which sent off which revealed 70,000 colonies. Pt does not complaint of any pain, shortness of breath, chest pain, pain with urination, pt does have constipation per family No noted changes per nursing, denies changes in appetite and VSS Review of Systems:  Unable to obtain due to dementia  Medications: Patient's Medications  New Prescriptions   No medications on file  Previous Medications   ACETAMINOPHEN (TYLENOL) 325 MG TABLET    Take 650 mg by mouth every 4 (four) hours as needed. For pain   ASPIRIN EC 325 MG TABLET    Take 325 mg by mouth daily.   ATORVASTATIN (LIPITOR) 40 MG TABLET    Take 40 mg by mouth daily.   CITALOPRAM (CELEXA) 20 MG TABLET    Take 10 mg by mouth every morning.    FERROUS SULFATE 325 (65 FE) MG TABLET    Take 325 mg by mouth daily with breakfast.    FUROSEMIDE (LASIX) 20 MG TABLET    Take 20 mg by mouth.   HYDRALAZINE (APRESOLINE) 25 MG TABLET    Take 25 mg by mouth 4 (four) times daily.    LORATADINE (CLARITIN) 10 MG TABLET    Take 10 mg by mouth daily.   METOPROLOL (LOPRESSOR) 100 MG TABLET    Take 100 mg by mouth daily.   MULTIPLE VITAMIN (MULTIVITAMIN WITH MINERALS) TABS    Take 1 tablet by mouth daily.   OMEPRAZOLE (PRILOSEC) 40 MG CAPSULE    Take 40 mg by mouth daily.   POLYETHYLENE GLYCOL (MIRALAX / GLYCOLAX) PACKET    Take 17 g by mouth daily.  Modified Medications   No medications on file  Discontinued Medications   No medications on file     Physical Exam:  Filed Vitals:   11/12/12 1521  BP: 143/70  Pulse: 76  Temp: 97.1 F  (36.2 C)  Resp: 20   GENERAL APPEARANCE: Alert, conversant. Appropriately groomed. No acute distress.  SKIN: No diaphoresis rash, or wounds HEAD: Normocephalic, atraumatic  EYES: Conjunctiva/lids clear. Pupils round, reactive. EOMs intact.  EARS: External exam WNL. Hearing grossly normal.  NOSE: No deformity or discharge.  MOUTH/THROAT: Lips w/o lesions. Mouth and throat normal. Tongue moist, w/o lesion.  NECK: No thyroid tenderness, enlargement or nodule  RESPIRATORY: Breathing is even, unlabored. Lung sounds are clear   CARDIOVASCULAR: Heart RRR no murmurs, rubs or gallops. No peripheral edema.  ARTERIAL: radial pulse 2+, DP pulse 1+  VENOUS: No varicosities. No venous stasis skin changes  GASTROINTESTINAL: Abdomen is soft, non-tender, not distended w/ normal bowel sounds. GENITOURINARY: Bladder non tender, not distended  MUSCULOSKELETAL: No abnormal joints or musculature NEUROLOGIC: Oriented to self. Cranial nerves 2-12 grossly intact. Moves all extremities no tremor. PSYCHIATRIC: Mood and affect appropriate to situation, no behavioral issues    Assessment/Plan  AMS/ fatigue Pt appears at baseline to me however daughter and niece both report changes they have noticed. Will give pt senakot S daily and have staff closely monitor for constipation Will  get CBC with diff, TSH< BMP CXR to rule out pneumonia Will get VS q 8 hours  Urine negative for UTI- may need recollection if mental status does not improve

## 2012-12-08 ENCOUNTER — Non-Acute Institutional Stay (SKILLED_NURSING_FACILITY): Payer: Medicare Other | Admitting: Internal Medicine

## 2012-12-08 DIAGNOSIS — F0390 Unspecified dementia without behavioral disturbance: Secondary | ICD-10-CM

## 2012-12-08 DIAGNOSIS — I10 Essential (primary) hypertension: Secondary | ICD-10-CM

## 2012-12-08 DIAGNOSIS — I6350 Cerebral infarction due to unspecified occlusion or stenosis of unspecified cerebral artery: Secondary | ICD-10-CM

## 2012-12-08 DIAGNOSIS — I639 Cerebral infarction, unspecified: Secondary | ICD-10-CM

## 2012-12-08 DIAGNOSIS — D509 Iron deficiency anemia, unspecified: Secondary | ICD-10-CM

## 2012-12-08 NOTE — Progress Notes (Signed)
PROGRESS NOTE  DATE: 12-08-12   FACILITY: Nursing Home Location: [facility]  LEVEL OF CARE: SNF (31)  Routine Visit  CHIEF COMPLAINT:  Manage hypertension, dementia and CVA  HISTORY OF PRESENT ILLNESS:  REASSESSMENT OF ONGOING PROBLEM(S):  HTN: Pt 's HTN remains stable.  Staff Deny CP, sob, DOE, pedal edema, headaches, dizziness or visual disturbances.  No complications from the medications currently being used.  Last BP : 149/65,156/63, 140/62.  CVA: The patient's CVA remains stable. staff deny new neurologic symptoms such as numbness, tingling, weakness, speech difficulties or visual disturbances.  No complications reported from the medications currently being used.  DEMENTIA: The dementia remaines stable and continues to function adequately in the current living environment with supervision.  The patient has had little changes in behavior. No complications noted from the medications presently being used. Patient is a poor historian.  PAST MEDICAL HISTORY : Reviewed.  No changes.  CURRENT MEDICATIONS: Reviewed per [facility]  REVIEW OF SYSTEMS: Unobtainable due to dementia.  PHYSICAL EXAMINATION  VS:  T 97.1      P 68     RR 16     BP     POX %     WT (Lb) 127  GENERAL: no acute distress, normal body habitus EYES: conjunctivae normal, sclerae normal, normal eye lids NECK: supple, trachea midline, no neck masses, no thyroid tenderness, no thyromegaly LYMPHATICS: no LAN in the neck, no supraclavicular LAN RESPIRATORY: breathing is even & unlabored, BS CTAB CARDIAC: RRR, no murmur,no extra heart sounds, no edema GI: abdomen soft, normal BS, no masses, no tenderness, no hepatomegaly, no splenomegaly PSYCHIATRIC: the patient is alert & disoriented, affect & behavior appropriate  LABS/RADIOLOGY:  8-14 hemoglobin 10.6, MCV 86.2 otherwise CBC normal, BUN 30, glucose 126 otherwise BMP normal, TSH 0.345  5/14 hemoglobin 9.2, MCV 81.5 otherwise CBC  normal  11/13 hemoglobin 8.4, MCV 84.2 otherwise CBC normal, albumin 2.4, total protein 4.8 otherwise CMP normal  ASSESSMENT/PLAN:  CVA-stable. hypertension-blood pressure elevated. Increase lisinopril to 20 mg daily. dementia-stable. iron deficiency anemia-hemoglobin stable. constipation-well-controlled. allergic rhinitis-well-controlled. GERD-stable. Decreased TSH-new problem. Check free T3 and free T4 check CMP in 2 days  CPT CODE: 16109

## 2012-12-14 ENCOUNTER — Non-Acute Institutional Stay (SKILLED_NURSING_FACILITY): Payer: Medicare Other | Admitting: Family

## 2012-12-14 ENCOUNTER — Encounter: Payer: Self-pay | Admitting: Family

## 2012-12-14 DIAGNOSIS — N179 Acute kidney failure, unspecified: Secondary | ICD-10-CM

## 2012-12-14 DIAGNOSIS — R4182 Altered mental status, unspecified: Secondary | ICD-10-CM | POA: Insufficient documentation

## 2012-12-14 DIAGNOSIS — R739 Hyperglycemia, unspecified: Secondary | ICD-10-CM | POA: Insufficient documentation

## 2012-12-14 NOTE — Progress Notes (Signed)
Patient ID: Amy Martin, female   DOB: [birth year], 79 y.o.   MRN: [id number]        PROGRESS NOTE  DATE: 11/16/2012  FACILITY:  [facility]  LEVEL OF CARE: SNF (31)  Acute Visit  CHIEF COMPLAINT:  Manage altered mental status, acute renal failure, and hyperglycemia.        HISTORY OF PRESENT ILLNESS: I was requested by the staff to assess the patient regarding above problem(s):  ALTERED MENTAL STATUS:  The patient's family is concerned that the patient is increasingly confused and lethargic over the last week.  She has been started on Macrobid for a UTI.  The patient is lethargic and does not follow commands.    ACUTE RENAL FAILURE:  On 11/15/2012:  BUN 55, creatinine 1.45.  In 09/2012:  BUN 33, creatinine 1.24.  Patient is  currently not on renal toxic medications.  She is having increasing confusion, but staff do not report increasing swelling.    HYPERGLYCEMIA:  New problem.  On 11/15/2012:  Blood glucose level 127.  She does not have a history of diabetes mellitus and she is not on prednisone.    PAST MEDICAL HISTORY : Reviewed.  No changes.  CURRENT MEDICATIONS: Reviewed per [facility]  REVIEW OF SYSTEMS:  Unobtainable.  Patient is lethargic.    PHYSICAL EXAMINATION  VS:  T 98.1      P 51      RR 18      BP 140/56     POX 91% room air       WT (Lb)  GENERAL: no acute distress, normal body habitus EYES: unable to assess   NECK: supple, trachea midline, no neck masses, no thyroid tenderness, no thyromegaly LYMPHATICS: no LAN in the neck, no supraclavicular LAN RESPIRATORY: decreased breath sounds bilaterally, occasional wheezing   CARDIAC: RRR, no murmur,no extra heart sounds, no edema GI: abdomen soft, BS diminished, no masses, no tenderness, no hepatomegaly, no splenomegaly PSYCHIATRIC: the patient is alert, unable to assess orientation, decreased affect and mood, the patient is lethargic      LABS/RADIOLOGY: 11/15/2012:  Hemoglobin 9.2, MCV 84.5.     09/2012:  Hemoglobin 10.    ASSESSMENT/PLAN:  Altered mental status.  New onset.  Significant problem.  Continue Macrobid.  Obtain culture and sensitivities of the urine results.   Also, obtain chest x-ray.  Check TSH.   Currently, she is not on any medications that could cause her lethargy.    Acute renal failure.  New onset.  Significant problem.  Push fluids and reassess.  She is not on renal toxic medications.    Hyperglycemia.  New problem.  Check fasting glucose level and hemoglobin A1c.    Anemia of chronic disease.  Unstable problem.  Hemoglobin declined.  We will monitor.    CPT CODE: 44010

## 2012-12-14 NOTE — Progress Notes (Signed)
Patient ID: Amy Martin, female   DOB: October 12, 1919, 79 y.o.   MRN: [id number] Date: 12/14/2012  Chief Complaint  Patient presents with  . Acute Visit    Follow up for abnormal labs     HPI:  The patient is being followed for the medical management of chronic illnesses.  Pt presents with elevated creatinine level of 1.17 and BUN 32..  Pt and nursing staff denies N/V/D, fever, chills or flank pain.  Nursing staff and patient denies further concerns at present.    No Known Allergies    Medication List       This list is accurate as of: 12/14/12  9:28 PM.  Always use your most recent med list.               acetaminophen 325 MG tablet  Commonly known as:  TYLENOL  Take 650 mg by mouth every 4 (four) hours as needed. For pain     aspirin EC 325 MG tablet  Take 325 mg by mouth daily.     atorvastatin 40 MG tablet  Commonly known as:  LIPITOR  Take 40 mg by mouth daily.     citalopram 20 MG tablet  Commonly known as:  CELEXA  Take 10 mg by mouth every morning.     ferrous sulfate 325 (65 FE) MG tablet  Take 325 mg by mouth daily with breakfast.     furosemide 20 MG tablet  Commonly known as:  LASIX  Take 20 mg by mouth.     hydrALAZINE 25 MG tablet  Commonly known as:  APRESOLINE  Take 25 mg by mouth 4 (four) times daily.     loratadine 10 MG tablet  Commonly known as:  CLARITIN  Take 10 mg by mouth daily.     metoprolol 100 MG tablet  Commonly known as:  LOPRESSOR  Take 100 mg by mouth daily.     multivitamin with minerals Tabs tablet  Take 1 tablet by mouth daily.     omeprazole 40 MG capsule  Commonly known as:  PRILOSEC  Take 40 mg by mouth daily.     polyethylene glycol packet  Commonly known as:  MIRALAX / GLYCOLAX  Take 17 g by mouth daily.         DATA REVIEWED  Laboratory Studies: Sodium 140, Potassium 4.1, Chloride 112, Albumin 3.0, Glucose 134, BUN 32, Creatinine 1.17     Past Medical History  Diagnosis Date  . Cancer   . Stroke   .  Diabetes mellitus   . Dementia     History reviewed. No pertinent past surgical history.   History   Social History  . Marital Status: Single    Spouse Name: N/A    Number of Children: N/A  . Years of Education: N/A   Occupational History  . Not on file.   Social History Main Topics  . Smoking status: Former Smoker    Types: Cigarettes    Quit date: 01/04/1979  . Smokeless tobacco: Not on file  . Alcohol Use: No  . Drug Use:   . Sexual Activity: No   Other Topics Concern  . Not on file   Social History Narrative  . No narrative on file     Review of Systems Unable to obtain ROS d/t advanced demential  Physical Exam Blood pressure 106/58, pulse 62, temperature 97.3 F (36.3 C), resp. rate 18. Physical Exam  Constitutional: No distress.  HENT:  Head: Normocephalic.  Mouth/Throat: Oropharynx  is clear and moist.  Cardiovascular: Normal rate and regular rhythm.   Pulmonary/Chest: Effort normal. No respiratory distress.  Abdominal: Soft. Bowel sounds are normal.  Neurological: She is alert.  Disoriented with short-term deficits  Skin: Skin is warm and dry.     ASSESSMENT/PLAN  Acute Renal Failure-Encourage fluid intake, recheck BMET in 2 weeks  Follow up: in 2 weeks

## 2012-12-30 ENCOUNTER — Non-Acute Institutional Stay (SKILLED_NURSING_FACILITY): Payer: Medicare Other | Admitting: Family

## 2012-12-30 DIAGNOSIS — I208 Other forms of angina pectoris: Secondary | ICD-10-CM

## 2012-12-30 DIAGNOSIS — I209 Angina pectoris, unspecified: Secondary | ICD-10-CM

## 2012-12-30 DIAGNOSIS — M940 Chondrocostal junction syndrome [Tietze]: Secondary | ICD-10-CM

## 2012-12-30 NOTE — Progress Notes (Signed)
Patient ID: Amy Martin, female   DOB: 16-Nov-1919, 79 y.o.   MRN: [id number] Date: 12/30/12  Facility: [facility]  Code Status:  DNR  Chief Complaint  Patient presents with  . Acute Visit    Chest Pain     HPI: Pt reports intermittent midsternal CP that resolves with rest and medication.  Pt reports onset of CP was >1 year ago. Pt denies N/V/D/fever/chills, radiating CP,Edema, SOB or fatigue.  Pt reports associated symptoms of HA.  Pt and nursing staff denies further concerns/issues at present.      No Known Allergies   Medication List       This list is accurate as of: 12/30/12  4:36 PM.  Always use your most recent med list.               acetaminophen 325 MG tablet  Commonly known as:  TYLENOL  Take 650 mg by mouth every 4 (four) hours as needed. For pain     aspirin EC 325 MG tablet  Take 325 mg by mouth daily.     atorvastatin 40 MG tablet  Commonly known as:  LIPITOR  Take 40 mg by mouth daily.     citalopram 20 MG tablet  Commonly known as:  CELEXA  Take 10 mg by mouth every morning.     ferrous sulfate 325 (65 FE) MG tablet  Take 325 mg by mouth daily with breakfast.     furosemide 20 MG tablet  Commonly known as:  LASIX  Take 20 mg by mouth.     hydrALAZINE 25 MG tablet  Commonly known as:  APRESOLINE  Take 25 mg by mouth 4 (four) times daily.     loratadine 10 MG tablet  Commonly known as:  CLARITIN  Take 10 mg by mouth daily.     metoprolol 100 MG tablet  Commonly known as:  LOPRESSOR  Take 100 mg by mouth daily.     multivitamin with minerals Tabs tablet  Take 1 tablet by mouth daily.     omeprazole 40 MG capsule  Commonly known as:  PRILOSEC  Take 40 mg by mouth daily.     polyethylene glycol packet  Commonly known as:  MIRALAX / GLYCOLAX  Take 17 g by mouth daily.         DATA REVIEWED  12/09/12 Laboratory Studies: Sodium 140, Potassium 4.1, Chloride 112, Albumin 3.0, Glucose 134, BUN 32, Creatinine 1.17   Past Medical  History  Diagnosis Date  . Cancer   . Stroke   . Diabetes mellitus   . Dementia     No past surgical history on file.   History   Social History  . Marital Status: Single    Spouse Name: N/A    Number of Children: N/A  . Years of Education: N/A   Occupational History  . Not on file.   Social History Main Topics  . Smoking status: Former Smoker    Types: Cigarettes    Quit date: 01/04/1979  . Smokeless tobacco: Not on file  . Alcohol Use: No  . Drug Use:   . Sexual Activity: No   Other Topics Concern  . Not on file   Social History Narrative  . No narrative on file   Review of Systems  Constitutional: Negative.   Eyes: Negative.   Respiratory: Positive for shortness of breath.        SOB after transfer to W/C to bed  Cardiovascular: Positive for chest pain.  Gastrointestinal: Positive for heartburn and constipation.  Genitourinary: Positive for urgency.  Musculoskeletal: Positive for neck pain.  Skin: Negative.   Neurological: Positive for headaches.  Endo/Heme/Allergies: Negative.   Psychiatric/Behavioral: Negative.     Physical Exam Filed Vitals:   12/30/12 1633  BP: 117/57  Pulse: 60  Temp: 97.7 F (36.5 C)  Resp: 16   There is no weight on file to calculate BMI. Physical Exam  HENT:  Mouth/Throat: Oropharynx is clear and moist.  Eyes: Conjunctivae are normal. Pupils are equal, round, and reactive to light.  Neck: Normal range of motion. No thyromegaly present.  Cardiovascular: Normal rate, regular rhythm and normal heart sounds.   Pulmonary/Chest: Effort normal and breath sounds normal.  Reproducible CP upon palpation of chest wall  Abdominal: Soft. Bowel sounds are normal.  Neurological: She is alert. GCS eye subscore is 4. GCS verbal subscore is 4. GCS motor subscore is 6.  Short-term memory deficits  Skin: Skin is warm and dry.    ASSESSMENT/PLAN  Stable Angina-Angina is relieved with rest/relaxation and Nitro SL. Will continue to  monitor and collaborate with health care team for treatment effectivenss Costochondritis- Will limit chest wall exertion/ strain by educating staff and patient about d/o.  Will provide analgesics prn and continue to monitor pt status   Follow up:prn

## 2013-01-04 ENCOUNTER — Non-Acute Institutional Stay (SKILLED_NURSING_FACILITY): Payer: Medicare Other | Admitting: Internal Medicine

## 2013-01-04 DIAGNOSIS — N189 Chronic kidney disease, unspecified: Secondary | ICD-10-CM

## 2013-01-04 DIAGNOSIS — I150 Renovascular hypertension: Secondary | ICD-10-CM

## 2013-01-04 DIAGNOSIS — I6990 Unspecified sequelae of unspecified cerebrovascular disease: Secondary | ICD-10-CM

## 2013-01-04 DIAGNOSIS — F0390 Unspecified dementia without behavioral disturbance: Secondary | ICD-10-CM

## 2013-01-07 ENCOUNTER — Non-Acute Institutional Stay (SKILLED_NURSING_FACILITY): Payer: Medicare Other | Admitting: Family

## 2013-01-07 DIAGNOSIS — I209 Angina pectoris, unspecified: Secondary | ICD-10-CM

## 2013-01-07 DIAGNOSIS — I10 Essential (primary) hypertension: Secondary | ICD-10-CM

## 2013-01-07 DIAGNOSIS — I2089 Other forms of angina pectoris: Secondary | ICD-10-CM

## 2013-01-07 DIAGNOSIS — I208 Other forms of angina pectoris: Secondary | ICD-10-CM

## 2013-01-07 NOTE — Progress Notes (Signed)
Patient ID: Amy Martin, female   DOB: [birth year], 79 y.o.   MRN: [id number]  Date: 01/06/13  Facility: [facility]    Chief Complaint  Patient presents with  . Acute Visit    Headache and Chest Pain    HPI: Nurse reports pt c/o acute onset of HA  With associated chest discomfort. Pt reports headache that radiates to the neck with midsternal chest pain.  Pt reports history of headaches, cervical and chest pain.  Pt reports headaches and pain are intermittent; she further endorses the pain typically spontaneously resolves.  Pt denies Nausea, vomiting, fever, chills and diarrhea.  Pt further denies associated blurred vision, aura, and light intolerance.  Pt reports chest pain is mitigated with nitro SL and tums.      No Known Allergies  Current Outpatient Prescriptions on File Prior to Visit  Medication Sig Dispense Refill  . acetaminophen (TYLENOL) 325 MG tablet Take 650 mg by mouth every 4 (four) hours as needed. For pain      . aspirin EC 325 MG tablet Take 325 mg by mouth daily.      Marland Kitchen atorvastatin (LIPITOR) 40 MG tablet Take 40 mg by mouth daily.      . citalopram (CELEXA) 20 MG tablet Take 10 mg by mouth every morning.       . ferrous sulfate 325 (65 FE) MG tablet Take 325 mg by mouth daily with breakfast.       . furosemide (LASIX) 20 MG tablet Take 20 mg by mouth.      . hydrALAZINE (APRESOLINE) 25 MG tablet Take 25 mg by mouth 4 (four) times daily.       Marland Kitchen loratadine (CLARITIN) 10 MG tablet Take 10 mg by mouth daily.      . metoprolol (LOPRESSOR) 100 MG tablet Take 100 mg by mouth daily.      . Multiple Vitamin (MULTIVITAMIN WITH MINERALS) TABS Take 1 tablet by mouth daily.      Marland Kitchen omeprazole (PRILOSEC) 40 MG capsule Take 40 mg by mouth daily.      . polyethylene glycol (MIRALAX / GLYCOLAX) packet Take 17 g by mouth daily.       No current facility-administered medications on file prior to visit.     DATA REVIEWED   Cardiovascular Exams: reviewed  Laboratory  Studies:reviewed     Past Medical History  Diagnosis Date  . Cancer   . Stroke   . Diabetes mellitus   . Dementia      No past surgical history on file.    History   Social History  . Marital Status: Single    Spouse Name: N/A    Number of Children: N/A  . Years of Education: N/A   Occupational History  . Not on file.   Social History Main Topics  . Smoking status: Former Smoker    Types: Cigarettes    Quit date: 01/04/1979  . Smokeless tobacco: Not on file  . Alcohol Use: No  . Drug Use:   . Sexual Activity: No   Other Topics Concern  . Not on file   Social History Narrative  . No narrative on file     Review of Systems  Constitutional: Negative.   HENT: Negative for congestion and sore throat.   Eyes: Negative.   Respiratory: Negative.   Cardiovascular: Positive for chest pain. Negative for palpitations, orthopnea and leg swelling.  Gastrointestinal: Positive for abdominal pain.  Genitourinary: Negative.   Musculoskeletal: Positive for joint  pain.  Skin: Negative.   Neurological: Positive for headaches.  Endo/Heme/Allergies: Negative.   Psychiatric/Behavioral: Negative.      Physical Exam Filed Vitals:   01/07/13 1244  BP: 160/70  Pulse: 74  Temp: 98.2 F (36.8 C)  Resp: 15   There is no weight on file to calculate BMI. Physical Exam  Constitutional: She appears well-developed and well-nourished.  Dressed and groomed appropriately; pt seated in wheelchair  Cardiovascular: Normal rate and regular rhythm.   Manual B/P 110/70, Reproducible CP over L anterior chest wall   Pulmonary/Chest: Effort normal.  Abdominal: Soft. Normal appearance and bowel sounds are normal.  Neurological: She is alert. GCS eye subscore is 4. GCS verbal subscore is 4. GCS motor subscore is 6.  Alert and oriented to self and situation    ASSESSMENT/PLAN  Stable Angina- Nitro SL administered every 5 minutes x 2; Calcium Carbonate (Tums) administered- Eradication  of chest pain obtained Headache-Administered prn Acetaminophen 650 mg po x 1,   Follow up:prn

## 2013-01-11 ENCOUNTER — Non-Acute Institutional Stay (SKILLED_NURSING_FACILITY): Payer: Medicare Other | Admitting: Internal Medicine

## 2013-01-11 DIAGNOSIS — N179 Acute kidney failure, unspecified: Secondary | ICD-10-CM

## 2013-01-14 DIAGNOSIS — I150 Renovascular hypertension: Secondary | ICD-10-CM | POA: Insufficient documentation

## 2013-01-14 DIAGNOSIS — N183 Chronic kidney disease, stage 3 unspecified: Secondary | ICD-10-CM | POA: Insufficient documentation

## 2013-01-14 NOTE — Progress Notes (Signed)
PROGRESS NOTE  DATE: 01-04-13   FACILITY: Nursing Home Location: [facility]  LEVEL OF CARE: SNF (31)  Routine Visit  CHIEF COMPLAINT:  Manage CKD, hypertension, dementia and CVA  HISTORY OF PRESENT ILLNESS:  REASSESSMENT OF ONGOING PROBLEM(S):  CHRONIC KIDNEY DISEASE: The patient's chronic kidney disease remains stable.  Staff  Deny increasing lower extremity swelling or confusion. Last BUN and creatinine are: on 12-30-12 bun 32/cr 1.19, in 9-14 bun 32/cr 1.17.  HTN: Pt 's HTN remains stable.  Staff Deny CP, sob, DOE, pedal edema, headaches, dizziness or visual disturbances.  No complications from the medications currently being used.  Last BP : 149/65,156/63, 140/62, 128/55.  CVA: The patient's CVA remains stable. staff deny new neurologic symptoms such as numbness, tingling, weakness, speech difficulties or visual disturbances.  No complications reported from the medications currently being used.  DEMENTIA: The dementia remaines stable and continues to function adequately in the current living environment with supervision.  The patient has had little changes in behavior. No complications noted from the medications presently being used. Patient is a poor historian.  PAST MEDICAL HISTORY : Reviewed.  No changes.  CURRENT MEDICATIONS: Reviewed per [facility]  REVIEW OF SYSTEMS: Unobtainable due to dementia.  PHYSICAL EXAMINATION  VS:  T 97.1      P 52     RR 18     BP 128/55     POX %     WT (Lb) 126  GENERAL: no acute distress, normal body habitus EYES: conjunctivae normal, sclerae normal, normal eye lids NECK: supple, trachea midline, no neck masses, no thyroid tenderness, no thyromegaly LYMPHATICS: no LAN in the neck, no supraclavicular LAN RESPIRATORY: breathing is even & unlabored, BS CTAB CARDIAC: RRR, no murmur,no extra heart sounds, no edema GI: abdomen soft, normal BS, no masses, no tenderness, no hepatomegaly, no splenomegaly PSYCHIATRIC: the  patient is alert & disoriented, affect & behavior appropriate  LABS/RADIOLOGY:  9-14 glc 134, bun 32, cr 1.17, tp 5.9, alb 3 ow cmp nl, FT4 1.01, FT3 2.2  8-14 hemoglobin 10.6, MCV 86.2 otherwise CBC normal, BUN 30, glucose 126 otherwise BMP normal, TSH 0.345  5/14 hemoglobin 9.2, MCV 81.5 otherwise CBC normal  11/13 hemoglobin 8.4, MCV 84.2 otherwise CBC normal, albumin 2.4, total protein 4.8 otherwise CMP normal  ASSESSMENT/PLAN:  CKD- stable. CVA-stable. hypertension-blood pressure well controlled. dementia-stable. iron deficiency anemia-hemoglobin stable. constipation-well-controlled. allergic rhinitis-well-controlled. GERD-stable.  CPT CODE: 16109

## 2013-02-04 NOTE — Progress Notes (Signed)
Patient ID: Amy Martin, female   DOB: 1920-01-16, 79 y.o.   MRN: [id number]        PROGRESS NOTE  DATE: 01/11/2013  FACILITY:  [facility]  LEVEL OF CARE: SNF (31)  Acute Visit  CHIEF COMPLAINT:  Manage acute renal failure.    HISTORY OF PRESENT ILLNESS: I was requested by the staff to assess the patient regarding above problem(s):  On 01/06/2013:  BUN 31, creatinine 1.31.   On 12/30/2012:  BUN 32, creatinine 1.19.  Patient is on Lasix and lisinopril.  She is a poor historian.  Staff do not report increasing confusion or increasing lower extremity swelling.    PAST MEDICAL HISTORY : Reviewed.  No changes.  CURRENT MEDICATIONS: Reviewed per [facility]  REVIEW OF SYSTEMS:  Unobtainable due to dementia.    PHYSICAL EXAMINATION  GENERAL: no acute distress, normal body habitus NECK: supple, trachea midline, no neck masses, no thyroid tenderness, no thyromegaly RESPIRATORY: breathing is even & unlabored, BS CTAB CARDIAC: RRR, no murmur,no extra heart sounds, no edema GI: abdomen soft, normal BS, no masses, no tenderness, no hepatomegaly, no splenomegaly PSYCHIATRIC: the patient is alert, disoriented, decreased affect and mood    ASSESSMENT/PLAN:  Acute renal failure.  Worsening problem.  Decrease Lasix to 10 mg q.d.  Recheck on 01/14/2013.    CPT CODE: 40981

## 2013-02-14 ENCOUNTER — Non-Acute Institutional Stay (SKILLED_NURSING_FACILITY): Payer: Medicare Other | Admitting: Internal Medicine

## 2013-02-14 ENCOUNTER — Encounter: Payer: Self-pay | Admitting: Internal Medicine

## 2013-02-14 DIAGNOSIS — I150 Renovascular hypertension: Secondary | ICD-10-CM

## 2013-02-14 DIAGNOSIS — N189 Chronic kidney disease, unspecified: Secondary | ICD-10-CM

## 2013-02-14 DIAGNOSIS — I639 Cerebral infarction, unspecified: Secondary | ICD-10-CM

## 2013-02-14 DIAGNOSIS — I6350 Cerebral infarction due to unspecified occlusion or stenosis of unspecified cerebral artery: Secondary | ICD-10-CM

## 2013-02-14 DIAGNOSIS — F0390 Unspecified dementia without behavioral disturbance: Secondary | ICD-10-CM

## 2013-02-14 NOTE — Progress Notes (Signed)
PROGRESS NOTE  DATE: 02-14-13   FACILITY: Nursing Home Location: [facility]  LEVEL OF CARE: SNF (31)  Routine Visit  CHIEF COMPLAINT:  Manage CKD, hypertension, dementia and CVA  HISTORY OF PRESENT ILLNESS:  REASSESSMENT OF ONGOING PROBLEM(S):  CHRONIC KIDNEY DISEASE: The patient's chronic kidney disease remains stable.  Staff  Deny increasing lower extremity swelling or confusion. Last BUN and creatinine are: on 12-30-12 bun 32/cr 1.19, in 9-14 bun 32/cr 1.17, in 10-14 BUN 24, creatinine 1.1  HTN: Pt 's HTN remains stable.  Staff Deny CP, sob, DOE, pedal edema, headaches, dizziness or visual disturbances.  No complications from the medications currently being used.  Last BP : 149/65,156/63, 140/62, 128/55, 131/52.  CVA: The patient's CVA remains stable. staff deny new neurologic symptoms such as numbness, tingling, weakness, speech difficulties or visual disturbances.  No complications reported from the medications currently being used.  DEMENTIA: The dementia remaines stable and continues to function adequately in the current living environment with supervision.  The patient has had little changes in behavior. No complications noted from the medications presently being used. Patient is a poor historian.  PAST MEDICAL HISTORY : Reviewed.  No changes.  CURRENT MEDICATIONS: Reviewed per [facility]  REVIEW OF SYSTEMS: Unobtainable due to dementia.  PHYSICAL EXAMINATION  VS:  T 97.6      P 62     RR 16     BP 131/52     POX %     WT (Lb) 121.2  GENERAL: no acute distress, normal body habitus NECK: supple, trachea midline, no neck masses, no thyroid tenderness, no thyromegaly RESPIRATORY: breathing is even & unlabored, BS CTAB CARDIAC: RRR, no murmur,no extra heart sounds, no edema GI: abdomen soft, normal BS, no masses, no tenderness, no hepatomegaly, no splenomegaly PSYCHIATRIC: the patient is alert & disoriented, affect & behavior  appropriate  LABS/RADIOLOGY:  01-14-13 BUN 24, creatinine 1.1 01-06-13 BUN 31, creatinine 1.31 otherwise BMP normal  9-14 glc 134, bun 32, cr 1.17, tp 5.9, alb 3 ow cmp nl, FT4 1.01, FT3 2.2  8-14 hemoglobin 10.6, MCV 86.2 otherwise CBC normal, BUN 30, glucose 126 otherwise BMP normal, TSH 0.345  5/14 hemoglobin 9.2, MCV 81.5 otherwise CBC normal  11/13 hemoglobin 8.4, MCV 84.2 otherwise CBC normal, albumin 2.4, total protein 4.8 otherwise CMP normal  ASSESSMENT/PLAN:  CKD- stable. Lasix decreased. CVA-stable. hypertension-blood pressure well controlled. dementia-stable. iron deficiency anemia-hemoglobin stable. constipation-well-controlled. allergic rhinitis-well-controlled. GERD-stable.  CPT CODE: 40981

## 2013-04-11 ENCOUNTER — Non-Acute Institutional Stay (SKILLED_NURSING_FACILITY): Payer: Medicare Other | Admitting: Internal Medicine

## 2013-04-11 DIAGNOSIS — F0390 Unspecified dementia without behavioral disturbance: Secondary | ICD-10-CM

## 2013-04-11 DIAGNOSIS — I6350 Cerebral infarction due to unspecified occlusion or stenosis of unspecified cerebral artery: Secondary | ICD-10-CM

## 2013-04-11 DIAGNOSIS — I150 Renovascular hypertension: Secondary | ICD-10-CM

## 2013-04-11 DIAGNOSIS — I639 Cerebral infarction, unspecified: Secondary | ICD-10-CM

## 2013-04-11 DIAGNOSIS — N189 Chronic kidney disease, unspecified: Secondary | ICD-10-CM

## 2013-04-11 NOTE — Progress Notes (Signed)
         PROGRESS NOTE  DATE: 04-11-13   FACILITY: Nursing Home Location: [facility]  LEVEL OF CARE: SNF (31)  Routine Visit  CHIEF COMPLAINT:  Manage CKD, hypertension, dementia and CVA  HISTORY OF PRESENT ILLNESS:  REASSESSMENT OF ONGOING PROBLEM(S):  CHRONIC KIDNEY DISEASE: The patient's chronic kidney disease remains stable.  Staff  Deny increasing lower extremity swelling or confusion. Last BUN and creatinine are: on 12-30-12 bun 32/cr 1.19, in 9-14 bun 32/cr 1.17, in 10-14 BUN 24, creatinine 1.1  HTN: Pt 's HTN remains stable.  Staff Deny CP, sob, DOE, pedal edema, headaches, dizziness or visual disturbances.  No complications from the medications currently being used.  Last BP : 149/65,156/63, 140/62, 128/55, 131/52, 152/65.  CVA: The patient's CVA remains stable. staff deny new neurologic symptoms such as numbness, tingling, weakness, speech difficulties or visual disturbances.  No complications reported from the medications currently being used.  DEMENTIA: The dementia remaines stable and continues to function adequately in the current living environment with supervision.  The patient has had little changes in behavior. No complications noted from the medications presently being used. Patient is a poor historian.  PAST MEDICAL HISTORY : Reviewed.  No changes.  CURRENT MEDICATIONS: Reviewed per [facility]  REVIEW OF SYSTEMS: Unobtainable due to dementia.  PHYSICAL EXAMINATION  VS:  T 97.3      P 63     RR 18     BP 152/65     POX %     WT (Lb) 119.8  GENERAL: no acute distress, normal body habitus EYES: Normal sclerae, normal conjunctivae, no discharge NECK: supple, trachea midline, no neck masses, no thyroid tenderness, no thyromegaly LYMPHATICS: No cervical lymphadenopathy, no supraclavicular lymphadenopathy RESPIRATORY: breathing is even & unlabored, BS CTAB CARDIAC: RRR, no murmur,no extra heart sounds, no edema GI: abdomen soft, normal BS, no  masses, no tenderness, no hepatomegaly, no splenomegaly PSYCHIATRIC: the patient is alert & disoriented, affect & behavior appropriate  LABS/RADIOLOGY:  01-14-13 BUN 24, creatinine 1.1 01-06-13 BUN 31, creatinine 1.31 otherwise BMP normal  9-14 glc 134, bun 32, cr 1.17, tp 5.9, alb 3 ow cmp nl, FT4 1.01, FT3 2.2  8-14 hemoglobin 10.6, MCV 86.2 otherwise CBC normal, BUN 30, glucose 126 otherwise BMP normal, TSH 0.345  5/14 hemoglobin 9.2, MCV 81.5 otherwise CBC normal  11/13 hemoglobin 8.4, MCV 84.2 otherwise CBC normal, albumin 2.4, total protein 4.8 otherwise CMP normal  ASSESSMENT/PLAN:  CKD- stable.  CVA-stable. Renovascular hypertension-uncontrolled. Increase lisinopril to 20 mg daily. dementia-stable. iron deficiency anemia-hemoglobin stable. constipation-well-controlled. allergic rhinitis-well-controlled. GERD-stable. Check BMP in 3 days  CPT CODE: [id number]

## 2013-04-18 ENCOUNTER — Non-Acute Institutional Stay (SKILLED_NURSING_FACILITY): Payer: Medicare Other | Admitting: Internal Medicine

## 2013-04-18 DIAGNOSIS — E876 Hypokalemia: Secondary | ICD-10-CM

## 2013-04-18 DIAGNOSIS — R4182 Altered mental status, unspecified: Secondary | ICD-10-CM

## 2013-04-19 ENCOUNTER — Encounter: Payer: Self-pay | Admitting: *Deleted

## 2013-04-22 DIAGNOSIS — E876 Hypokalemia: Secondary | ICD-10-CM | POA: Insufficient documentation

## 2013-04-22 NOTE — Progress Notes (Signed)
         PROGRESS NOTE  DATE: 04/18/2013  FACILITY:  [facility]  LEVEL OF CARE: SNF (31)  Acute Visit  CHIEF COMPLAINT:  Manage lethargy & hypokalemia  HISTORY OF PRESENT ILLNESS: I was requested by the staff to assess the patient regarding above problem(s):  LETHARGY: New problem. Staff reports that patient has been very lethargic and also had a fall. Patient is a poor historian due to dementia. Staff cannot identify precipitating or alleviating factors. There is no temporal relationship. There are no other associated signs and symptoms.  HYPOKALEMIA: New problem. On 04-14-13 potassium 3.4. In 10-14 potassium 3.9. Patient not on potassium supplementation.  PAST MEDICAL HISTORY : Reviewed.  No changes.  CURRENT MEDICATIONS: Reviewed per [facility]  REVIEW OF SYSTEMS: Unobtainable due to dementia  PHYSICAL EXAMINATION  GENERAL: no acute distress, normal body habitus EYES: conjunctivae normal, sclerae normal, normal eye lids NECK: supple, trachea midline, no neck masses, no thyroid tenderness, no thyromegaly LYMPHATICS: no LAN in the neck, no supraclavicular LAN RESPIRATORY: breathing is even & unlabored, BS CTAB CARDIAC: RRR, no murmur,no extra heart sounds, no edema GI: abdomen soft, normal BS, no masses, no tenderness, no hepatomegaly, no splenomegaly PSYCHIATRIC: the patient is alert & oriented to person, affect & behavior appropriate  LABS/RADIOLOGY: See history of present illness  ASSESSMENT/PLAN:  Altered mental status-new problem. Check UA culture and sensitivities. Hypokalemia-new problem. Give KCL 40 meq 1 time dose. Recheck potassium level. Check magnesium level.  CPT CODE: [id number]

## 2013-06-08 ENCOUNTER — Non-Acute Institutional Stay (SKILLED_NURSING_FACILITY): Payer: Medicare Other | Admitting: Internal Medicine

## 2013-06-08 DIAGNOSIS — K062 Gingival and edentulous alveolar ridge lesions associated with trauma: Secondary | ICD-10-CM

## 2013-06-08 DIAGNOSIS — K1370 Unspecified lesions of oral mucosa: Secondary | ICD-10-CM

## 2013-06-08 NOTE — Progress Notes (Signed)
Patient ID: Amy Martin, female   DOB: Dec 05, 1919, 80 y.o.   MRN: [id number]  Location:  [facility] Provider:  Rexene Edison. Mariea Clonts, D.O., C.M.D.  Code Status:  DNR  Chief Complaint  Patient presents with  . Acute Visit    right upper gum pain    HPI:  80 yo black female seen for acute visit due to not eating well.  She c/o right upper gum pain.  Her dentures were stuck in very tightly and it was hard for the CNA to remove them.  She then had some ulcerations beneath.  Review of Systems:  Review of Systems  Constitutional: Positive for weight loss.       Poor intake  HENT: Negative for sore throat.        Right upper gum pain  Respiratory: Negative for shortness of breath.   Cardiovascular: Negative for chest pain.  Gastrointestinal: Positive for constipation. Negative for heartburn.  Genitourinary: Negative for dysuria.    Medications: Patient's Medications  New Prescriptions   No medications on file  Previous Medications   ACETAMINOPHEN (TYLENOL) 325 MG TABLET    Take 650 mg by mouth every 4 (four) hours as needed. For pain   ALUM & MAG HYDROXIDE-SIMETH (MAALOX/MYLANTA) 200-200-20 MG/5ML SUSPENSION    Take 30 mLs by mouth every 4 (four) hours as needed for indigestion or heartburn. If no BM in 3 days, give 30 cc Milk of Magnesium po x 1 dose in 24 hours as needed( Do not use standing constipation orders for residents with renal failure.   ASPIRIN EC 325 MG TABLET    Take 325 mg by mouth daily.   ATORVASTATIN (LIPITOR) 40 MG TABLET    Take 40 mg by mouth daily.   CITALOPRAM (CELEXA) 20 MG TABLET    Take 10 mg by mouth every morning.    FERROUS SULFATE 325 (65 FE) MG TABLET    Take 325 mg by mouth daily with breakfast.    FUROSEMIDE (LASIX) 20 MG TABLET    Take 10 mg by mouth daily. Give 1/2 tablet by mouth daily.   HYDRALAZINE (APRESOLINE) 25 MG TABLET    Take 50 mg by mouth every 6 (six) hours.    LISINOPRIL (PRINIVIL,ZESTRIL) 20 MG TABLET    Take 20 mg by mouth daily.   METOPROLOL (LOPRESSOR) 100 MG TABLET    Take 50 mg by mouth 2 (two) times daily.    MULTIPLE VITAMIN (MULTIVITAMIN WITH MINERALS) TABS    Take 1 tablet by mouth daily.   NITROGLYCERIN (NITROSTAT) 0.4 MG SL TABLET    Place 0.4 mg under the tongue every 5 (five) minutes as needed for chest pain.   OMEPRAZOLE (PRILOSEC) 40 MG CAPSULE    Take 40 mg by mouth daily.   POLYETHYLENE GLYCOL (MIRALAX / GLYCOLAX) PACKET    Take 17 g by mouth daily.   SENNA (SENOKOT) 8.6 MG TABLET    Take 1 tablet by mouth daily. Hold for loose stools.  Modified Medications   No medications on file  Discontinued Medications   No medications on file    Physical Exam: Physical Exam  Vitals reviewed. HENT:  Head: Normocephalic and atraumatic.  Mouth/Throat: Oropharynx is clear and moist. No oropharyngeal exudate.  Has ulcerations beneath dentures on right upper gumline  Eyes: EOM are normal. Pupils are equal, round, and reactive to light.  Neck: Neck supple.  Lymphadenopathy:    She has no cervical adenopathy.  Neurological: She is alert.  Psychiatric:  She has a normal mood and affect.    Assessment/Plan 1. Denture irritation -advised staff to keep dentures out until heals and apply peridex ointment to affected sore -may need soft diet during this time, as well  Family/ staff Communication: discussed with nurse

## 2013-06-30 ENCOUNTER — Encounter: Payer: Self-pay | Admitting: Internal Medicine

## 2013-06-30 ENCOUNTER — Non-Acute Institutional Stay (SKILLED_NURSING_FACILITY): Payer: Medicare Other | Admitting: Internal Medicine

## 2013-06-30 DIAGNOSIS — I10 Essential (primary) hypertension: Secondary | ICD-10-CM

## 2013-06-30 DIAGNOSIS — I6350 Cerebral infarction due to unspecified occlusion or stenosis of unspecified cerebral artery: Secondary | ICD-10-CM

## 2013-06-30 DIAGNOSIS — I639 Cerebral infarction, unspecified: Secondary | ICD-10-CM

## 2013-06-30 DIAGNOSIS — F0390 Unspecified dementia without behavioral disturbance: Secondary | ICD-10-CM

## 2013-06-30 DIAGNOSIS — N390 Urinary tract infection, site not specified: Secondary | ICD-10-CM

## 2013-06-30 DIAGNOSIS — D509 Iron deficiency anemia, unspecified: Secondary | ICD-10-CM

## 2013-06-30 DIAGNOSIS — N189 Chronic kidney disease, unspecified: Secondary | ICD-10-CM

## 2013-06-30 NOTE — Progress Notes (Signed)
Patient ID: Amy Martin, female   DOB: 1920-03-30, 80 y.o.   MRN: [id number]   This is a routine visit.  Level of care skilled.  [facility]  Chief complaint-acute visit manage chronic medical conditions including dementia history CVA hypertension chronic kidney disease.  Acute visit secondary to UTI.  History of present illness.  Patient is a 80 year old female with the above diagnoses.  Most acute issue apparently has been some increased confusion and lethargy recently-apparently she presents this way when she has a urinary tract infection and a urine culture has grown out greater than 100,000 colonies of Escherichia coli.  Her vital signs continued to be stable she is afebrile and apparently mental status wise has returned to her baseline and does ambulate about the facility with some baseline confusion.  Do noet apparently she fell yesterday with no apparent injuries her neuro checks and vital signs remained stable  Today nursing staff states she is essentially at her baseline she has no acute complaints although she is a poor historian secondary to dementia   Regards to her other issues hypertension appears fairly well controlled recent blood pressures 130/76-138/64-129/65 she is on hydralazine as well as lisinopril and Lopressor.  Regards a CVA this is at baseline there have been no new neurologic symptoms apparently she is on aspirin for anticoagulation.  Regards to dementia she continues to be stable continues to function fairly well in this setting with supervision.  She does have a history of iron deficiency anemia she is on iron most recent hemoglobin is 9.0 this appears to be on the lower end of her recent baseline range which appears to hover from the low nines to the mid tens she is on iron once a day  He also has a history of chronic kidney disease recent creatinine was 1.1 which appears to be relatively close to her baseline.  Family medical social history  as been reviewed per history and physical on 01/12/2014.  Medications have been reviewed per MAR.  Review of systems unobtainable secondary to dementia per nursing staff she appears to have returned to her baseline mental status with confusion baseline.  Physical exam.  Temperature is 97.2 pulse 72 respirations 18 blood pressure 138/64.  In general this is a frail ill he female in no distress sitting comfortably in her wheelchair.  Her skin is warm and dry.  Eyes pupils appear reactive to light visual acuity appears grossly intact.  Oropharynx clear mucous membranes moist.  Chest is clear to auscultation no labored breathing.  Heart is regular rate and rhythm with an occasional irregular beat no significant lower extremity edema.  Abdomen soft nontender with positive bowel sounds she denies this  GU Although again she is a poor historian.  Muscle skeletal moves all extremities x4 --does have a history of left-sided weakness but this did not appear to be dramatic.  Psych she is oriented to self only does follow simple verbal commands --this pleasant according nursing staff does not appear to be more confused than normal  Labs.  Marland Kitchen 06/28/2013.  WBC 4.2 hemoglobin 9.0 platelets 195.  Sodium 142 potassium 3.8 BUN 26 creatinine 1.1.  Liver function tests within normal limits except albumin of 2.7.  Assessment and plan.  1-UTI-we have not received the sensitivities yet will start her empirically on ciprofloxacin 250 mg 3 times a day for 7 days and await culture-antibiotic results.  #2-history of dementia again she appears to be functioning fairly well in this setting apparently her mental  status is nearing baseline pleasant but confused this evening.  #3 hypertension this appears to be stable on hydralazine as well as lisinopril. And Lopressor  #4-history CVA she continues on aspirin this appears to be at baseline.  #5 chronic kidney disease-this appears to be relatively at  baseline per recent lab continue to monitor.  #6 anemia she is on iron hemoglobin appears to be somewhat variable will update a CBC next week to keep an eye on this also will order an iron panel andsguaic stools x 3 .  GERD this appears to be stable on PPI  #7-lower extremity edema-this appears to be controlled on Lasix.  [id number]

## 2013-07-21 ENCOUNTER — Non-Acute Institutional Stay (SKILLED_NURSING_FACILITY): Payer: Medicare Other | Admitting: Internal Medicine

## 2013-07-21 DIAGNOSIS — D638 Anemia in other chronic diseases classified elsewhere: Secondary | ICD-10-CM

## 2013-07-21 DIAGNOSIS — K219 Gastro-esophageal reflux disease without esophagitis: Secondary | ICD-10-CM

## 2013-07-22 DIAGNOSIS — K219 Gastro-esophageal reflux disease without esophagitis: Secondary | ICD-10-CM | POA: Insufficient documentation

## 2013-07-22 NOTE — Progress Notes (Signed)
Patient ID: Amy Martin, female   DOB: 03-Oct-1919, 80 y.o.   MRN: [id number]            PROGRESS NOTE  DATE: 07/21/2013    FACILITY:  [facility]  LEVEL OF CARE: SNF (31)  Acute Visit  CHIEF COMPLAINT:  Manage GERD and anemia.    HISTORY OF PRESENT ILLNESS: I was requested by the staff to assess the patient regarding above problem(s):     GERD:  I was requested by the pharmacy consultant to consider decreasing omeprazole to 20 mg q.d.  pt's GERD is stable.  Denies ongoing heartburn, abd. Pain, nausea or vomiting.  Currently on a PPI & tolerates it without any adverse reactions.     ANEMIA: The anemia has been stable. The patient denies fatigue, melena or hematochezia. No complications from the medications currently being used.  On 07/05/2013:  Hemoglobin 10.2, MCV 88.7.   On 06/28/2013:  Hemoglobin 9, MCV 88.5.    PAST MEDICAL HISTORY : Reviewed.  No changes/see problem list  CURRENT MEDICATIONS: Reviewed per MAR/see medication list  REVIEW OF SYSTEMS:  GENERAL: no change in appetite, no fatigue, no weight changes, no fever, chills or weakness RESPIRATORY: no cough, SOB, DOE,, wheezing, hemoptysis CARDIAC: no chest pain, edema or palpitations GI: no abdominal pain, diarrhea, constipation, heart burn, nausea or vomiting  PHYSICAL EXAMINATION  VS:  T 97.3       P 66      RR 18      BP 114/58       WT (Lb) 118.8       GENERAL: no acute distress, normal body habitus NECK: supple, trachea midline, no neck masses, no thyroid tenderness, no thyromegaly RESPIRATORY: breathing is even & unlabored, BS CTAB CARDIAC: RRR, no murmur,no extra heart sounds, no edema GI: abdomen soft, normal BS, no masses, no tenderness, no hepatomegaly, no splenomegaly PSYCHIATRIC: the patient is alert & oriented to person, affect & behavior appropriate  LABS/RADIOLOGY:   On 07/05/2013:  TIBC 224, serum iron level 54, percent saturation 24, ferritin 71.  Folic acid level  6.75.    ASSESSMENT/PLAN:    GERD.  Well controlled.  Decrease omeprazole to 20 mg q.d.    Anemia.  Hemoglobin improved.     CPT CODE: 44920     Suhail Peloquin Y March Steyer, [place] [phone]

## 2013-09-21 ENCOUNTER — Encounter: Payer: Self-pay | Admitting: Internal Medicine

## 2013-09-21 ENCOUNTER — Non-Acute Institutional Stay (SKILLED_NURSING_FACILITY): Payer: Medicare Other | Admitting: Internal Medicine

## 2013-09-21 DIAGNOSIS — D509 Iron deficiency anemia, unspecified: Secondary | ICD-10-CM

## 2013-09-21 DIAGNOSIS — I6359 Cerebral infarction due to unspecified occlusion or stenosis of other cerebral artery: Secondary | ICD-10-CM

## 2013-09-21 DIAGNOSIS — N189 Chronic kidney disease, unspecified: Secondary | ICD-10-CM

## 2013-09-21 DIAGNOSIS — I10 Essential (primary) hypertension: Secondary | ICD-10-CM

## 2013-09-21 DIAGNOSIS — I6350 Cerebral infarction due to unspecified occlusion or stenosis of unspecified cerebral artery: Secondary | ICD-10-CM

## 2013-09-21 NOTE — Progress Notes (Signed)
Patient ID: Amy Martin, female   DOB: 1920-03-26, 80 y.o.   MRN: [id number]   This is a routine visit.  Level of care skilled.  [place]   Chief complaint-acute visit manage chronic medical conditions including dementia history CVA hypertension chronic kidney disease .   History of present illness.  Patient is a 80 year old female with the above diagnoses.    hydralazine as well as lisinopril and Lopressor.  Regards a CVA this is at baseline there have been no new neurologic symptoms apparently she is on aspirin for anticoagulation.  Regards to dementia she continues to be stable continues to function fairly well in this setting with supervision-- has been started and Depakote at night for apparently mood disturbances.  She does have a history of iron deficiency anemia she is on iron most recent hemoglobin is 10.2 --she is on iron once a day  He also has a history of chronic kidney disease recent creatinine was 1.1 which appears to be relatively close to her baseline.   Family medical social history as been reviewed per history and physical on 01/12/2014.   Medications have been reviewed per MAR.   Review of systems Limited secondary to dementia.  The patient is not complaining of any shortness of breath dizziness or headache or muscle skeletal pain-she has apparently at times complained of chest pain in the past this has been chronic but she is not complaining of any chest pain tonight   .  Physical exam.   Temperature 97.0 pulse 74 respirations 18 blood pressure taken manually 962/22  variable systolics ranging from [id number] appears per review  In general this is a frail elderly female in no distress sitting comfortably in her wheelchair.  Her skin is warm and dry.  Eyes pupils appear reactive to light visual acuity appears grossly intact.  Oropharynx clear mucous membranes moist.  Chest is clear to auscultation no labored breathing.  Heart is regular rate and  rhythm with an occasional irregular beat no significant lower extremity edema.  Abdomen soft nontender with positive bowel sounds  GU COULD not really appreciate any suprapubic tenderness.  Muscle skeletal moves all extremities x4 --does have a history of left-sided weakness and is able to move her left-sided extremities. Logic as noted above  Psych she is oriented to self only does follow simple verbal commands--and can tell me her age and birth date --is pleasant according nursing staff does not appear to be more confused than normal--her son also was in the room and stated she appears to be relatively at her baseline    Labs. 07/05/2013.  WBC 4.7 hemoglobin 10.2 platelets 251.  Iron 54 total iron binding capacity [id number] 71-folic acid 1.8-HUDJSHFWYOVZ count 1.0.  B12 453.    Marland Kitchen 06/28/2013.  WBC 4.2 hemoglobin 9.0 platelets 195.  Sodium 142 potassium 3.8 BUN 26 creatinine 1.1.  Liver function tests within normal limits except albumin of 2.7.   Assessment and plan.   Marland Kitchen  #1-history of dementia again she appears to be functioning fairly well in this setting apparently her mental status is nearing baseline pleasant bu  mildlyt confused this evening--she is on Depakote at night for mood behaviors--we'll update Depakote level and liver function tests.  #2 hypertension-variable systolics although does appear to have somewhat frequent elevated systolics including this evening will increase her lisinopril to 30 mg  continues on hydralazine as well . as Lopressor --monitor blood pressure and pulse every shift with log for provider review next week #3-history  CVA she continues on aspirin this appears to be at baseline.  #4 chronic kidney disease-this appears to be relatively at baseline--we'll update BMP r.  #5 anemia she is on iron hemoglobin appears to be somewhat variable will update a CBC  to keep an eye on this --recent hemoglobin shows improvement.  GERD this appears to be stable on  PPI  #6-lower extremity edema-this appears to be controlled on Lasix .  [id number]

## 2013-09-29 ENCOUNTER — Non-Acute Institutional Stay (SKILLED_NURSING_FACILITY): Payer: Medicare Other | Admitting: Internal Medicine

## 2013-09-29 DIAGNOSIS — N189 Chronic kidney disease, unspecified: Secondary | ICD-10-CM

## 2013-09-29 DIAGNOSIS — D649 Anemia, unspecified: Secondary | ICD-10-CM

## 2013-09-29 NOTE — Progress Notes (Signed)
Patient ID: Amy Martin, female   DOB: [birth year], 80 y.o.   MRN: [id number]   Level of care skilled.  [place]  Chief complaint-acute visit followup renal insufficiency-anemia  .  History of present illness.  Patient is a 80 year old female with the above diagnoses.  hydralazine as well as lisinopril and Lopressor.  Regards a CVA this is at baseline there have been no new neurologic symptoms apparently she is on aspirin for anticoagulation.    She does have a history of iron deficiency anemia she is on iron most recent hemoglobin is 8.5-- --she is on iron once a day --her hemoglobin appears variable  recent one of 10.2 is the highest  End of baseline -baseline appears to be around 9-10 with some variability  also has a history of chronic kidney disease recent creatinine was 1.4 which appears to be a bit higher than her recent baseline of 1.1-she is on low-dose Lasix for a history apparently of edema-she is also on an ACE inhibitor which was recently increased secondary to some elevated blood pressures which do appear improved most recently 130/68 and 105/50   Family medical social history as been reviewed per history and physical on 01/12/2014 .  Medications have been reviewed per MAR .  Review of systems  Limited secondary to dementia.  The patient is not complaining of any shortness of breath dizziness or headache or muscle skeletal pain- No complaints of chest pain today--edema appears to be quite minimal  .  Physical exam.   Temperature is 98.6 pulse 63 respirations 20 blood pressure 130/68-105/50 most recently In general this is a frail elderly female in no distress sitting comfortably in her wheelchair.  Her skin is warm and dry.  .  Oropharynx clear mucous membranes moist.  Chest is clear to auscultation no labored breathing. Lower extremity edema appears quite minimal  Heart is regular rate and rhythm with an occasional irregular beat no significant lower  extremity edema.  Abdomen soft nontender with positive bowel sounds  GU COULD not really appreciate any suprapubic tenderness.  Muscle skeletal moves all extremities x4 --does have a history of left-sided weakness and is able to move her left-sided extremities.    Psych she is oriented to self only does follow simple verbal commands--and can tell me her age and birth date -   Labs  09/23/2013.  WBC 4.8 hemoglobin 8.5 platelets 192.  Sodium 141 potassium 3.9 BUN 37 creatinine 1.4  .  07/05/2013.  WBC 4.7 hemoglobin 10.2 platelets 251.  Iron 54 total iron binding capacity [id number] 71-folic acid 8.5-UDJSHFWYOVZC count 1.0.  B12 453.  Marland Kitchen 06/28/2013.  WBC 4.2 hemoglobin 9.0 platelets 195.  Sodium 142 potassium 3.8 BUN 26 creatinine 1.1.  Liver function tests within normal limits except albumin of 2. 7.  Assessment and plan  #1-renal insufficiency-creatinine has crept up a bit --will hold  Lasix for now-and update this next week--per nursing staff she is  drinking fairly well but needs to be encouraged  \2-anemia she is on iron--will order guaiac stools x3 and update this lab next week as well-some of this could be a lab variation but would like to see where we stand-suspect there is an element of chronic disease as well  [id number].  Marland Kitchen  Marland Kitchen

## 2013-10-01 ENCOUNTER — Encounter: Payer: Self-pay | Admitting: Internal Medicine

## 2013-10-24 ENCOUNTER — Encounter: Payer: Self-pay | Admitting: Internal Medicine

## 2013-10-24 ENCOUNTER — Non-Acute Institutional Stay (SKILLED_NURSING_FACILITY): Payer: Medicare Other | Admitting: Internal Medicine

## 2013-10-24 DIAGNOSIS — N189 Chronic kidney disease, unspecified: Secondary | ICD-10-CM

## 2013-10-24 DIAGNOSIS — K649 Unspecified hemorrhoids: Secondary | ICD-10-CM

## 2013-10-24 DIAGNOSIS — I10 Essential (primary) hypertension: Secondary | ICD-10-CM

## 2013-10-24 DIAGNOSIS — D649 Anemia, unspecified: Secondary | ICD-10-CM

## 2013-10-24 DIAGNOSIS — F0390 Unspecified dementia without behavioral disturbance: Secondary | ICD-10-CM

## 2013-10-24 DIAGNOSIS — K219 Gastro-esophageal reflux disease without esophagitis: Secondary | ICD-10-CM

## 2013-10-24 NOTE — Progress Notes (Signed)
Patient ID: Amy Martin, female   DOB: Dec 19, 1919, 80 y.o.   MRN: [id number]   This is a routine visit.  Level of care skilled.  [place]   Chief complaint-acute visit manage chronic medical conditions including dementia history CVA hypertension chronic kidney disease --anemia .  History of present illness.  Patient is a 80 year old female with the above diagnoses.-Per nursing staff she has been stable.  Last month I did DC her low dose Lasix secondary to rising creatinine this appears to have improved to 1.2-.  Her main complaint tonight is hemorrhoid discomfort she says this is somewhat chronic  .  Regards hx  CVA this is at baseline there have been no new neurologic symptoms apparently-- she is on aspirin for anticoagulation.  Regards to dementia she continues to be stable continues to function fairly well in this setting with supervision-- has been started on Depakote at night for apparently mood disturbances.  She does have a history of iron deficiency anemia she is on iron most recent hemoglobin is 8.5 --she is on iron once a day--previous hemoglobins have been in the 10 range although the last 2 recent ones were 8.5-an updated CBC has been ordered.  This is thought to be most likely anemia of chronic disease--she is on iron   .  Family medical social history as been reviewed per history and physical on 01/12/2014 .  Medications have been reviewed per MAR.   Review of systems  Limited secondary to dementia.  The patient is not complaining of any shortness of breath dizziness or headache or muscle skeletal pain-she has apparently at times complained of chest pain in the past this has been chronic but she is not complaining of any chest pain tonight  Her main complaint is hemorrhoid discomfort she says she has actually had surgery in the past for hemorrhoids .  Physical exam.  Temperature 97.6 pulse of 62 respirations 20 blood pressure 130/50-131/65-146/70 most  recently.     In general this is a frail elderly female in no distress  lying comfortably in bed Her skin is warm and dry.  Eyes pupils appear reactive to light visual acuity appears grossly intact.  Oropharynx clear mucous membranes moist.  Chest is clear to auscultation no labored breathing.  Heart is regular rate and rhythm with an occasional irregular beat --I note some mild ankle and pedal edema per staff that is slightly increased from baseline--it is cool to touch and nontender nonerythematous  Abdomen soft nontender with positive bowel sounds  GU could not really appreciate any suprapubic tenderness Rectal-she does have several external hemorrhoids they are not bleeding.  Muscle skeletal moves all extremities x4 --does have a history of left-sided weakness and is able to move her left-sided extremities.    Psych she is oriented to self only--however -- is pleasant and cooperative with exam appeared to give a fairly accurate history about her hemorrhoids   Labs 10/03/2013.  WBC 4.3 hemoglobin 8.5 platelets 188.  Sodium 139 potassium 4.1 BUN 35 creatinine 1.2-skin this was an improvement from 1.4 on previous lab.  05/26/2013.  Valproic acid level13.4-liver function tests within normal limits except albumin of 2.6.  07/05/2013.  Reticulocyte count 1.0-iron 54-total iron binding capacity 224.  Ferritin 71-folate 8.7-B12 453 .  07/05/2013.  WBC 4.7 hemoglobin 10.2 platelets 251.  Iron 54 total iron binding capacity [id number] 71-folic acid 7.9-GXQJJHERDEYC count 1.0.  B12 453.  Marland Kitchen 06/28/2013.  WBC 4.2 hemoglobin 9.0 platelets 195.  Sodium 142 potassium  3.8 BUN 26 creatinine 1.1.  Liver function tests within normal limits except albumin of 2.7.   Assessment and plan.  Marland Kitchen  #1-history of dementia again she appears to be functioning fairly well in this setting-she is on Depakote at night for mood behaviors-.  #2 hypertension-this appears fairly stable on  Lopressor-lisinopril-as well as hydralazine   #3-history CVA she continues on aspirin this appears to be at baseline.  #4 chronic kidney disease-this appears to be relatively at baseline--Will update metabolic panel.  #5 anemia she is on iron hemoglobin appears to be somewhat variable --baseline appears to run around 9-10 recent ones have been 8.5-will reorder guaic stools-also an updated CBC.  GERD this appears to be stable on PPI  #7-lower extremity edema-this appears to be slightly increased in her ankle- pedal areas-Will restart Lasix low dose 10 mg for 3 days-an updated basic metabolic panel tomorrow. #8 hemorrhoids-Will prescribe Anusol cream 4 times a day when necessary and monitor    .  [id number]

## 2013-10-25 LAB — BASIC METABOLIC PANEL
BUN: 26 mg/dL — AB (ref 4–21)
CREATININE: 1.1 mg/dL (ref 0.5–1.1)
Glucose: 91 mg/dL
POTASSIUM: 4 mmol/L (ref 3.4–5.3)
Sodium: 142 mmol/L (ref 137–147)

## 2013-10-25 LAB — CBC AND DIFFERENTIAL
HCT: 27 % — AB (ref 36–46)
Hemoglobin: 8.5 g/dL — AB (ref 12.0–16.0)
PLATELETS: 171 10*3/uL (ref 150–399)
WBC: 4.5 10*3/mL

## 2013-12-02 ENCOUNTER — Non-Acute Institutional Stay (SKILLED_NURSING_FACILITY): Payer: Medicare Other | Admitting: Internal Medicine

## 2013-12-02 ENCOUNTER — Encounter (HOSPITAL_COMMUNITY): Payer: Self-pay | Admitting: Emergency Medicine

## 2013-12-02 ENCOUNTER — Emergency Department (HOSPITAL_COMMUNITY): Payer: Medicare Other

## 2013-12-02 ENCOUNTER — Encounter: Payer: Self-pay | Admitting: Internal Medicine

## 2013-12-02 ENCOUNTER — Inpatient Hospital Stay (HOSPITAL_COMMUNITY)
Admission: EM | Admit: 2013-12-02 | Discharge: 2013-12-05 | DRG: 689 | Disposition: A | Discharge status: Skilled Nursing Facility | Payer: Medicare Other | Attending: Internal Medicine | Admitting: Internal Medicine

## 2013-12-02 DIAGNOSIS — Z79899 Other long term (current) drug therapy: Secondary | ICD-10-CM

## 2013-12-02 DIAGNOSIS — I69959 Hemiplegia and hemiparesis following unspecified cerebrovascular disease affecting unspecified side: Secondary | ICD-10-CM

## 2013-12-02 DIAGNOSIS — R4020 Unspecified coma: Secondary | ICD-10-CM

## 2013-12-02 DIAGNOSIS — R569 Unspecified convulsions: Secondary | ICD-10-CM

## 2013-12-02 DIAGNOSIS — B370 Candidal stomatitis: Secondary | ICD-10-CM | POA: Diagnosis present

## 2013-12-02 DIAGNOSIS — N3000 Acute cystitis without hematuria: Secondary | ICD-10-CM

## 2013-12-02 DIAGNOSIS — E43 Unspecified severe protein-calorie malnutrition: Secondary | ICD-10-CM

## 2013-12-02 DIAGNOSIS — K219 Gastro-esophageal reflux disease without esophagitis: Secondary | ICD-10-CM | POA: Diagnosis present

## 2013-12-02 DIAGNOSIS — R220 Localized swelling, mass and lump, head: Secondary | ICD-10-CM

## 2013-12-02 DIAGNOSIS — I6359 Cerebral infarction due to unspecified occlusion or stenosis of other cerebral artery: Secondary | ICD-10-CM

## 2013-12-02 DIAGNOSIS — K5900 Constipation, unspecified: Secondary | ICD-10-CM

## 2013-12-02 DIAGNOSIS — Z66 Do not resuscitate: Secondary | ICD-10-CM | POA: Diagnosis present

## 2013-12-02 DIAGNOSIS — F0390 Unspecified dementia without behavioral disturbance: Secondary | ICD-10-CM

## 2013-12-02 DIAGNOSIS — F29 Unspecified psychosis not due to a substance or known physiological condition: Secondary | ICD-10-CM | POA: Diagnosis not present

## 2013-12-02 DIAGNOSIS — F329 Major depressive disorder, single episode, unspecified: Secondary | ICD-10-CM | POA: Diagnosis present

## 2013-12-02 DIAGNOSIS — N390 Urinary tract infection, site not specified: Principal | ICD-10-CM

## 2013-12-02 DIAGNOSIS — I10 Essential (primary) hypertension: Secondary | ICD-10-CM

## 2013-12-02 DIAGNOSIS — R739 Hyperglycemia, unspecified: Secondary | ICD-10-CM

## 2013-12-02 DIAGNOSIS — I150 Renovascular hypertension: Secondary | ICD-10-CM

## 2013-12-02 DIAGNOSIS — I6350 Cerebral infarction due to unspecified occlusion or stenosis of unspecified cerebral artery: Secondary | ICD-10-CM

## 2013-12-02 DIAGNOSIS — R404 Transient alteration of awareness: Secondary | ICD-10-CM

## 2013-12-02 DIAGNOSIS — F3289 Other specified depressive episodes: Secondary | ICD-10-CM | POA: Diagnosis present

## 2013-12-02 DIAGNOSIS — G9341 Metabolic encephalopathy: Secondary | ICD-10-CM

## 2013-12-02 DIAGNOSIS — E876 Hypokalemia: Secondary | ICD-10-CM

## 2013-12-02 DIAGNOSIS — R400 Somnolence: Secondary | ICD-10-CM

## 2013-12-02 DIAGNOSIS — G40909 Epilepsy, unspecified, not intractable, without status epilepticus: Secondary | ICD-10-CM | POA: Diagnosis present

## 2013-12-02 DIAGNOSIS — IMO0002 Reserved for concepts with insufficient information to code with codable children: Secondary | ICD-10-CM

## 2013-12-02 DIAGNOSIS — Z87891 Personal history of nicotine dependence: Secondary | ICD-10-CM

## 2013-12-02 DIAGNOSIS — D72829 Elevated white blood cell count, unspecified: Secondary | ICD-10-CM | POA: Diagnosis present

## 2013-12-02 DIAGNOSIS — D509 Iron deficiency anemia, unspecified: Secondary | ICD-10-CM

## 2013-12-02 DIAGNOSIS — E119 Type 2 diabetes mellitus without complications: Secondary | ICD-10-CM | POA: Diagnosis present

## 2013-12-02 DIAGNOSIS — I129 Hypertensive chronic kidney disease with stage 1 through stage 4 chronic kidney disease, or unspecified chronic kidney disease: Secondary | ICD-10-CM | POA: Diagnosis present

## 2013-12-02 DIAGNOSIS — R40243 Glasgow coma scale score 3-8, unspecified time: Secondary | ICD-10-CM

## 2013-12-02 DIAGNOSIS — E785 Hyperlipidemia, unspecified: Secondary | ICD-10-CM | POA: Diagnosis present

## 2013-12-02 DIAGNOSIS — I6990 Unspecified sequelae of unspecified cerebrovascular disease: Secondary | ICD-10-CM

## 2013-12-02 DIAGNOSIS — N182 Chronic kidney disease, stage 2 (mild): Secondary | ICD-10-CM | POA: Diagnosis present

## 2013-12-02 DIAGNOSIS — Z515 Encounter for palliative care: Secondary | ICD-10-CM

## 2013-12-02 DIAGNOSIS — R401 Stupor: Secondary | ICD-10-CM

## 2013-12-02 DIAGNOSIS — N179 Acute kidney failure, unspecified: Secondary | ICD-10-CM

## 2013-12-02 DIAGNOSIS — R4182 Altered mental status, unspecified: Secondary | ICD-10-CM

## 2013-12-02 DIAGNOSIS — D638 Anemia in other chronic diseases classified elsewhere: Secondary | ICD-10-CM

## 2013-12-02 DIAGNOSIS — Z23 Encounter for immunization: Secondary | ICD-10-CM

## 2013-12-02 DIAGNOSIS — R5381 Other malaise: Secondary | ICD-10-CM

## 2013-12-02 DIAGNOSIS — Z7982 Long term (current) use of aspirin: Secondary | ICD-10-CM

## 2013-12-02 DIAGNOSIS — N189 Chronic kidney disease, unspecified: Secondary | ICD-10-CM

## 2013-12-02 LAB — CBC WITH DIFFERENTIAL/PLATELET
BASOS ABS: 0 10*3/uL (ref 0.0–0.1)
BASOS PCT: 1 % (ref 0–1)
EOS ABS: 0.2 10*3/uL (ref 0.0–0.7)
EOS PCT: 5 % (ref 0–5)
HCT: 34.4 % — ABNORMAL LOW (ref 36.0–46.0)
Hemoglobin: 11.1 g/dL — ABNORMAL LOW (ref 12.0–15.0)
LYMPHS ABS: 1.2 10*3/uL (ref 0.7–4.0)
Lymphocytes Relative: 29 % (ref 12–46)
MCH: 28.4 pg (ref 26.0–34.0)
MCHC: 32.3 g/dL (ref 30.0–36.0)
MCV: 88 fL (ref 78.0–100.0)
Monocytes Absolute: 0.4 10*3/uL (ref 0.1–1.0)
Monocytes Relative: 11 % (ref 3–12)
NEUTROS PCT: 54 % (ref 43–77)
Neutro Abs: 2.3 10*3/uL (ref 1.7–7.7)
PLATELETS: 222 10*3/uL (ref 150–400)
RBC: 3.91 MIL/uL (ref 3.87–5.11)
RDW: 14.3 % (ref 11.5–15.5)
WBC: 4.1 10*3/uL (ref 4.0–10.5)

## 2013-12-02 LAB — COMPREHENSIVE METABOLIC PANEL
ALBUMIN: 2.9 g/dL — AB (ref 3.5–5.2)
ALK PHOS: 73 U/L (ref 39–117)
ALT: 10 U/L (ref 0–35)
AST: 16 U/L (ref 0–37)
Anion gap: 12 (ref 5–15)
BUN: 23 mg/dL (ref 6–23)
CALCIUM: 9.4 mg/dL (ref 8.4–10.5)
CO2: 24 mEq/L (ref 19–32)
Chloride: 104 mEq/L (ref 96–112)
Creatinine, Ser: 1.01 mg/dL (ref 0.50–1.10)
GFR calc non Af Amer: 47 mL/min — ABNORMAL LOW (ref >90)
GFR, EST AFRICAN AMERICAN: 54 mL/min — AB (ref >90)
Glucose, Bld: 94 mg/dL (ref 70–99)
POTASSIUM: 4.3 meq/L (ref 3.7–5.3)
SODIUM: 140 meq/L (ref 137–147)
TOTAL PROTEIN: 6.9 g/dL (ref 6.0–8.3)
Total Bilirubin: 0.2 mg/dL — ABNORMAL LOW (ref 0.3–1.2)

## 2013-12-02 LAB — I-STAT VENOUS BLOOD GAS, ED
BICARBONATE: 26.1 meq/L — AB (ref 20.0–24.0)
O2 Saturation: 63 %
PH VEN: 7.357 — AB (ref 7.250–7.300)
PO2 VEN: 34 mmHg (ref 30.0–45.0)
TCO2: 28 mmol/L (ref 0–100)
pCO2, Ven: 46.6 mmHg (ref 45.0–50.0)

## 2013-12-02 LAB — I-STAT CG4 LACTIC ACID, ED: Lactic Acid, Venous: 0.7 mmol/L (ref 0.5–2.2)

## 2013-12-02 LAB — URINALYSIS, ROUTINE W REFLEX MICROSCOPIC
BILIRUBIN URINE: NEGATIVE
Glucose, UA: NEGATIVE mg/dL
HGB URINE DIPSTICK: NEGATIVE
Ketones, ur: 15 mg/dL — AB
Leukocytes, UA: NEGATIVE
Nitrite: NEGATIVE
PH: 7 (ref 5.0–8.0)
Protein, ur: NEGATIVE mg/dL
SPECIFIC GRAVITY, URINE: 1.012 (ref 1.005–1.030)
UROBILINOGEN UA: 0.2 mg/dL (ref 0.0–1.0)

## 2013-12-02 LAB — TROPONIN I

## 2013-12-02 LAB — PHOSPHORUS: PHOSPHORUS: 3.1 mg/dL (ref 2.3–4.6)

## 2013-12-02 LAB — CBG MONITORING, ED: Glucose-Capillary: 107 mg/dL — ABNORMAL HIGH (ref 70–99)

## 2013-12-02 LAB — VALPROIC ACID LEVEL: Valproic Acid Lvl: <10 ug/mL — ABNORMAL LOW (ref 50.0–100.0)

## 2013-12-02 LAB — MAGNESIUM: Magnesium: 2.1 mg/dL (ref 1.5–2.5)

## 2013-12-02 MED ORDER — VALPROATE SODIUM 500 MG/5ML IV SOLN
500.0000 mg | Freq: Once | INTRAVENOUS | Status: AC
  Administered 2013-12-02: 500 mg via INTRAVENOUS
  Filled 2013-12-02: qty 5

## 2013-12-02 MED ORDER — ONDANSETRON HCL 4 MG PO TABS: 4.0000 mg | ORAL_TABLET | Freq: Four times a day (QID) | ORAL | Status: DC | PRN

## 2013-12-02 MED ORDER — METOPROLOL TARTRATE 50 MG PO TABS
50.0000 mg | ORAL_TABLET | Freq: Two times a day (BID) | ORAL | Status: DC
  Administered 2013-12-02 – 2013-12-05 (×6): 50 mg via ORAL
  Filled 2013-12-02 (×7): qty 1

## 2013-12-02 MED ORDER — HALOPERIDOL LACTATE 5 MG/ML IJ SOLN: 1.0000 mg | Freq: Four times a day (QID) | INTRAMUSCULAR | Status: DC | PRN

## 2013-12-02 MED ORDER — DEXTROSE-NACL 5-0.45 % IV SOLN
INTRAVENOUS | Status: DC
  Administered 2013-12-02: 20:00:00 via INTRAVENOUS

## 2013-12-02 MED ORDER — CIPROFLOXACIN HCL 250 MG PO TABS
250.0000 mg | ORAL_TABLET | Freq: Two times a day (BID) | ORAL | Status: DC
  Filled 2013-12-02 (×2): qty 1

## 2013-12-02 MED ORDER — BENEPROTEIN PO POWD
1.0000 | Freq: Three times a day (TID) | ORAL | Status: DC
  Administered 2013-12-03 – 2013-12-05 (×7): 6 g via ORAL
  Filled 2013-12-02: qty 227

## 2013-12-02 MED ORDER — POLYETHYLENE GLYCOL 3350 17 G PO PACK
17.0000 g | PACK | Freq: Every day | ORAL | Status: DC | PRN
  Filled 2013-12-02: qty 1

## 2013-12-02 MED ORDER — POLYETHYLENE GLYCOL 3350 17 G PO PACK
17.0000 g | PACK | Freq: Every day | ORAL | Status: DC
  Administered 2013-12-02 – 2013-12-05 (×4): 17 g via ORAL
  Filled 2013-12-02 (×4): qty 1

## 2013-12-02 MED ORDER — SODIUM CHLORIDE 0.9 % IV BOLUS (SEPSIS)
1000.0000 mL | Freq: Once | INTRAVENOUS | Status: AC
  Administered 2013-12-02: 1000 mL via INTRAVENOUS

## 2013-12-02 MED ORDER — ONDANSETRON HCL 4 MG/2ML IJ SOLN
4.0000 mg | Freq: Four times a day (QID) | INTRAMUSCULAR | Status: DC | PRN
Start: 2013-12-02 — End: 2013-12-05

## 2013-12-02 MED ORDER — HYDRALAZINE HCL 50 MG PO TABS
50.0000 mg | ORAL_TABLET | Freq: Four times a day (QID) | ORAL | Status: DC
  Administered 2013-12-02 – 2013-12-05 (×12): 50 mg via ORAL
  Filled 2013-12-02 (×16): qty 1

## 2013-12-02 MED ORDER — DIVALPROEX SODIUM 125 MG PO DR TAB
125.0000 mg | DELAYED_RELEASE_TABLET | Freq: Every day | ORAL | Status: DC
  Administered 2013-12-02 – 2013-12-04 (×3): 125 mg via ORAL
  Filled 2013-12-02 (×4): qty 1

## 2013-12-02 MED ORDER — SACCHAROMYCES BOULARDII 250 MG PO CAPS
250.0000 mg | ORAL_CAPSULE | Freq: Two times a day (BID) | ORAL | Status: DC
  Administered 2013-12-02 – 2013-12-05 (×6): 250 mg via ORAL
  Filled 2013-12-02 (×7): qty 1

## 2013-12-02 MED ORDER — HEPARIN SODIUM (PORCINE) 5000 UNIT/ML IJ SOLN
5000.0000 [IU] | Freq: Three times a day (TID) | INTRAMUSCULAR | Status: DC
  Administered 2013-12-02 – 2013-12-05 (×9): 5000 [IU] via SUBCUTANEOUS
  Filled 2013-12-02 (×11): qty 1

## 2013-12-02 MED ORDER — LISINOPRIL 20 MG PO TABS
30.0000 mg | ORAL_TABLET | Freq: Every day | ORAL | Status: DC
  Administered 2013-12-03 – 2013-12-05 (×3): 30 mg via ORAL
  Filled 2013-12-02 (×3): qty 1

## 2013-12-02 MED ORDER — SULFAMETHOXAZOLE-TRIMETHOPRIM 400-80 MG/5ML IV SOLN
80.0000 mg | Freq: Two times a day (BID) | INTRAVENOUS | Status: DC
  Administered 2013-12-03: 80 mg via INTRAVENOUS
  Filled 2013-12-02 (×3): qty 5

## 2013-12-02 MED ORDER — FERROUS SULFATE 325 (65 FE) MG PO TABS
325.0000 mg | ORAL_TABLET | Freq: Every day | ORAL | Status: DC
  Administered 2013-12-03 – 2013-12-05 (×3): 325 mg via ORAL
  Filled 2013-12-02 (×4): qty 1

## 2013-12-02 MED ORDER — NITROGLYCERIN 0.4 MG SL SUBL: 0.4000 mg | SUBLINGUAL_TABLET | SUBLINGUAL | Status: DC | PRN

## 2013-12-02 MED ORDER — BARRIER CREAM NON-SPECIFIED
1.0000 "application " | TOPICAL_CREAM | Freq: Three times a day (TID) | TOPICAL | Status: DC | PRN
  Administered 2013-12-05: 1 via TOPICAL
  Filled 2013-12-02: qty 1

## 2013-12-02 MED ORDER — ACETAMINOPHEN 325 MG PO TABS: 650.0000 mg | ORAL_TABLET | ORAL | Status: DC | PRN

## 2013-12-02 MED ORDER — ASPIRIN EC 325 MG PO TBEC
325.0000 mg | DELAYED_RELEASE_TABLET | Freq: Every day | ORAL | Status: DC
  Administered 2013-12-03 – 2013-12-04 (×2): 325 mg via ORAL
  Filled 2013-12-02 (×4): qty 1

## 2013-12-02 MED ORDER — SENNOSIDES-DOCUSATE SODIUM 8.6-50 MG PO TABS
1.0000 | ORAL_TABLET | Freq: Every day | ORAL | Status: DC
  Administered 2013-12-03 – 2013-12-04 (×2): 1 via ORAL
  Filled 2013-12-02 (×2): qty 1

## 2013-12-02 NOTE — Assessment & Plan Note (Signed)
With L side hemiparesis of face and extremities as baseline

## 2013-12-02 NOTE — Progress Notes (Signed)
MRN: [id number] Name: Amy Martin  Sex: female Age: 80 y.o. DOB: [birth year]  [place] #: [facility] Facility/Room: 304 Level Of Care: SNF Provider: Inocencio Homes D Emergency Contacts: Extended Emergency Contact Information Primary Emergency Contact: Dewey,[name] Address: [address]          [place],  [address] [place] Phone: [phone] Relation: Son   Allergies: Review of patient's allergies indicates no known allergies.  Chief Complaint  Patient presents with  . Acute Visit    HPI: Patient is 80 y.o. female who nursing asked me to see for new decreased LOC. Pt was reported to have been walking herself around the facility in her [facility] yesterday. Pt is not well known to me. Reported she is hard to wake up in am but usually is awake by now. She was more awake this morning than she is now. No new meds, UTI tx in progress. No fever.  Past Medical History  Diagnosis Date  . Cancer   . Stroke   . Diabetes mellitus   . Dementia   . Hypertension   . Hyperlipidemia   . Depression   . GERD (gastroesophageal reflux disease)     History reviewed. No pertinent past surgical history.    Medication List       This list is accurate as of: 12/02/13 12:32 PM.  Always use your most recent med list.               acetaminophen 325 MG tablet  Commonly known as:  TYLENOL  Take 650 mg by mouth every 4 (four) hours as needed. For pain     alum & mag hydroxide-simeth 200-200-20 MG/5ML suspension  Commonly known as:  MAALOX/MYLANTA  Take 30 mLs by mouth every 4 (four) hours as needed for indigestion or heartburn. If no BM in 3 days, give 30 cc Milk of Magnesium po x 1 dose in 24 hours as needed( Do not use standing constipation orders for residents with renal failure.     aspirin EC 325 MG tablet  Take 325 mg by mouth daily.     atorvastatin 40 MG tablet  Commonly known as:  LIPITOR  Take 40 mg by mouth daily.     citalopram 20 MG tablet  Commonly  known as:  CELEXA  Take 10 mg by mouth every morning.     divalproex 125 MG DR tablet  Commonly known as:  DEPAKOTE  Take 125 mg by mouth at bedtime. For mood disorder     ferrous sulfate 325 (65 FE) MG tablet  Take 325 mg by mouth daily with breakfast.     hydrALAZINE 25 MG tablet  Commonly known as:  APRESOLINE  Take 50 mg by mouth every 6 (six) hours.     lisinopril 20 MG tablet  Commonly known as:  PRINIVIL,ZESTRIL  Take 30 mg by mouth daily.     metoprolol 100 MG tablet  Commonly known as:  LOPRESSOR  Take 50 mg by mouth 2 (two) times daily.     multivitamin with minerals Tabs tablet  Take 1 tablet by mouth daily.     nitroGLYCERIN 0.4 MG SL tablet  Commonly known as:  NITROSTAT  Place 0.4 mg under the tongue every 5 (five) minutes as needed for chest pain.     omeprazole 40 MG capsule  Commonly known as:  PRILOSEC  Take 40 mg by mouth daily.     polyethylene glycol packet  Commonly known as:  MIRALAX / GLYCOLAX  Take 17 g by mouth daily.     senna 8.6 MG tablet  Commonly known as:  SENOKOT  Take 1 tablet by mouth daily. Hold for loose stools.        No orders of the defined types were placed in this encounter.    Immunization History  Administered Date(s) Administered  . PPD Test 01/12/2012    History  Substance Use Topics  . Smoking status: Former Smoker    Types: Cigarettes    Quit date: 01/04/1979  . Smokeless tobacco: Not on file  . Alcohol Use: No    Review of Systems - as per nursing in HPI     Filed Vitals:   12/02/13 1137  BP: 150/79  Pulse: 58  Temp: 97.8 F (36.6 C)  Resp: 16    Physical Exam  GENERAL APPEARANCE: Able to maintain posture in the [facility] SKIN: No diaphoresis rash HEENT: Unremarkable RESPIRATORY: Breathing is even, unlabored. Lung sounds are clear   CARDIOVASCULAR: Heart RRR no murmurs, rubs or gallops. No peripheral edema  GASTROINTESTINAL: Abdomen is soft, non-tender, not distended w/ normal bowel sounds.   GENITOURINARY: Bladder non tender, not distended  MUSCULOSKELETAL: No abnormal joints or musculature NEUROLOGIC: pt's eyes opened to voice briefly, inconsistently. Opened briefly to vigourous sternal rub, then closed immediately; has + gag, responded with open eyes briefly. We moved her from the chair to the bed and she opened her eyes for about 15 sec then closed again; from what I can tell from nurses initially they thought she was weaker on L side than usual but now that we have moved her to bed they feel like L side weakness is unchanged. PSYCHIATRIC: pt has tendency to be feisty with handling so her behavoir now is especially unusual  Patient Active Problem List   Diagnosis Date Noted  . Esophageal reflux 07/22/2013  . UTI (urinary tract infection) 06/30/2013  . Hypopotassemia 04/22/2013  . Chronic kidney disease, unspecified 01/14/2013  . Secondary renovascular hypertension, benign 01/14/2013  . Altered mental status 12/14/2012  . Hyperglycemia 12/14/2012  . Anemia of other chronic disease 11/02/2012  . Hemorrhoid 11/02/2012  . Unspecified late effects of cerebrovascular disease 08/13/2012  . Essential hypertension, benign 08/13/2012  . Senile dementia, uncomplicated 50/35/4656  . Iron deficiency anemia, unspecified 08/13/2012  . Anemia 01/06/2012  . Lip swelling 01/06/2012  . AKI (acute kidney injury) 01/06/2012  . HTN (hypertension) 01/06/2012  . CVA (cerebral infarction) 01/04/2012    CBC    Component Value Date/Time   WBC 4.5 10/25/2013   WBC 5.4 01/10/2012 0630   RBC 2.63* 01/10/2012 0630   HGB 8.5* 10/25/2013   HCT 27* 10/25/2013   PLT 171 10/25/2013   MCV 84.4 01/10/2012 0630    CMP     Component Value Date/Time   NA 142 10/25/2013   NA 142 01/10/2012 0630   K 4.0 10/25/2013   CL 112 01/10/2012 0630   CO2 21 01/10/2012 0630   GLUCOSE 79 01/10/2012 0630   BUN 26* 10/25/2013   BUN 9 01/10/2012 0630   CREATININE 1.1 10/25/2013   CREATININE 1.08 01/10/2012 0630    CALCIUM 9.0 01/10/2012 0630   PROT 5.6* 01/05/2012 0442   ALBUMIN 2.6* 01/05/2012 0442   AST 14 01/05/2012 0442   ALT 7 01/05/2012 0442   ALKPHOS 82 01/05/2012 0442   BILITOT 0.4 01/05/2012 0442   GFRNONAA 44* 01/10/2012 0630   GFRAA 51* 01/10/2012 0630    Assessment and  Plan  SUDDEN DECREASE IN MENTAL STATUS - changed from this am and from yesterday;no new meds or tx,no hx for seizure; infection being txed and pt was improving, not, diabetic; send to ED for eval  UTI (urinary tract infection) Pt is currently being treated for a UTI, started Cipro on 8/31  CVA (cerebral infarction) 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

## 2013-12-02 NOTE — Progress Notes (Signed)
Pt admitted to the unit at [facility]. Pt mental status is A&Ox1. Pt oriented to room, staff, and call bell. Skin is intact. Full assessment charted in [facility]. Call bell within reach. Visitor guidelines reviewed w/ pt and/or family.

## 2013-12-02 NOTE — ED Notes (Signed)
MD at bedside. 

## 2013-12-02 NOTE — Progress Notes (Signed)
Received report on patient. Awaiting patient arrival.

## 2013-12-02 NOTE — Consult Note (Signed)
Reason for Consult: Altered mental status.  HPI:                                                                                                                                          Amy Martin is an 81 y.o. female with a history of diabetes mellitus, stroke, dementia, hypertension and hyperlipidemia , brought to the emergency room for .evaluation of altered mental status. She was noted to be encephalopathic with reduced responsiveness. There was also concern that she might have experienced a generalized seizure and was postictal. Patient is currently being treated with Cipro for urinary tract infection. There's no clinical history of seizure disorder. Patient has been on Depakote 125 mg at bedtime for labral abnormalities. CT scan of her head showed patchy hypoattenuation in the region of the right basal ganglia thought to likely be chronic. Electrolytes were unremarkable. Blood glucose was 94. Urinalysis was positive for ketones but otherwise unremarkable. Mental status has improved since admission, as patient is more alert but still confused.   Past Medical History  Diagnosis Date  . Cancer   . Stroke   . Diabetes mellitus   . Dementia   . Hypertension   . Hyperlipidemia   . Depression   . GERD (gastroesophageal reflux disease)     History reviewed. No pertinent past surgical history.  No family history on file.  Social History:  reports that she quit smoking about 34 years ago. Her smoking use included Cigarettes. She smoked 0.00 packs per day. She does not have any smokeless tobacco history on file. She reports that she does not drink alcohol. Her drug history is not on file.  No Known Allergies  MEDICATIONS:                                                                                                                     I have reviewed the patient's current medications.   ROS:  History obtained from unobtainable from patient due to mental status   Blood pressure 206/81, pulse 64, temperature 97.9 F (36.6 C), temperature source Axillary, resp. rate 14, height 5' 4"  (1.626 m), weight 55 kg (121 lb 4.1 oz), SpO2 100.00%.   Neurologic Examination:                                                                                                      Mental Status: Alert, confused, agitated and combative.  Speech moderately slurred without evidence of aphasia. Patient refused to follow commands Cranial Nerves: II-Visual fields were normal. III/IV/VI-Pupils were equal and reacted. Extraocular movements were full and conjugate.    V/VII-moderate left lower facial weakness noted. VIII-normal. Anselmo Pickler is moderately slurred. Motor: Strength of extremities was normal and symmetrical throughout Sensory: Unable to assess Deep Tendon Reflexes: Unable to assess Plantars: Unable to assess Cerebellar: Unable to assess  Lab Results  Component Value Date/Time   CHOL 149 01/04/2012  7:05 AM    Results for orders placed during the hospital encounter of 12/02/13 (from the past 48 hour(s))  CBG MONITORING, ED     Status: Abnormal   Collection Time    12/02/13 12:55 PM      Result Value Ref Range   Glucose-Capillary 107 (*) 70 - 99 mg/dL  CBC WITH DIFFERENTIAL     Status: Abnormal   Collection Time    12/02/13  1:00 PM      Result Value Ref Range   WBC 4.1  4.0 - 10.5 K/uL   RBC 3.91  3.87 - 5.11 MIL/uL   Hemoglobin 11.1 (*) 12.0 - 15.0 g/dL   HCT 34.4 (*) 36.0 - 46.0 %   MCV 88.0  78.0 - 100.0 fL   MCH 28.4  26.0 - 34.0 pg   MCHC 32.3  30.0 - 36.0 g/dL   RDW 14.3  11.5 - 15.5 %   Platelets 222  150 - 400 K/uL   Neutrophils Relative % 54  43 - 77 %   Neutro Abs 2.3  1.7 - 7.7 K/uL   Lymphocytes Relative 29  12 - 46 %   Lymphs Abs 1.2  0.7 - 4.0 K/uL   Monocytes Relative 11  3 - 12 %   Monocytes Absolute 0.4  0.1 - 1.0 K/uL    Eosinophils Relative 5  0 - 5 %   Eosinophils Absolute 0.2  0.0 - 0.7 K/uL   Basophils Relative 1  0 - 1 %   Basophils Absolute 0.0  0.0 - 0.1 K/uL  COMPREHENSIVE METABOLIC PANEL     Status: Abnormal   Collection Time    12/02/13  1:00 PM      Result Value Ref Range   Sodium 140  137 - 147 mEq/L   Potassium 4.3  3.7 - 5.3 mEq/L   Chloride 104  96 - 112 mEq/L   CO2 24  19 - 32 mEq/L   Glucose, Bld 94  70 - 99 mg/dL   BUN 23  6 - 23 mg/dL   Creatinine, Ser 1.01  0.50 - 1.10 mg/dL   Calcium 9.4  8.4 - 10.5 mg/dL   Total Protein 6.9  6.0 - 8.3 g/dL   Albumin 2.9 (*) 3.5 - 5.2 g/dL   AST 16  0 - 37 U/L   ALT 10  0 - 35 U/L   Alkaline Phosphatase 73  39 - 117 U/L   Total Bilirubin 0.2 (*) 0.3 - 1.2 mg/dL   GFR calc non Af Amer 47 (*) >90 mL/min   GFR calc Af Amer 54 (*) >90 mL/min   Comment: (NOTE)     The eGFR has been calculated using the CKD EPI equation.     This calculation has not been validated in all clinical situations.     eGFR's persistently <90 mL/min signify possible Chronic Kidney     Disease.   Anion gap 12  5 - 15  TROPONIN I     Status: None   Collection Time    12/02/13  1:00 PM      Result Value Ref Range   Troponin I <0.30  <0.30 ng/mL   Comment:            Due to the release kinetics of cTnI,     a negative result within the first hours     of the onset of symptoms does not rule out     myocardial infarction with certainty.     If myocardial infarction is still suspected,     repeat the test at appropriate intervals.  MAGNESIUM     Status: None   Collection Time    12/02/13  1:00 PM      Result Value Ref Range   Magnesium 2.1  1.5 - 2.5 mg/dL  PHOSPHORUS     Status: None   Collection Time    12/02/13  1:00 PM      Result Value Ref Range   Phosphorus 3.1  2.3 - 4.6 mg/dL  I-STAT CG4 LACTIC ACID, ED     Status: None   Collection Time    12/02/13  2:00 PM      Result Value Ref Range   Lactic Acid, Venous 0.70  0.5 - 2.2 mmol/L  I-STAT VENOUS BLOOD  GAS, ED     Status: Abnormal   Collection Time    12/02/13  3:43 PM      Result Value Ref Range   pH, Ven 7.357 (*) 7.250 - 7.300   pCO2, Ven 46.6  45.0 - 50.0 mmHg   pO2, Ven 34.0  30.0 - 45.0 mmHg   Bicarbonate 26.1 (*) 20.0 - 24.0 mEq/L   TCO2 28  0 - 100 mmol/L   O2 Saturation 63.0     Collection site BRACHIAL ARTERY     Sample type VENOUS     Comment NOTIFIED PHYSICIAN    URINALYSIS, ROUTINE W REFLEX MICROSCOPIC     Status: Abnormal   Collection Time    12/02/13  3:44 PM      Result Value Ref Range   Color, Urine YELLOW  YELLOW   APPearance CLEAR  CLEAR   Specific Gravity, Urine 1.012  1.005 - 1.030   pH 7.0  5.0 - 8.0   Glucose, UA NEGATIVE  NEGATIVE mg/dL   Hgb urine dipstick NEGATIVE  NEGATIVE   Bilirubin Urine NEGATIVE  NEGATIVE   Ketones, ur 15 (*) NEGATIVE mg/dL   Protein, ur NEGATIVE  NEGATIVE mg/dL   Urobilinogen, UA 0.2  0.0 - 1.0 mg/dL   Nitrite  NEGATIVE  NEGATIVE   Leukocytes, UA NEGATIVE  NEGATIVE   Comment: MICROSCOPIC NOT DONE ON URINES WITH NEGATIVE PROTEIN, BLOOD, LEUKOCYTES, NITRITE, OR GLUCOSE <1000 mg/dL.  VALPROIC ACID LEVEL     Status: Abnormal   Collection Time    12/02/13  4:34 PM      Result Value Ref Range   Valproic Acid Lvl <10.0 (*) 50.0 - 100.0 ug/mL    Ct Head Wo Contrast  12/02/2013   CLINICAL DATA:  Altered mental status  EXAM: CT HEAD WITHOUT CONTRAST  TECHNIQUE: Contiguous axial images were obtained from the base of the skull through the vertex without intravenous contrast.  COMPARISON:  Brain CT 01/10/2012  FINDINGS: Extensive periventricular and subcortical white matter hypodensity compatible with chronic small vessel ischemic change. More extensive high right frontal and parietal lobe hypodensity is favored to represent interval evolution right MCA infarct and chronic small vessel ischemic change. Patchy hypoattenuation within the right basal ganglia. No definite evidence for acute cortically based infarct, mass lesion, mass effect or  hemorrhage. Bilateral cataract surgery. Paranasal sinuses are unremarkable. Mastoid air cells unremarkable. Calvarium intact.  IMPRESSION: Patchy hypoattenuation within the region of the right basal ganglia may represent age indeterminate infarct, favored to be chronic.  Extensive chronic small vessel ischemic change. Increased white matter hypodensity within the high right frontal and parietal lobes is favored to represent worsening chronic small vessel ischemic change.   Electronically Signed   By: Lovey Newcomer M.D.   On: 12/02/2013 14:19   Dg Chest Port 1 View  12/02/2013   CLINICAL DATA:  Altered level of consciousness.  EXAM: PORTABLE CHEST - 1 VIEW  COMPARISON:  01/07/2012  FINDINGS: The heart size and mediastinal contours are within normal limits. Lung volumes are low. There is no evidence of pulmonary edema, consolidation, pneumothorax, nodule or pleural fluid. The visualized skeletal structures are unremarkable.  IMPRESSION: No active disease.   Electronically Signed   By: Aletta Edouard M.D.   On: 12/02/2013 13:42   Assessment/Plan:  80 year old lady with a history of dementia, presenting with altered mental status with reduced responsiveness of unclear etiology. Patient has no history of seizure disorder and was on Depakote for behavioral changes. Generalized seizure followed by postictal state cannot be ruled out, particularly with patient being currently treated with Cipro for urinary tract infection.  Recommendations: 1. We'll switch patient from Cipro to Bactrim for urinary tract infection management. 2. EEG, routine adult study. 3. No change in current dose of Depakote 4. MRI of the brain to rule out acute stroke  We will continue to follow this patient with you.   C.R. Nicole Kindred, MD [facility] [phone]  12/02/2013, 9:50 PM

## 2013-12-02 NOTE — ED Notes (Signed)
To ED from [facility] via [place], ?[facility] for several days, unclear report from nursing home, pt with old stroke deficits, baseline unclear, minimally responsive to verbal stimuli, VSS, CBG 144

## 2013-12-02 NOTE — ED Provider Notes (Signed)
CSN: [id number]     Arrival date & time 12/02/13  1238 History   First MD Initiated Contact with Patient 12/02/13 1258     Chief Complaint  Patient presents with  . Altered Mental Status     (Consider location/radiation/quality/duration/timing/severity/associated sxs/prior Treatment) HPI Comments: LEVEL 5 CAVEAT FOR ALTERED MENTAL STATUS  Pt comes in with cc of AMS from rehab. Nursing home unable to give any hx. Per daughter, pt is at baseline quite interactive and active. Pt was last normal a week ago per her. She saw her yday, and patient was appearing more confused. There is hx of stroke with left sided weakness, DM, dementia, CKD. Pt has no falls that they are aware of. Hx of UTI. No CAD hx.  Patient is a 80 y.o. female presenting with altered mental status. The history is provided by medical records and a relative.  Altered Mental Status   Past Medical History  Diagnosis Date  . Cancer   . Stroke   . Diabetes mellitus   . Dementia   . Hypertension   . Hyperlipidemia   . Depression   . GERD (gastroesophageal reflux disease)    History reviewed. No pertinent past surgical history. No family history on file. History  Substance Use Topics  . Smoking status: Former Smoker    Types: Cigarettes    Quit date: 01/04/1979  . Smokeless tobacco: Not on file  . Alcohol Use: No   OB History   Grav Para Term Preterm Abortions TAB SAB Ect Mult Living                 Review of Systems  Unable to perform ROS: Mental status change      Allergies  Review of patient's allergies indicates no known allergies.  Home Medications   Prior to Admission medications   Medication Sig Start Date End Date Taking? Authorizing Provider  acetaminophen (TYLENOL) 325 MG tablet Take 650 mg by mouth every 4 (four) hours as needed. For pain   Yes Historical Provider, MD  aspirin EC 325 MG tablet Take 325 mg by mouth daily.   Yes Historical Provider, MD  barrier cream (NON-SPECIFIED) CREA Apply  1 application topically 3 (three) times daily as needed (once a shift and after each incontinenet episode).   Yes Historical Provider, MD  ciprofloxacin (CIPRO) 250 MG tablet Take 250 mg by mouth 2 (two) times daily. For 7 days starting on 11/28/13   Yes Historical Provider, MD  divalproex (DEPAKOTE) 125 MG DR tablet Take 125 mg by mouth at bedtime. For mood disorder   Yes Historical Provider, MD  ferrous sulfate 325 (65 FE) MG tablet Take 325 mg by mouth daily with breakfast.    Yes Historical Provider, MD  hydrALAZINE (APRESOLINE) 50 MG tablet Take 50 mg by mouth every 6 (six) hours.   Yes Historical Provider, MD  lisinopril (PRINIVIL,ZESTRIL) 30 MG tablet Take 30 mg by mouth daily.   Yes Historical Provider, MD  metoprolol (LOPRESSOR) 50 MG tablet Take 50 mg by mouth 2 (two) times daily.   Yes Historical Provider, MD  Multiple Vitamin (MULTIVITAMIN WITH MINERALS) TABS Take 1 tablet by mouth daily.   Yes Historical Provider, MD  nitroGLYCERIN (NITROSTAT) 0.4 MG SL tablet Place 0.4 mg under the tongue every 5 (five) minutes as needed for chest pain.   Yes Historical Provider, MD  omeprazole (PRILOSEC) 20 MG capsule Take 20 mg by mouth daily before supper.   Yes Historical Provider, MD  polyethylene  glycol (MIRALAX / GLYCOLAX) packet Take 17 g by mouth daily.   Yes Historical Provider, MD  protein supplement (RESOURCE BENEPROTEIN) POWD Take 1 scoop by mouth 3 (three) times daily with meals. In pudding   Yes Historical Provider, MD  saccharomyces boulardii (FLORASTOR) 250 MG capsule Take 250 mg by mouth 2 (two) times daily. For 7 days starting on 11/28/13   Yes Historical Provider, MD  senna-docusate (SENOKOT-S) 8.6-50 MG per tablet Take 1 tablet by mouth daily. Hold for loose stools   Yes Historical Provider, MD   BP 167/66  Pulse 64  Temp(Src) 98.5 F (36.9 C) (Rectal)  Resp 12  SpO2 99% Physical Exam  Nursing note and vitals reviewed. Constitutional: She appears well-developed.  HENT:  Head:  Normocephalic and atraumatic.  Eyes: EOM are normal. Pupils are equal, round, and reactive to light.  Neck: Neck supple.  Cardiovascular: Normal rate and regular rhythm.   Murmur heard. Pulmonary/Chest: Effort normal. No respiratory distress.  Abdominal: Soft. Bowel sounds are normal. She exhibits no distension. There is no tenderness. There is no rebound and no guarding.  Musculoskeletal: She exhibits no edema.  Neurological:  GCS-  E/v/m = 1/2/4 = 7  Skin: Skin is warm and dry.    ED Course  Procedures (including critical care time) Labs Review Labs Reviewed  CBC WITH DIFFERENTIAL - Abnormal; Notable for the following:    Hemoglobin 11.1 (*)    HCT 34.4 (*)    All other components within normal limits  COMPREHENSIVE METABOLIC PANEL - Abnormal; Notable for the following:    Albumin 2.9 (*)    Total Bilirubin 0.2 (*)    GFR calc non Af Amer 47 (*)    GFR calc Af Amer 54 (*)    All other components within normal limits  URINALYSIS, ROUTINE W REFLEX MICROSCOPIC - Abnormal; Notable for the following:    Ketones, ur 15 (*)    All other components within normal limits  I-STAT VENOUS BLOOD GAS, ED - Abnormal; Notable for the following:    pH, Ven 7.357 (*)    Bicarbonate 26.1 (*)    All other components within normal limits  URINE CULTURE  TROPONIN I  MAGNESIUM  PHOSPHORUS  I-STAT CG4 LACTIC ACID, ED    Imaging Review Ct Head Wo Contrast  12/02/2013   CLINICAL DATA:  Altered mental status  EXAM: CT HEAD WITHOUT CONTRAST  TECHNIQUE: Contiguous axial images were obtained from the base of the skull through the vertex without intravenous contrast.  COMPARISON:  Brain CT 01/10/2012  FINDINGS: Extensive periventricular and subcortical white matter hypodensity compatible with chronic small vessel ischemic change. More extensive high right frontal and parietal lobe hypodensity is favored to represent interval evolution right MCA infarct and chronic small vessel ischemic change. Patchy  hypoattenuation within the right basal ganglia. No definite evidence for acute cortically based infarct, mass lesion, mass effect or hemorrhage. Bilateral cataract surgery. Paranasal sinuses are unremarkable. Mastoid air cells unremarkable. Calvarium intact.  IMPRESSION: Patchy hypoattenuation within the region of the right basal ganglia may represent age indeterminate infarct, favored to be chronic.  Extensive chronic small vessel ischemic change. Increased white matter hypodensity within the high right frontal and parietal lobes is favored to represent worsening chronic small vessel ischemic change.   Electronically Signed   By: Lovey Newcomer M.D.   On: 12/02/2013 14:19   Dg Chest Port 1 View  12/02/2013   CLINICAL DATA:  Altered level of consciousness.  EXAM: PORTABLE CHEST -  1 VIEW  COMPARISON:  01/07/2012  FINDINGS: The heart size and mediastinal contours are within normal limits. Lung volumes are low. There is no evidence of pulmonary edema, consolidation, pneumothorax, nodule or pleural fluid. The visualized skeletal structures are unremarkable.  IMPRESSION: No active disease.   Electronically Signed   By: Aletta Edouard M.D.   On: 12/02/2013 13:42     EKG Interpretation None      MDM   Final diagnoses:  Glasgow coma scale total score 3-8  Altered mental status  DDx: Sepsis syndrome SEIZURE ACS syndrome DKA ICH/Stroke Infection - pneumonia/UTI/Cellulitis PE Dehydration Electrolyte abnormality Tox syndrome Drug overdose Metabolic disorders including thyroid disorders, adrenal insufficiency Acute anemia Cancer of unknown origin Hypercapnia COPD Hypoxia   Pt comes in with cc of AMS from nursing home. PT has GCS of 7 at arrival, + protecting airway. Per daughter, slow decline over the last week, but more pronounced decline overnight. CT head shows no falls. Labs are all WNL.  Pt just reassessed. Much improved, more alert.  I am unsure what caused her sx. She did receive  narcan by EMS. So i dont think it is narcotics.  Other possibility is Seizures. Neuro will be consulted, Hospitalist consulted for admission, as patient, though improved, is still not baseline. Stroke/TIA vs. Seizures.  Varney Biles, MD 12/02/13 [phone]

## 2013-12-02 NOTE — Assessment & Plan Note (Signed)
Pt is currently being treated for a UTI, started Cipro on 8/31

## 2013-12-02 NOTE — H&P (Addendum)
[facility] History and Physical  Gaelle Adriance EGB:[id number] DOB: 09-26-1919 DOA: 12/02/2013  Referring physician: Dr. Kathrynn Humble PCP: Ezequiel Kayser, MD   Chief Complaint: AMS  HPI: Amy Martin is a 80 y.o. female  With past medical history of seizure and advanced dementia, CVA, diabetes and chronic renal disease that comes in for alternative status. Also the history was obtained from the chart as the patient is incoherent and no family members are at bedside. As per record she was at her baseline quite interactive a week ago. She saw her the day of admission and the patient appeared to be more confused.  In the ED: She continues to be confused and unresponsive her basic metabolic panel and CBC are within normal her Depakote level is less than 10. CT scan of the head showed no bleed. Glasgow Coma Scale 7.  Review of Systems:  Constitutional:  No weight loss, night sweats, Fevers, chills, fatigue.  HEENT:  No headaches, Difficulty swallowing,Tooth/dental problems,Sore throat,  No sneezing, itching, ear ache, nasal congestion, post nasal drip,  Cardio-vascular:  No chest pain, Orthopnea, PND, swelling in lower extremities, anasarca, dizziness, palpitations  GI:  No heartburn, indigestion, abdominal pain, nausea, vomiting, diarrhea, change in bowel habits, loss of appetite  Resp:  No shortness of breath with exertion or at rest. No excess mucus, no productive cough, No non-productive cough, No coughing up of blood.No change in color of mucus.No wheezing.No chest wall deformity  Skin:  no rash or lesions.  GU:  no dysuria, change in color of urine, no urgency or frequency. No flank pain.  Musculoskeletal:  No joint pain or swelling. No decreased range of motion. No back pain.   Past Medical History  Diagnosis Date  . Cancer   . Stroke   . Diabetes mellitus   . Dementia   . Hypertension   . Hyperlipidemia   . Depression   . GERD (gastroesophageal reflux disease)      History reviewed. No pertinent past surgical history. Social History:  reports that she quit smoking about 34 years ago. Her smoking use included Cigarettes. She smoked 0.00 packs per day. She does not have any smokeless tobacco history on file. She reports that she does not drink alcohol. Her drug history is not on file.  No Known Allergies  No family history on file.   Prior to Admission medications   Medication Sig Start Date End Date Taking? Authorizing Provider  acetaminophen (TYLENOL) 325 MG tablet Take 650 mg by mouth every 4 (four) hours as needed. For pain   Yes Historical Provider, MD  aspirin EC 325 MG tablet Take 325 mg by mouth daily.   Yes Historical Provider, MD  barrier cream (NON-SPECIFIED) CREA Apply 1 application topically 3 (three) times daily as needed (once a shift and after each incontinenet episode).   Yes Historical Provider, MD  ciprofloxacin (CIPRO) 250 MG tablet Take 250 mg by mouth 2 (two) times daily. For 7 days starting on 11/28/13   Yes Historical Provider, MD  divalproex (DEPAKOTE) 125 MG DR tablet Take 125 mg by mouth at bedtime. For mood disorder   Yes Historical Provider, MD  ferrous sulfate 325 (65 FE) MG tablet Take 325 mg by mouth daily with breakfast.    Yes Historical Provider, MD  hydrALAZINE (APRESOLINE) 50 MG tablet Take 50 mg by mouth every 6 (six) hours.   Yes Historical Provider, MD  lisinopril (PRINIVIL,ZESTRIL) 30 MG tablet Take 30 mg by mouth daily.   Yes Historical Provider,  MD  metoprolol (LOPRESSOR) 50 MG tablet Take 50 mg by mouth 2 (two) times daily.   Yes Historical Provider, MD  Multiple Vitamin (MULTIVITAMIN WITH MINERALS) TABS Take 1 tablet by mouth daily.   Yes Historical Provider, MD  nitroGLYCERIN (NITROSTAT) 0.4 MG SL tablet Place 0.4 mg under the tongue every 5 (five) minutes as needed for chest pain.   Yes Historical Provider, MD  omeprazole (PRILOSEC) 20 MG capsule Take 20 mg by mouth daily before supper.   Yes Historical  Provider, MD  polyethylene glycol (MIRALAX / GLYCOLAX) packet Take 17 g by mouth daily.   Yes Historical Provider, MD  protein supplement (RESOURCE BENEPROTEIN) POWD Take 1 scoop by mouth 3 (three) times daily with meals. In pudding   Yes Historical Provider, MD  saccharomyces boulardii (FLORASTOR) 250 MG capsule Take 250 mg by mouth 2 (two) times daily. For 7 days starting on 11/28/13   Yes Historical Provider, MD  senna-docusate (SENOKOT-S) 8.6-50 MG per tablet Take 1 tablet by mouth daily. Hold for loose stools   Yes Historical Provider, MD   Physical Exam: Filed Vitals:   12/02/13 1535 12/02/13 1600 12/02/13 1700 12/02/13 1743  BP:  160/70 195/64   Pulse:  56 56   Temp: 97.9 F (36.6 C)   98.1 F (36.7 C)  TempSrc: Oral   Oral  Resp:  14 10   SpO2:  100% 100%     Wt Readings from Last 3 Encounters:  12/02/13 55.157 kg (121 lb 9.6 oz)  10/13/12 53.071 kg (117 lb)  01/12/12 56.8 kg (125 lb 3.5 oz)    General:  Appears calm and comfortable Eyes: PERRL, normal lids, irises & conjunctiva ENT: Mucosa membranes are dry Neck: no LAD, masses or thyromegaly Cardiovascular: RRR, no m/r/g. No LE edema. Telemetry: SR, no arrhythmias  Respiratory: CTA bilaterally, no w/r/r. Normal respiratory effort. Abdomen: soft, ntnd Skin: no rash or induration seen on limited exam Musculoskeletal: grossly normal tone BUE/BLE Neurologic: To remove all 4 studies without any difficulties.           Labs on Admission:  Basic Metabolic Panel:  Recent Labs Lab 12/02/13 1300  NA 140  K 4.3  CL 104  CO2 24  GLUCOSE 94  BUN 23  CREATININE 1.01  CALCIUM 9.4  MG 2.1  PHOS 3.1   Liver Function Tests:  Recent Labs Lab 12/02/13 1300  AST 16  ALT 10  ALKPHOS 73  BILITOT 0.2*  PROT 6.9  ALBUMIN 2.9*   No results found for this basename: LIPASE, AMYLASE,  in the last 168 hours No results found for this basename: AMMONIA,  in the last 168 hours CBC:  Recent Labs Lab 12/02/13 1300    WBC 4.1  NEUTROABS 2.3  HGB 11.1*  HCT 34.4*  MCV 88.0  PLT 222   Cardiac Enzymes:  Recent Labs Lab 12/02/13 1300  TROPONINI <0.30    BNP (last 3 results) No results found for this basename: PROBNP,  in the last 8760 hours CBG: No results found for this basename: GLUCAP,  in the last 168 hours  Radiological Exams on Admission: Ct Head Wo Contrast  12/02/2013   CLINICAL DATA:  Altered mental status  EXAM: CT HEAD WITHOUT CONTRAST  TECHNIQUE: Contiguous axial images were obtained from the base of the skull through the vertex without intravenous contrast.  COMPARISON:  Brain CT 01/10/2012  FINDINGS: Extensive periventricular and subcortical white matter hypodensity compatible with chronic small vessel ischemic change. More extensive  high right frontal and parietal lobe hypodensity is favored to represent interval evolution right MCA infarct and chronic small vessel ischemic change. Patchy hypoattenuation within the right basal ganglia. No definite evidence for acute cortically based infarct, mass lesion, mass effect or hemorrhage. Bilateral cataract surgery. Paranasal sinuses are unremarkable. Mastoid air cells unremarkable. Calvarium intact.  IMPRESSION: Patchy hypoattenuation within the region of the right basal ganglia may represent age indeterminate infarct, favored to be chronic.  Extensive chronic small vessel ischemic change. Increased white matter hypodensity within the high right frontal and parietal lobes is favored to represent worsening chronic small vessel ischemic change.   Electronically Signed   By: Lovey Newcomer M.D.   On: 12/02/2013 14:19   Dg Chest Port 1 View  12/02/2013   CLINICAL DATA:  Altered level of consciousness.  EXAM: PORTABLE CHEST - 1 VIEW  COMPARISON:  01/07/2012  FINDINGS: The heart size and mediastinal contours are within normal limits. Lung volumes are low. There is no evidence of pulmonary edema, consolidation, pneumothorax, nodule or pleural fluid. The  visualized skeletal structures are unremarkable.  IMPRESSION: No active disease.   Electronically Signed   By: Aletta Edouard M.D.   On: 12/02/2013 13:42    EKG: Independently reviewed. none  Assessment/Plan Possible Seizure/ Metabolic encephalopathy with  Underlying Senile dementia, uncomplicated: - Neurology has already been consulted by the ED, they planned to do an EEG. - Load her with IV Depakote her Depakote, level was less than 10. - UA does not appear to show signs of infection, and she is on ciprofloxacin for possible UTI. UA and urine culture will be unreliable. - We'll have to discuss goals of care with family. - She has a mild leukocytosis which canbe due to stress de margination. Afebrile, sat well. - Use haldol.  HTN (hypertension) - Resume home meds in the morning.  Chronic renal disease stage II: - Creatinine baseline.  Anemia of chronic disease: - Hbg at baseline.   Code Status: Only DNR DVT Prophylaxis:heparin Family Communication: none Disposition Plan: observation  Time spent: 65 minutes  Charlynne Cousins [facility] Pager [phone]  **Disclaimer: This note may have been dictated with voice recognition software. Similar sounding words can inadvertently be transcribed and this note may contain transcription errors which may not have been corrected upon publication of note.**

## 2013-12-03 ENCOUNTER — Inpatient Hospital Stay (HOSPITAL_COMMUNITY): Payer: Medicare Other

## 2013-12-03 DIAGNOSIS — R5381 Other malaise: Secondary | ICD-10-CM | POA: Diagnosis present

## 2013-12-03 DIAGNOSIS — I129 Hypertensive chronic kidney disease with stage 1 through stage 4 chronic kidney disease, or unspecified chronic kidney disease: Secondary | ICD-10-CM | POA: Diagnosis present

## 2013-12-03 DIAGNOSIS — E785 Hyperlipidemia, unspecified: Secondary | ICD-10-CM | POA: Diagnosis present

## 2013-12-03 DIAGNOSIS — D72829 Elevated white blood cell count, unspecified: Secondary | ICD-10-CM | POA: Diagnosis present

## 2013-12-03 DIAGNOSIS — Z23 Encounter for immunization: Secondary | ICD-10-CM | POA: Diagnosis not present

## 2013-12-03 DIAGNOSIS — F29 Unspecified psychosis not due to a substance or known physiological condition: Secondary | ICD-10-CM | POA: Diagnosis present

## 2013-12-03 DIAGNOSIS — Z7982 Long term (current) use of aspirin: Secondary | ICD-10-CM | POA: Diagnosis not present

## 2013-12-03 DIAGNOSIS — G9341 Metabolic encephalopathy: Secondary | ICD-10-CM | POA: Diagnosis present

## 2013-12-03 DIAGNOSIS — K5900 Constipation, unspecified: Secondary | ICD-10-CM | POA: Diagnosis present

## 2013-12-03 DIAGNOSIS — F3289 Other specified depressive episodes: Secondary | ICD-10-CM | POA: Diagnosis present

## 2013-12-03 DIAGNOSIS — B370 Candidal stomatitis: Secondary | ICD-10-CM | POA: Diagnosis present

## 2013-12-03 DIAGNOSIS — E119 Type 2 diabetes mellitus without complications: Secondary | ICD-10-CM | POA: Diagnosis present

## 2013-12-03 DIAGNOSIS — IMO0002 Reserved for concepts with insufficient information to code with codable children: Secondary | ICD-10-CM | POA: Diagnosis not present

## 2013-12-03 DIAGNOSIS — G40909 Epilepsy, unspecified, not intractable, without status epilepticus: Secondary | ICD-10-CM | POA: Diagnosis present

## 2013-12-03 DIAGNOSIS — F0390 Unspecified dementia without behavioral disturbance: Secondary | ICD-10-CM | POA: Diagnosis present

## 2013-12-03 DIAGNOSIS — Z87891 Personal history of nicotine dependence: Secondary | ICD-10-CM | POA: Diagnosis not present

## 2013-12-03 DIAGNOSIS — N182 Chronic kidney disease, stage 2 (mild): Secondary | ICD-10-CM | POA: Diagnosis present

## 2013-12-03 DIAGNOSIS — I69959 Hemiplegia and hemiparesis following unspecified cerebrovascular disease affecting unspecified side: Secondary | ICD-10-CM | POA: Diagnosis not present

## 2013-12-03 DIAGNOSIS — Z66 Do not resuscitate: Secondary | ICD-10-CM | POA: Diagnosis present

## 2013-12-03 DIAGNOSIS — D638 Anemia in other chronic diseases classified elsewhere: Secondary | ICD-10-CM | POA: Diagnosis present

## 2013-12-03 DIAGNOSIS — E43 Unspecified severe protein-calorie malnutrition: Secondary | ICD-10-CM | POA: Diagnosis present

## 2013-12-03 DIAGNOSIS — N179 Acute kidney failure, unspecified: Secondary | ICD-10-CM

## 2013-12-03 DIAGNOSIS — K219 Gastro-esophageal reflux disease without esophagitis: Secondary | ICD-10-CM | POA: Diagnosis present

## 2013-12-03 DIAGNOSIS — N390 Urinary tract infection, site not specified: Secondary | ICD-10-CM | POA: Diagnosis present

## 2013-12-03 DIAGNOSIS — Z515 Encounter for palliative care: Secondary | ICD-10-CM | POA: Diagnosis not present

## 2013-12-03 DIAGNOSIS — Z79899 Other long term (current) drug therapy: Secondary | ICD-10-CM | POA: Diagnosis not present

## 2013-12-03 DIAGNOSIS — F329 Major depressive disorder, single episode, unspecified: Secondary | ICD-10-CM | POA: Diagnosis present

## 2013-12-03 LAB — URINE CULTURE

## 2013-12-03 LAB — BASIC METABOLIC PANEL
Anion gap: 9 (ref 5–15)
BUN: 18 mg/dL (ref 6–23)
CO2: 24 mEq/L (ref 19–32)
CREATININE: 0.82 mg/dL (ref 0.50–1.10)
Calcium: 9.1 mg/dL (ref 8.4–10.5)
Chloride: 105 mEq/L (ref 96–112)
GFR calc non Af Amer: 60 mL/min — ABNORMAL LOW (ref >90)
GFR, EST AFRICAN AMERICAN: 70 mL/min — AB (ref >90)
Glucose, Bld: 111 mg/dL — ABNORMAL HIGH (ref 70–99)
Potassium: 4.6 mEq/L (ref 3.7–5.3)
Sodium: 138 mEq/L (ref 137–147)

## 2013-12-03 LAB — GLUCOSE, CAPILLARY
GLUCOSE-CAPILLARY: 92 mg/dL (ref 70–99)
GLUCOSE-CAPILLARY: 99 mg/dL (ref 70–99)
Glucose-Capillary: 119 mg/dL — ABNORMAL HIGH (ref 70–99)

## 2013-12-03 LAB — MRSA PCR SCREENING: MRSA by PCR: NEGATIVE

## 2013-12-03 LAB — CBC
HEMATOCRIT: 32.5 % — AB (ref 36.0–46.0)
Hemoglobin: 10.6 g/dL — ABNORMAL LOW (ref 12.0–15.0)
MCH: 28.4 pg (ref 26.0–34.0)
MCHC: 32.6 g/dL (ref 30.0–36.0)
MCV: 87.1 fL (ref 78.0–100.0)
Platelets: 229 10*3/uL (ref 150–400)
RBC: 3.73 MIL/uL — ABNORMAL LOW (ref 3.87–5.11)
RDW: 14 % (ref 11.5–15.5)
WBC: 3.8 10*3/uL — ABNORMAL LOW (ref 4.0–10.5)

## 2013-12-03 MED ORDER — FLUCONAZOLE IN SODIUM CHLORIDE 200-0.9 MG/100ML-% IV SOLN
200.0000 mg | Freq: Once | INTRAVENOUS | Status: AC
  Administered 2013-12-03: 200 mg via INTRAVENOUS
  Filled 2013-12-03: qty 100

## 2013-12-03 MED ORDER — LORAZEPAM 2 MG/ML IJ SOLN: 0.5000 mg | Freq: Once | INTRAMUSCULAR | Status: AC | PRN

## 2013-12-03 MED ORDER — DEXTROSE 5 % IV SOLN
1.0000 g | INTRAVENOUS | Status: DC
  Administered 2013-12-03 – 2013-12-04 (×2): 1 g via INTRAVENOUS
  Filled 2013-12-03 (×3): qty 10

## 2013-12-03 MED ORDER — HALOPERIDOL LACTATE 5 MG/ML IJ SOLN: 1.0000 mg | Freq: Four times a day (QID) | INTRAMUSCULAR | Status: DC | PRN

## 2013-12-03 MED ORDER — INSULIN ASPART 100 UNIT/ML ~~LOC~~ SOLN
3.0000 [IU] | Freq: Three times a day (TID) | SUBCUTANEOUS | Status: DC
  Administered 2013-12-03 – 2013-12-05 (×2): 3 [IU] via SUBCUTANEOUS

## 2013-12-03 MED ORDER — INSULIN ASPART 100 UNIT/ML ~~LOC~~ SOLN: 0.0000 [IU] | Freq: Every day | SUBCUTANEOUS | Status: DC

## 2013-12-03 MED ORDER — PNEUMOCOCCAL VAC POLYVALENT 25 MCG/0.5ML IJ INJ
0.5000 mL | INJECTION | INTRAMUSCULAR | Status: AC
  Administered 2013-12-04: 0.5 mL via INTRAMUSCULAR
  Filled 2013-12-03: qty 0.5

## 2013-12-03 MED ORDER — WHITE PETROLATUM GEL
Status: AC
  Administered 2013-12-03: 1
  Filled 2013-12-03: qty 5

## 2013-12-03 MED ORDER — FLUCONAZOLE 100MG IVPB
100.0000 mg | INTRAVENOUS | Status: DC
  Administered 2013-12-04: 100 mg via INTRAVENOUS
  Filled 2013-12-03: qty 50

## 2013-12-03 MED ORDER — CETYLPYRIDINIUM CHLORIDE 0.05 % MT LIQD
7.0000 mL | Freq: Two times a day (BID) | OROMUCOSAL | Status: DC
  Administered 2013-12-03 – 2013-12-04 (×4): 7 mL via OROMUCOSAL

## 2013-12-03 MED ORDER — INSULIN ASPART 100 UNIT/ML ~~LOC~~ SOLN: 0.0000 [IU] | Freq: Three times a day (TID) | SUBCUTANEOUS | Status: DC

## 2013-12-03 NOTE — Progress Notes (Addendum)
Attempted to call patient's son, Jonelle Sports. No answer at this time. MD notified that patient does not have code status   7:18 PM Patient's son, Jonelle Sports returned RN call and states that he wants patient to be FULL code. Pt has out of facility DNR form in chart

## 2013-12-03 NOTE — Progress Notes (Signed)
[facility] PROGRESS NOTE Assessment/Plan: Metabolic encephalopathy superimposed on senile dementia/ Possible Seizure: - Unclear etiology, change bactrim to rocephin IV. Consulted neurology they relate unlikely seizure. - Most likely infection in nature. - EEG pending, cont depakote, haldol PRN - UC unreliable as she was previously on Cipro. - Consult PMT. - CT scan of the head showed no bleed  Oral thrush - Diflucan IV. - check a A1c.  HTN (hypertension)  - Resume home meds in the morning.   Chronic renal disease stage II:  - Creatinine baseline.   Anemia of chronic disease:  - Hbg at baseline.   Code Status: Only DNR  DVT Prophylaxis:heparin  Family Communication: none  Disposition Plan: inpatient    Consultants:  Neurology  Procedures:  MRI brain  EEG  Antibiotics:  Rocephin  HPI/Subjective: No complains  Objective: Filed Vitals:   12/02/13 2247 12/03/13 0151 12/03/13 0524 12/03/13 1001  BP: 188/70 156/84 161/67 143/59  Pulse: 64 63 58 67  Temp: 97.9 F (36.6 C)  98.1 F (36.7 C)   TempSrc: Oral  Axillary   Resp: 15  14   Height:      Weight:      SpO2: 98%  98%    No intake or output data in the 24 hours ending 12/03/13 1017 Filed Weights   12/02/13 1903  Weight: 55 kg (121 lb 4.1 oz)    Exam:  General: Alert, awake, oriented x2, in no acute distress.  HEENT: No bruits, no goiter.  Heart: Regular rate and rhythm. Lungs: Good air movement, clear Abdomen: Soft, nontender, nondistended, positive bowel sounds.   Data Reviewed: Basic Metabolic Panel:  Recent Labs Lab 12/02/13 1300 12/03/13 0432  NA 140 138  K 4.3 4.6  CL 104 105  CO2 24 24  GLUCOSE 94 111*  BUN 23 18  CREATININE 1.01 0.82  CALCIUM 9.4 9.1  MG 2.1  --   PHOS 3.1  --    Liver Function Tests:  Recent Labs Lab 12/02/13 1300  AST 16  ALT 10  ALKPHOS 73  BILITOT 0.2*  PROT 6.9  ALBUMIN 2.9*   No results found for this basename: LIPASE,  AMYLASE,  in the last 168 hours No results found for this basename: AMMONIA,  in the last 168 hours CBC:  Recent Labs Lab 12/02/13 1300 12/03/13 0432  WBC 4.1 3.8*  NEUTROABS 2.3  --   HGB 11.1* 10.6*  HCT 34.4* 32.5*  MCV 88.0 87.1  PLT 222 229   Cardiac Enzymes:  Recent Labs Lab 12/02/13 1300  TROPONINI <0.30   BNP (last 3 results) No results found for this basename: PROBNP,  in the last 8760 hours CBG:  Recent Labs Lab 12/02/13 1255  GLUCAP 107*    No results found for this or any previous visit (from the past 240 hour(s)).   Studies: Ct Head Wo Contrast  12/02/2013   CLINICAL DATA:  Altered mental status  EXAM: CT HEAD WITHOUT CONTRAST  TECHNIQUE: Contiguous axial images were obtained from the base of the skull through the vertex without intravenous contrast.  COMPARISON:  Brain CT 01/10/2012  FINDINGS: Extensive periventricular and subcortical white matter hypodensity compatible with chronic small vessel ischemic change. More extensive high right frontal and parietal lobe hypodensity is favored to represent interval evolution right MCA infarct and chronic small vessel ischemic change. Patchy hypoattenuation within the right basal ganglia. No definite evidence for acute cortically based infarct, mass lesion, mass effect or hemorrhage. Bilateral cataract  surgery. Paranasal sinuses are unremarkable. Mastoid air cells unremarkable. Calvarium intact.  IMPRESSION: Patchy hypoattenuation within the region of the right basal ganglia may represent age indeterminate infarct, favored to be chronic.  Extensive chronic small vessel ischemic change. Increased white matter hypodensity within the high right frontal and parietal lobes is favored to represent worsening chronic small vessel ischemic change.   Electronically Signed   By: Lovey Newcomer M.D.   On: 12/02/2013 14:19   Dg Chest Port 1 View  12/02/2013   CLINICAL DATA:  Altered level of consciousness.  EXAM: PORTABLE CHEST - 1 VIEW   COMPARISON:  01/07/2012  FINDINGS: The heart size and mediastinal contours are within normal limits. Lung volumes are low. There is no evidence of pulmonary edema, consolidation, pneumothorax, nodule or pleural fluid. The visualized skeletal structures are unremarkable.  IMPRESSION: No active disease.   Electronically Signed   By: Aletta Edouard M.D.   On: 12/02/2013 13:42    Scheduled Meds: . aspirin EC  325 mg Oral Daily  . divalproex  125 mg Oral QHS  . ferrous sulfate  325 mg Oral Q breakfast  . heparin  5,000 Units Subcutaneous 3 times per day  . hydrALAZINE  50 mg Oral 4 times per day  . lisinopril  30 mg Oral Daily  . metoprolol  50 mg Oral BID  . [START ON 12/04/2013] pneumococcal 23 valent vaccine  0.5 mL Intramuscular Tomorrow-1000  . polyethylene glycol  17 g Oral Daily  . protein supplement  1 scoop Oral TID WC  . saccharomyces boulardii  250 mg Oral BID  . senna-docusate  1 tablet Oral Daily  . sulfamethoxazole-trimethoprim  80 mg Intravenous Q12H  . white petrolatum       Continuous Infusions: . dextrose 5 % and 0.45% NaCl 75 mL/hr at 12/02/13 1940     Charlynne Cousins  [facility] Pager [phone]. If 8PM-8AM, please contact night-coverage at [email or web address], password [facility] 12/03/2013, 10:17 AM  LOS: 1 day      **Disclaimer: This note may have been dictated with voice recognition software. Similar sounding words can inadvertently be transcribed and this note may contain transcription errors which may not have been corrected upon publication of note.**

## 2013-12-03 NOTE — Progress Notes (Signed)
Patient has hx of DM and is NPO diet. MD Feliz-Ortiz text paged if patient should be accu-checks. No orders at this time.

## 2013-12-03 NOTE — Progress Notes (Signed)
Subjective: States that she feels better  Exam: Filed Vitals:   12/03/13 1426  BP: 159/57  Pulse: 67  Temp: 98.7 F (37.1 C)  Resp: 18   Gen: In bed, NAD MS: Awake, alert, not oriented other than person CN: Pupils round reactive to light, extraocular movements intact Motor: Moves all extremities to command Sensory: Intact to light touch  Impression: 80 year old female with dementia and superimposed delirium. It is unclear to me that she had a seizure, however if this is the case then I suspect is likely a provoked seizure given that she was on fluoroquinolone therapy. More likely, I suspect that this is the metabolic encephalopathy related to her UTI and dementia.  Recommendations: 1) agree with finishing therapy for UTI 2) agree with changing from fluoroquinolone to another agent 3) MRI/EEG   Roland Rack, MD [facility] [phone]  If 7pm- 7am, please page neurology on call as listed in [place].

## 2013-12-03 NOTE — Progress Notes (Signed)
UR completed 

## 2013-12-04 DIAGNOSIS — N390 Urinary tract infection, site not specified: Principal | ICD-10-CM

## 2013-12-04 DIAGNOSIS — E43 Unspecified severe protein-calorie malnutrition: Secondary | ICD-10-CM

## 2013-12-04 DIAGNOSIS — R5381 Other malaise: Secondary | ICD-10-CM

## 2013-12-04 DIAGNOSIS — Z515 Encounter for palliative care: Secondary | ICD-10-CM

## 2013-12-04 DIAGNOSIS — K5900 Constipation, unspecified: Secondary | ICD-10-CM

## 2013-12-04 LAB — GLUCOSE, CAPILLARY
GLUCOSE-CAPILLARY: 91 mg/dL (ref 70–99)
Glucose-Capillary: 80 mg/dL (ref 70–99)
Glucose-Capillary: 88 mg/dL (ref 70–99)
Glucose-Capillary: 96 mg/dL (ref 70–99)

## 2013-12-04 LAB — HEMOGLOBIN A1C
HEMOGLOBIN A1C: 5.9 % — AB (ref <5.7)
Mean Plasma Glucose: 123 mg/dL — ABNORMAL HIGH (ref <117)

## 2013-12-04 MED ORDER — FLUCONAZOLE 100 MG PO TABS
100.0000 mg | ORAL_TABLET | Freq: Every day | ORAL | Status: DC
  Administered 2013-12-05: 100 mg via ORAL
  Filled 2013-12-04: qty 1

## 2013-12-04 MED ORDER — SENNOSIDES 8.8 MG/5ML PO SYRP
5.0000 mL | ORAL_SOLUTION | Freq: Two times a day (BID) | ORAL | Status: DC
  Administered 2013-12-04 – 2013-12-05 (×3): 5 mL via ORAL
  Filled 2013-12-04 (×4): qty 5

## 2013-12-04 NOTE — Progress Notes (Signed)
Subjective: More awake today  Exam: Filed Vitals:   12/04/13 1750  BP: 157/59  Pulse: 60  Temp:   Resp:    Gen: In bed, NAD MS: Awake, alert, not oriented other than person CN: Pupils round reactive to light, extraocular movements intact Motor: Moves all extremities to command Sensory: Intact to light touch  Impression: 80 year old female with dementia and superimposed delirium. It is unclear to me that she had a seizure, however if this is the case then I suspect is likely a provoked seizure given that she was on fluoroquinolone therapy. More likely, I suspect that this is the metabolic encephalopathy related to her UTI and dementia. Shirley Friar can also cause delirium and this may have been contributing.   Recommendations: 1) EEG pending   Roland Rack, MD [facility] [phone]  If 7pm- 7am, please page neurology on call as listed in [place].

## 2013-12-04 NOTE — Progress Notes (Signed)
[facility] PROGRESS NOTE Assessment/Plan: Metabolic encephalopathy superimposed on senile dementia/ Possible Seizure: - Most likely infectious etiology - cont rocephin. Consulted neurology they relate unlikely seizure. - Most likely infection in nature. - EEG pending, cont depakote, haldol PRN - Consult PMT. - CT scan of the head showed no bleed  Oral thrush - Diflucan IV. - check a A1c.  HTN (hypertension)  - Resume home meds in the morning.   Chronic renal disease stage II:  - Creatinine baseline.   Anemia of chronic disease:  - Hbg at baseline.  Severe protein caloric malnutrition: - ensure TID  Code Status: Only DNR  DVT Prophylaxis:heparin  Family Communication: none  Disposition Plan: inpatient    Consultants:  Neurology  Procedures: MRI brain 9.5.2015: No acute infarct.  Remote right frontal lobe/peri opercular infarct.  EEG  Antibiotics:  Rocephin  HPI/Subjective: No complains  Objective: Filed Vitals:   12/03/13 1426 12/03/13 1755 12/03/13 2111 12/04/13 0535  BP: 159/57 170/66 164/63 159/90  Pulse: 67  73 61  Temp: 98.7 F (37.1 C)  98.6 F (37 C) 98.3 F (36.8 C)  TempSrc: Axillary  Oral Oral  Resp: 18  18 18   Height:      Weight:      SpO2: 98%  100% 100%    Intake/Output Summary (Last 24 hours) at 12/04/13 0803 Last data filed at 12/04/13 0538  Gross per 24 hour  Intake    230 ml  Output      1 ml  Net    229 ml   Filed Weights   12/02/13 1903  Weight: 55 kg (121 lb 4.1 oz)    Exam:  General: Alert, awake, oriented x2, in no acute distress. Cachectic appearing HEENT: No bruits, no goiter.  Heart: Regular rate and rhythm. Lungs: Good air movement, clear Abdomen: Soft, nontender, nondistended, positive bowel sounds.   Data Reviewed: Basic Metabolic Panel:  Recent Labs Lab 12/02/13 1300 12/03/13 0432  NA 140 138  K 4.3 4.6  CL 104 105  CO2 24 24  GLUCOSE 94 111*  BUN 23 18  CREATININE 1.01 0.82    CALCIUM 9.4 9.1  MG 2.1  --   PHOS 3.1  --    Liver Function Tests:  Recent Labs Lab 12/02/13 1300  AST 16  ALT 10  ALKPHOS 73  BILITOT 0.2*  PROT 6.9  ALBUMIN 2.9*   No results found for this basename: LIPASE, AMYLASE,  in the last 168 hours No results found for this basename: AMMONIA,  in the last 168 hours CBC:  Recent Labs Lab 12/02/13 1300 12/03/13 0432  WBC 4.1 3.8*  NEUTROABS 2.3  --   HGB 11.1* 10.6*  HCT 34.4* 32.5*  MCV 88.0 87.1  PLT 222 229   Cardiac Enzymes:  Recent Labs Lab 12/02/13 1300  TROPONINI <0.30   BNP (last 3 results) No results found for this basename: PROBNP,  in the last 8760 hours CBG:  Recent Labs Lab 12/02/13 1255 12/03/13 1203 12/03/13 1740 12/03/13 2136  GLUCAP 107* 119* 92 99    Recent Results (from the past 240 hour(s))  URINE CULTURE     Status: None   Collection Time    12/02/13  3:44 PM      Result Value Ref Range Status   Specimen Description URINE, RANDOM   Final   Special Requests NONE   Final   Culture  Setup Time     Final   Value: 12/02/2013 20:54  Performed at [place]     Final   Value: 50,000 COLONIES/ML     Performed at [facility]   Culture     Final   Value: Multiple bacterial morphotypes present, none predominant. Suggest appropriate recollection if clinically indicated.     Performed at [facility]   Report Status 12/03/2013 FINAL   Final  MRSA PCR SCREENING     Status: None   Collection Time    12/03/13 10:33 AM      Result Value Ref Range Status   MRSA by PCR NEGATIVE  NEGATIVE Final   Comment:            The GeneXpert MRSA Assay (FDA     approved for NASAL specimens     only), is one component of a     comprehensive MRSA colonization     surveillance program. It is not     intended to diagnose MRSA     infection nor to guide or     monitor treatment for     MRSA infections.     Studies: Ct Head Wo Contrast  12/02/2013   CLINICAL  DATA:  Altered mental status  EXAM: CT HEAD WITHOUT CONTRAST  TECHNIQUE: Contiguous axial images were obtained from the base of the skull through the vertex without intravenous contrast.  COMPARISON:  Brain CT 01/10/2012  FINDINGS: Extensive periventricular and subcortical white matter hypodensity compatible with chronic small vessel ischemic change. More extensive high right frontal and parietal lobe hypodensity is favored to represent interval evolution right MCA infarct and chronic small vessel ischemic change. Patchy hypoattenuation within the right basal ganglia. No definite evidence for acute cortically based infarct, mass lesion, mass effect or hemorrhage. Bilateral cataract surgery. Paranasal sinuses are unremarkable. Mastoid air cells unremarkable. Calvarium intact.  IMPRESSION: Patchy hypoattenuation within the region of the right basal ganglia may represent age indeterminate infarct, favored to be chronic.  Extensive chronic small vessel ischemic change. Increased white matter hypodensity within the high right frontal and parietal lobes is favored to represent worsening chronic small vessel ischemic change.   Electronically Signed   By: Lovey Newcomer M.D.   On: 12/02/2013 14:19   Mr Brain Wo Contrast  12/03/2013   CLINICAL DATA:  80 year old female with history of seizures and advanced dementia. Prior infarcts. Diabetes and chronic renal failure presenting with altered mental status.  EXAM: MRI HEAD WITHOUT CONTRAST  TECHNIQUE: Multiplanar, multiecho pulse sequences of the brain and surrounding structures were obtained without intravenous contrast.  COMPARISON:  12/02/2013 CT.  01/05/2012 brain MR.  FINDINGS: No acute infarct.  Remote right frontal lobe/peri opercular infarct.  Remote tiny hemorrhage within the thalamus more notable on the left. This may reflect result of prior hemorrhagic ischemia. Otherwise no evidence of intracranial hemorrhage.  Prominent small vessel disease type changes.  Global  atrophy without hydrocephalus.  No intracranial mass lesion noted on this unenhanced exam.  Atherosclerotic type changes right vertebral artery. Major intracranial vascular structures are patent.  Transverse ligament hypertrophy.  Degenerative changes C2-3.  Cervical medullary junction, pituitary region, pineal region and orbital structures unremarkable.  Attenuated genu of the corpus callosum may reflect result of prior infarct.  Hyperostosis frontalis interna.  IMPRESSION: No acute infarct.  Remote right frontal lobe/peri opercular infarct.  Remote tiny hemorrhage within the thalamus more notable on the left. This may reflect result of prior hemorrhagic ischemia.  Prominent small vessel disease type changes.  Global  atrophy without hydrocephalus.  Atherosclerotic type changes right vertebral artery. Major intracranial vascular structures are patent   Electronically Signed   By: Chauncey Cruel M.D.   On: 12/03/2013 17:18   Dg Chest Port 1 View  12/02/2013   CLINICAL DATA:  Altered level of consciousness.  EXAM: PORTABLE CHEST - 1 VIEW  COMPARISON:  01/07/2012  FINDINGS: The heart size and mediastinal contours are within normal limits. Lung volumes are low. There is no evidence of pulmonary edema, consolidation, pneumothorax, nodule or pleural fluid. The visualized skeletal structures are unremarkable.  IMPRESSION: No active disease.   Electronically Signed   By: Aletta Edouard M.D.   On: 12/02/2013 13:42    Scheduled Meds: . antiseptic oral rinse  7 mL Mouth Rinse BID  . aspirin EC  325 mg Oral Daily  . cefTRIAXone (ROCEPHIN)  IV  1 g Intravenous Q24H  . divalproex  125 mg Oral QHS  . ferrous sulfate  325 mg Oral Q breakfast  . fluconazole (DIFLUCAN) IV  100 mg Intravenous Q24H  . heparin  5,000 Units Subcutaneous 3 times per day  . hydrALAZINE  50 mg Oral 4 times per day  . insulin aspart  0-5 Units Subcutaneous QHS  . insulin aspart  0-9 Units Subcutaneous TID WC  . insulin aspart  3 Units  Subcutaneous TID WC  . lisinopril  30 mg Oral Daily  . metoprolol  50 mg Oral BID  . pneumococcal 23 valent vaccine  0.5 mL Intramuscular Tomorrow-1000  . polyethylene glycol  17 g Oral Daily  . protein supplement  1 scoop Oral TID WC  . saccharomyces boulardii  250 mg Oral BID  . senna-docusate  1 tablet Oral Daily   Continuous Infusions:     Charlynne Cousins  [facility] Pager [phone]. If 8PM-8AM, please contact night-coverage at [email or web address], password [facility] 12/04/2013, 8:03 AM  LOS: 2 days      **Disclaimer: This note may have been dictated with voice recognition software. Similar sounding words can inadvertently be transcribed and this note may contain transcription errors which may not have been corrected upon publication of note.**

## 2013-12-04 NOTE — Consult Note (Signed)
Patient Amy Martin      DOB: 04-Aug-1919      QJJ:[id number]     Consult Note from the Palliative Medicine Team at [facility] Requested by: Dr Aileen Fass    PCP: Ezequiel Kayser, MD Reason for Consultation: Goals of Care     Phone [phone]  Assessment/Recommendations:  1.  Code Status: Full. Has OOH DNR, but son Coralyn Pear wants this changed to Full.  Only advance Directive I see in chart is that Coralyn Pear is HCPOA (01/13/2012 document).  This gives him ability to revoke DNR if he desires. Will certainly try to re-address as able.   2. Goals of Care: I attempted to reach son Daryl at home and on cell today. No answer. I left him message with our number and encouraged him to call back.  If I do not hear from him today, I will certainly reach out again tomorrow. This appears to be her first hospitalization since 2013.  While she is doing poorly with possibel infection/metabolic encephalopathy, we may need to see how she bounces back (or not) to determine hospice eligiability.  She was reportely ambulating with wheelchair at nursing home, could converse some (though only oriented to person at baseline. Alb >2.5 on admission  And this is first hospitalization since 2013.  At minimum, I suspect she would benefit from outpatient palliative care consultation at discharge.    3. Symptom Management:     1. Constipation- Has trouble with pills currently. Will change doc/senna to liquid. Can continue miralax as well. No BM since admission.   2. Debility- unclear what baseline is to me.  Nursing home notes indicate that she could walk around with wheelchair. Reportedly has left sided weakness from prior CVA  3. Dementia/Confusion- appears that she ahs some mood disturbance at baseline for which she is on depakote for. From NH notes it appears she is only oriented to self. Possibly fairly conversant?. Will try to find out more from son.  Currently having trouble swallowing pills and  pocketing some of her food.    4. Psychosocial/Spiritual: Tells me she has 4 children. Son Reita Cliche is HCPOA.  Resides at [facility] under the care of [facility].     Brief HPI: Patient is a 80 yo female with PMHx of Demenita, CVA with left hemiparesis, CKD who presented with altered mental status. History obtained from chart as patient unable to provide with encephalopathy/baseline dementia.  She was recently treated for potential UTI with Cipro starting on 8/31.  She had MRI brain hear which did not reveal acute abnormality to explain AMS. Neuro eval and feels like this encephalopathy is more likely metabolic rather than seizure or other etiology.  Her cipro has been changed to bactrim to treat UTI.  She is also being treated for thrush   ROS: unable to obtain accurate ROS with baseline dementia and acute encephalopathy.     PMH:  Past Medical History  Diagnosis Date  . Cancer   . Stroke   . Diabetes mellitus   . Dementia   . Hypertension   . Hyperlipidemia   . Depression   . GERD (gastroesophageal reflux disease)      GYJ:EHUDJSH reviewed. No pertinent past surgical history. I have reviewed the [place] and SH and  If appropriate update it with new information. No Known Allergies Scheduled Meds: . antiseptic oral rinse  7 mL Mouth Rinse BID  . aspirin EC  325 mg Oral Daily  . cefTRIAXone (  ROCEPHIN)  IV  1 g Intravenous Q24H  . divalproex  125 mg Oral QHS  . ferrous sulfate  325 mg Oral Q breakfast  . fluconazole (DIFLUCAN) IV  100 mg Intravenous Q24H  . heparin  5,000 Units Subcutaneous 3 times per day  . hydrALAZINE  50 mg Oral 4 times per day  . insulin aspart  0-5 Units Subcutaneous QHS  . insulin aspart  0-9 Units Subcutaneous TID WC  . insulin aspart  3 Units Subcutaneous TID WC  . lisinopril  30 mg Oral Daily  . metoprolol  50 mg Oral BID  . polyethylene glycol  17 g Oral Daily  . protein supplement  1 scoop Oral TID WC  . saccharomyces boulardii  250 mg Oral  BID  . senna-docusate  1 tablet Oral Daily   Continuous Infusions:  PRN Meds:.acetaminophen, barrier cream, haloperidol lactate, nitroGLYCERIN, ondansetron (ZOFRAN) IV, ondansetron, polyethylene glycol    BP 142/56  Pulse 67  Temp(Src) 98.3 F (36.8 C) (Oral)  Resp 18  Ht 5\' 4"  (1.626 m)  Wt 55 kg (121 lb 4.1 oz)  BMI 20.80 kg/m2  SpO2 100%   PPS: Currently 20-30  Intake/Output Summary (Last 24 hours) at 12/04/13 1030 Last data filed at 12/04/13 0849  Gross per 24 hour  Intake    200 ml  Output      1 ml  Net    199 ml   LBM: ?  Physical Exam:   General: Alert, NAD HEENT:  [place], dry mm Neck: thin Chest:  CTAB CV: RRR Abdomen: soft, NT, ND, +BS Ext: no c/c/e Neuro: oriented to self. Chronic left sided weakness 2/2 stroke Skin: warm/dry  Labs: CBC    Component Value Date/Time   WBC 3.8* 12/03/2013 0432   WBC 4.5 10/25/2013   RBC 3.73* 12/03/2013 0432   HGB 10.6* 12/03/2013 0432   HCT 32.5* 12/03/2013 0432   PLT 229 12/03/2013 0432   MCV 87.1 12/03/2013 0432   MCH 28.4 12/03/2013 0432   MCHC 32.6 12/03/2013 0432   RDW 14.0 12/03/2013 0432   LYMPHSABS 1.2 12/02/2013 1300   MONOABS 0.4 12/02/2013 1300   EOSABS 0.2 12/02/2013 1300   BASOSABS 0.0 12/02/2013 1300    BMET    Component Value Date/Time   NA 138 12/03/2013 0432   NA 142 10/25/2013   K 4.6 12/03/2013 0432   CL 105 12/03/2013 0432   CO2 24 12/03/2013 0432   GLUCOSE 111* 12/03/2013 0432   BUN 18 12/03/2013 0432   BUN 26* 10/25/2013   CREATININE 0.82 12/03/2013 0432   CREATININE 1.1 10/25/2013   CALCIUM 9.1 12/03/2013 0432   GFRNONAA 60* 12/03/2013 0432   GFRAA 70* 12/03/2013 0432    CMP     Component Value Date/Time   NA 138 12/03/2013 0432   NA 142 10/25/2013   K 4.6 12/03/2013 0432   CL 105 12/03/2013 0432   CO2 24 12/03/2013 0432   GLUCOSE 111* 12/03/2013 0432   BUN 18 12/03/2013 0432   BUN 26* 10/25/2013   CREATININE 0.82 12/03/2013 0432   CREATININE 1.1 10/25/2013   CALCIUM 9.1 12/03/2013 0432   PROT 6.9 12/02/2013 1300   ALBUMIN 2.9*  12/02/2013 1300   AST 16 12/02/2013 1300   ALT 10 12/02/2013 1300   ALKPHOS 73 12/02/2013 1300   BILITOT 0.2* 12/02/2013 1300   GFRNONAA 60* 12/03/2013 0432   GFRAA 70* 12/03/2013 0432   9/4 CXR IMPRESSION:  No active disease.  9/ MRI Brain IMPRESSION:  No acute infarct.  Remote right frontal lobe/peri opercular infarct.  Remote tiny hemorrhage within the thalamus more notable on the left.  This may reflect result of prior hemorrhagic ischemia.  Prominent small vessel disease type changes.  Global atrophy without hydrocephalus.  Atherosclerotic type changes right vertebral artery. Major  intracranial vascular structures are patent     Greater than 50%  of this time was spent counseling and coordinating care related to the above assessment and plan.   Doran Clay D.O. Palliative Medicine Team at [facility]  Pager: [phone] Team Phone: [phone]

## 2013-12-05 ENCOUNTER — Inpatient Hospital Stay (HOSPITAL_COMMUNITY): Payer: Medicare Other

## 2013-12-05 DIAGNOSIS — N3000 Acute cystitis without hematuria: Secondary | ICD-10-CM

## 2013-12-05 DIAGNOSIS — I10 Essential (primary) hypertension: Secondary | ICD-10-CM

## 2013-12-05 LAB — GLUCOSE, CAPILLARY
GLUCOSE-CAPILLARY: 90 mg/dL (ref 70–99)
Glucose-Capillary: 105 mg/dL — ABNORMAL HIGH (ref 70–99)

## 2013-12-05 MED ORDER — FLUCONAZOLE 100 MG PO TABS: 100.0000 mg | ORAL_TABLET | Freq: Every day | ORAL | Status: DC

## 2013-12-05 MED ORDER — BISACODYL 10 MG RE SUPP
10.0000 mg | Freq: Once | RECTAL | Status: AC
  Administered 2013-12-05: 10 mg via RECTAL
  Filled 2013-12-05: qty 1

## 2013-12-05 MED ORDER — SULFAMETHOXAZOLE-TMP DS 800-160 MG PO TABS: 1.0000 | ORAL_TABLET | Freq: Two times a day (BID) | ORAL | Status: DC

## 2013-12-05 MED ORDER — SULFAMETHOXAZOLE-TMP DS 800-160 MG PO TABS
1.0000 | ORAL_TABLET | Freq: Two times a day (BID) | ORAL | Status: DC
  Administered 2013-12-05: 1 via ORAL
  Filled 2013-12-05 (×3): qty 1

## 2013-12-05 NOTE — Progress Notes (Signed)
Routine adult EEG completed. Results pending. 

## 2013-12-05 NOTE — Progress Notes (Signed)
Subjective: More awake today  Exam: Filed Vitals:   12/05/13 1003  BP: 173/56  Pulse: 60  Temp:   Resp:    Gen: In bed, NAD MS: Awake, alert, oriented to person, hospital.  CN: Pupils round reactive to light, extraocular movements intact Motor: Moves all extremities to command, complains of pain in RUE.  Sensory: Intact to light touch  Impression: 80 year old female with dementia and superimposed delirium. It is unclear to me that she had a seizure, however if this is the case then I suspect is likely a provoked seizure given that she was on fluoroquinolone therapy. More likely, I suspect that this is the metabolic encephalopathy related to her UTI and dementia. Shirley Friar can also cause delirium and this may have been contributing.   Recommendations: 1) EEG pending 2) if EEG does not demonstrate epileptiform activity,  neurology will sign off. Please call with any further questions.    Roland Rack, MD [facility] [phone]  If 7pm- 7am, please page neurology on call as listed in [place].

## 2013-12-05 NOTE — Progress Notes (Signed)
Amy Martin to be D/C'd [facility] per MD order.  Discussed with the patient and all questions fully answered.    Medication List    STOP taking these medications       ciprofloxacin 250 MG tablet  Commonly known as:  CIPRO      TAKE these medications       acetaminophen 325 MG tablet  Commonly known as:  TYLENOL  Take 650 mg by mouth every 4 (four) hours as needed. For pain     aspirin EC 325 MG tablet  Take 325 mg by mouth daily.     barrier cream Crea  Commonly known as:  non-specified  Apply 1 application topically 3 (three) times daily as needed (once a shift and after each incontinenet episode).     divalproex 125 MG DR tablet  Commonly known as:  DEPAKOTE  Take 125 mg by mouth at bedtime. For mood disorder     ferrous sulfate 325 (65 FE) MG tablet  Take 325 mg by mouth daily with breakfast.     fluconazole 100 MG tablet  Commonly known as:  DIFLUCAN  Take 1 tablet (100 mg total) by mouth daily.     hydrALAZINE 50 MG tablet  Commonly known as:  APRESOLINE  Take 50 mg by mouth every 6 (six) hours.     lisinopril 30 MG tablet  Commonly known as:  PRINIVIL,ZESTRIL  Take 30 mg by mouth daily.     metoprolol 50 MG tablet  Commonly known as:  LOPRESSOR  Take 50 mg by mouth 2 (two) times daily.     multivitamin with minerals Tabs tablet  Take 1 tablet by mouth daily.     nitroGLYCERIN 0.4 MG SL tablet  Commonly known as:  NITROSTAT  Place 0.4 mg under the tongue every 5 (five) minutes as needed for chest pain.     omeprazole 20 MG capsule  Commonly known as:  PRILOSEC  Take 20 mg by mouth daily before supper.     polyethylene glycol packet  Commonly known as:  MIRALAX / GLYCOLAX  Take 17 g by mouth daily.     protein supplement Powd  Take 1 scoop by mouth 3 (three) times daily with meals. In pudding     saccharomyces boulardii 250 MG capsule  Commonly known as:  FLORASTOR  Take 250 mg by mouth 2 (two) times daily. For 7 days starting  on 11/28/13     senna-docusate 8.6-50 MG per tablet  Commonly known as:  Senokot-S  Take 1 tablet by mouth daily. Hold for loose stools     sulfamethoxazole-trimethoprim 800-160 MG per tablet  Commonly known as:  BACTRIM DS  Take 1 tablet by mouth 2 (two) times daily.        VVS. Barrier cream applied to moisture associated skin damage to sacrum.  IV catheter discontinued intact. Site without signs and symptoms of complications. Dressing and pressure applied.  An After Visit Summary was printed and given to the patient.  D/c education completed with patient/family including follow up instructions, medication list, d/c activities limitations if indicated, with other d/c instructions as indicated by MD - patient able to verbalize understanding, all questions fully answered.   Patient instructed to return to ED, call 911, or call MD for any changes in condition.   Patient escorted via EMS   Delman Cheadle 12/05/2013 2:26 PM

## 2013-12-05 NOTE — Clinical Social Work Psychosocial (Signed)
Clinical Social Work Department BRIEF PSYCHOSOCIAL ASSESSMENT 12/05/2013  Patient:  Amy Martin, Amy Martin     Account Number:  [id number]     Admit date:  12/02/2013  Clinical Social Worker:  Lovey Newcomer  Date/Time:  12/05/2013 11:30 AM  Referred by:  Physician  Date Referred:  12/05/2013 Referred for  SNF Placement   Other Referral:   Interview type:  Family Other interview type:   Patient unable to contribute to assessment. Patient's son [name] interviewed.    PSYCHOSOCIAL DATA Living Status:  FACILITY Admitted from facility:  [facility] Level of care:  [place] Primary support name:  [name] Primary support relationship to patient:  CHILD, ADULT Degree of support available:   Support is strong from family.    CURRENT CONCERNS Current Concerns  Post-Acute Placement   Other Concerns:   NA    SOCIAL WORK ASSESSMENT / PLAN CSW spoke with patient's son [name] by phone to complete assessment as there is currently no family at bedside. CSW explained that MD plans to DC patient today. [name] states that the patient is a long term resident of [facility] and plans for patient to return to the facility at discharge. [name] is happy with the care his mother receives at the facility. CSW will assist with DC.   Assessment/plan status:  Psychosocial Support/Ongoing Assessment of Needs Other assessment/ plan:   Complete FL2, Fax   Information/referral to community resources:   CSW contact information given to [name].    PATIENT'S/FAMILY'S RESPONSE TO PLAN OF CARE: Patient's son [name] plans for patient to DC to [facility] at discharge. CSW will assist.       Liz Beach MSW, [place], [place], [phone]

## 2013-12-05 NOTE — Progress Notes (Signed)
[facility] PROGRESS NOTE Assessment/Plan: Metabolic encephalopathy superimposed on senile dementia/ Possible Seizure: - cont rocephin. Consulted neurology they relate unlikely seizure. - UC < 100k colonies, previously on antibiotics. Can probably switch to oral bactrim. - EEG pending, cont depakote, haldol PRN - Consult PMT. - CT scan of the head showed no bleed. - MRI as below.  Oral thrush - Diflucan IV. - A1c 5.9.  HTN (hypertension)  - Resume home meds in the morning.   Chronic renal disease stage II:  - Creatinine baseline.   Anemia of chronic disease:  - Hbg at baseline.  Severe protein caloric malnutrition: - ensure TID  Code Status: Only DNR  DVT Prophylaxis:heparin  Family Communication: none  Disposition Plan: inpatient    Consultants:  Neurology  Procedures: MRI brain 9.5.2015: No acute infarct.  Remote right frontal lobe/peri opercular infarct.  EEG  Antibiotics:  Rocephin  HPI/Subjective: No complains  Objective: Filed Vitals:   12/04/13 1750 12/04/13 2220 12/05/13 0024 12/05/13 0352  BP: 157/59 146/65 175/64 141/46  Pulse: 60 60  59  Temp:  98.4 F (36.9 C)  98.1 F (36.7 C)  TempSrc:  Oral  Oral  Resp:  16  16  Height:      Weight:      SpO2:  100%  100%    Intake/Output Summary (Last 24 hours) at 12/05/13 0808 Last data filed at 12/05/13 0500  Gross per 24 hour  Intake    220 ml  Output      0 ml  Net    220 ml   Filed Weights   12/02/13 1903  Weight: 55 kg (121 lb 4.1 oz)    Exam:  General: Alert, awake, oriented x2, in no acute distress. Cachectic appearing HEENT: No bruits, no goiter.  Heart: Regular rate and rhythm. Lungs: Good air movement, clear Abdomen: Soft, nontender, nondistended, positive bowel sounds.   Data Reviewed: Basic Metabolic Panel:  Recent Labs Lab 12/02/13 1300 12/03/13 0432  NA 140 138  K 4.3 4.6  CL 104 105  CO2 24 24  GLUCOSE 94 111*  BUN 23 18  CREATININE 1.01 0.82    CALCIUM 9.4 9.1  MG 2.1  --   PHOS 3.1  --    Liver Function Tests:  Recent Labs Lab 12/02/13 1300  AST 16  ALT 10  ALKPHOS 73  BILITOT 0.2*  PROT 6.9  ALBUMIN 2.9*   No results found for this basename: LIPASE, AMYLASE,  in the last 168 hours No results found for this basename: AMMONIA,  in the last 168 hours CBC:  Recent Labs Lab 12/02/13 1300 12/03/13 0432  WBC 4.1 3.8*  NEUTROABS 2.3  --   HGB 11.1* 10.6*  HCT 34.4* 32.5*  MCV 88.0 87.1  PLT 222 229   Cardiac Enzymes:  Recent Labs Lab 12/02/13 1300  TROPONINI <0.30   BNP (last 3 results) No results found for this basename: PROBNP,  in the last 8760 hours CBG:  Recent Labs Lab 12/04/13 0808 12/04/13 1157 12/04/13 1701 12/04/13 2225 12/05/13 0759  GLUCAP 80 88 96 91 90    Recent Results (from the past 240 hour(s))  URINE CULTURE     Status: None   Collection Time    12/02/13  3:44 PM      Result Value Ref Range Status   Specimen Description URINE, RANDOM   Final   Special Requests NONE   Final   Culture  Setup Time  Final   Value: 12/02/2013 20:54     Performed at [place]     Final   Value: 50,000 COLONIES/ML     Performed at [facility]   Culture     Final   Value: Multiple bacterial morphotypes present, none predominant. Suggest appropriate recollection if clinically indicated.     Performed at [facility]   Report Status 12/03/2013 FINAL   Final  MRSA PCR SCREENING     Status: None   Collection Time    12/03/13 10:33 AM      Result Value Ref Range Status   MRSA by PCR NEGATIVE  NEGATIVE Final   Comment:            The GeneXpert MRSA Assay (FDA     approved for NASAL specimens     only), is one component of a     comprehensive MRSA colonization     surveillance program. It is not     intended to diagnose MRSA     infection nor to guide or     monitor treatment for     MRSA infections.     Studies: Mr Herby Abraham  Contrast  12/03/2013   CLINICAL DATA:  80 year old female with history of seizures and advanced dementia. Prior infarcts. Diabetes and chronic renal failure presenting with altered mental status.  EXAM: MRI HEAD WITHOUT CONTRAST  TECHNIQUE: Multiplanar, multiecho pulse sequences of the brain and surrounding structures were obtained without intravenous contrast.  COMPARISON:  12/02/2013 CT.  01/05/2012 brain MR.  FINDINGS: No acute infarct.  Remote right frontal lobe/peri opercular infarct.  Remote tiny hemorrhage within the thalamus more notable on the left. This may reflect result of prior hemorrhagic ischemia. Otherwise no evidence of intracranial hemorrhage.  Prominent small vessel disease type changes.  Global atrophy without hydrocephalus.  No intracranial mass lesion noted on this unenhanced exam.  Atherosclerotic type changes right vertebral artery. Major intracranial vascular structures are patent.  Transverse ligament hypertrophy.  Degenerative changes C2-3.  Cervical medullary junction, pituitary region, pineal region and orbital structures unremarkable.  Attenuated genu of the corpus callosum may reflect result of prior infarct.  Hyperostosis frontalis interna.  IMPRESSION: No acute infarct.  Remote right frontal lobe/peri opercular infarct.  Remote tiny hemorrhage within the thalamus more notable on the left. This may reflect result of prior hemorrhagic ischemia.  Prominent small vessel disease type changes.  Global atrophy without hydrocephalus.  Atherosclerotic type changes right vertebral artery. Major intracranial vascular structures are patent   Electronically Signed   By: Chauncey Cruel M.D.   On: 12/03/2013 17:18    Scheduled Meds: . antiseptic oral rinse  7 mL Mouth Rinse BID  . aspirin EC  325 mg Oral Daily  . cefTRIAXone (ROCEPHIN)  IV  1 g Intravenous Q24H  . divalproex  125 mg Oral QHS  . ferrous sulfate  325 mg Oral Q breakfast  . fluconazole  100 mg Oral Daily  . heparin  5,000  Units Subcutaneous 3 times per day  . hydrALAZINE  50 mg Oral 4 times per day  . insulin aspart  0-5 Units Subcutaneous QHS  . insulin aspart  0-9 Units Subcutaneous TID WC  . insulin aspart  3 Units Subcutaneous TID WC  . lisinopril  30 mg Oral Daily  . metoprolol  50 mg Oral BID  . polyethylene glycol  17 g Oral Daily  . protein supplement  1 scoop Oral  TID WC  . saccharomyces boulardii  250 mg Oral BID  . sennosides  5 mL Oral BID   Continuous Infusions:     Charlynne Cousins  [facility] Pager [phone]. If 8PM-8AM, please contact night-coverage at [email or web address], password [facility] 12/05/2013, 8:08 AM  LOS: 3 days      **Disclaimer: This note may have been dictated with voice recognition software. Similar sounding words can inadvertently be transcribed and this note may contain transcription errors which may not have been corrected upon publication of note.**

## 2013-12-05 NOTE — Progress Notes (Signed)
Report given to Ander Purpura, LPN at [facility] for d/c today

## 2013-12-05 NOTE — Progress Notes (Signed)
Patient BS:[id number] Amy Martin      DOB: 20-Jul-1919      QHU:[id number]   Palliative Medicine Team at [facility] Progress Note    Subjective: Pleasant. Ate some lunch today. Has not moved bowels since admission.  Denies pain, N/V, dyspnea    Filed Vitals:   12/05/13 1003  BP: 173/56  Pulse: 60  Temp:   Resp:    Physical exam: General: Alert, NAD  HEENT: [place], mmm Chest: CTAB  CV: RRR  Abdomen: soft, NT, ND, +BS  Ext: no c/c/e  Neuro: oriented to self. Chronic left sided weakness 2/2 stroke    Assessment and plan: 81 yo female with dementia, CVA hx, CKD who presented with AMS.  Palliative consulted for goals of care  1. Code Status: Full. Family does not want OOH DNR which stems mostly from mistrust of nursing home care  2. Goals of Care:  See initial consult. Spoke with Lupita Dawn and his wife today. Most of conversation revolved around frustrations regarding nursing home care. Has been ongoing issue. They have looked into transfer to other facilities but limited options for LTC with medicaid/medicare.  They feel like her care there is awful.  Feel nursing home makes "excuses for her decline and just blame dementia".  They feel like she has been losing weight and had some functional decline voer past several months because nursing home isn't caring for her properly (not cleaning, changing her, providing right diet, helping her feed).  Family reports they try their best when there and she eats well for them.  They have reportedly told nursing home to get rid of OOH DNR, but this appears not to have happened.  They are open to changing this based on what is going on in hospital, but worry that OOH DNR at nursing home will lead to further decline in her care.  We talked through this some but little progress with their frustrations.   3. Symptom Management:  1. Constipation- continue senna liquid and other bowel meds. I will give bisacodyl supp today.  2. Debility- appears to be able to  push herself around in nursing home. Suspect she is close to baseline.  3. Dementia/Confusion- see initial consult. Suspect close to baseline.   4. Psychosocial/Spiritual:  Son Reita Cliche is HCPOA. Resides at [place] under the care of [facility]    Time In: 200 Time Out: 230 Total Time: 30 minutes  >50% of time spent in counseling and coordination of care regarding above plan.   Doran Clay D.O. Palliative Medicine Team at [facility]  Pager: [phone] Team Phone: [phone]

## 2013-12-05 NOTE — Procedures (Signed)
History: 80 yo F with delirium  Sedation: None  Technique: This is a 17 channel routine scalp EEG performed at the bedside with bipolar and monopolar montages arranged in accordance to the international 10/20 system of electrode placement. One channel was dedicated to EKG recording.    Background: The recording starts with sleep consisting of low voltage delta. With arousal, there is a well defined posterior dominant rhythm of 7 Hz that attenuates with eye opening. The delta activity attenuates. She then later falls back asleep with k-complexes and spindles with noraml distribution.   Photic stimulation: Physiologic driving is note performed  EEG Abnormalities: 1) slow PDR  Clinical Interpretation: This EEG is consistent with a mild non-specific encephalopathy as can be seen in dementing illness. There was no seizure or seizure predisposition recorded on this study.   Roland Rack, MD [facility] [phone]  If 7pm- 7am, please page neurology on call as listed in [place].

## 2013-12-05 NOTE — Progress Notes (Signed)
Clinical Social Worker facilitated patient discharge including contacting patient family (sone- Ruhi Kopke and facility to confirm patient discharge plans.  Clinical information faxed to facility and family agreeable with plan. CSW arranged ambulance transport via [facility] to [facility] . RN to call report prior to discharge. EMS to transport at 4 pm. Son is scheduled to meet with Palliative Care MD today at 2 pm.  Clinical Social Worker will sign off for now as social work intervention is no longer needed. Please consult [place] again if new need arises. Kendell Bane, [place]

## 2013-12-05 NOTE — Discharge Summary (Signed)
Physician Discharge Summary  Amy Martin JJH:[id number] DOB: 11/29/1919 DOA: 12/02/2013  PCP: Amy Kayser, MD  Admit date: 12/02/2013 Discharge date: 12/05/2013  Time spent: 35 minutes  Recommendations for Outpatient Follow-up:  1. Follow up with PCP in 4 weeks. 2. PMT to follow at facility  Discharge Diagnoses:  Active Problems:   HTN (hypertension)   Senile dementia, uncomplicated   Metabolic encephalopathy   Seizure   UTI (lower urinary tract infection)   Protein-calorie malnutrition, severe   Discharge Condition: guarded  Diet recommendation: heart healthy  Filed Weights   12/02/13 1903  Weight: 55 kg (121 lb 4.1 oz)    History of present illness:  80 y.o. female  With past medical history of seizure and advanced dementia, CVA, diabetes and chronic renal disease that comes in for alternative status. Also the history was obtained from the chart as the patient is incoherent and no family members are at bedside. As per record she was at her baseline quite interactive a week ago. She saw her the day of admission and the patient appeared to be more confused.      Hospital Course:  Metabolic encephalopathy superimposed on senile dementia/ Possible Seizure:  - Started on rocephin on admisison. Consulted neurology. - UC < 100k colonies, previously on antibiotics. Can probably switch to oral bactrim.  - EEG no focal seizures, cont depakote. - Consult PMT follow up at facility. - CT scan of the head showed no bleed.  - MRI as below.   Oral thrush  - Diflucan IV.  - improved, change to oral for 7 days.  HTN (hypertension)  - Resume home meds in the morning.   Chronic renal disease stage II:  - Creatinine baseline.   Anemia of chronic disease:  - Hbg at baseline.   Severe protein caloric malnutrition:  - ensure TID  Procedures: EEG no obvious eliptiform wave. MRI brain 9.5.2015: No acute infarct. Remote right frontal lobe/peri opercular  infarct.   Consultations:  neurology  Discharge Exam: Filed Vitals:   12/05/13 1003  BP: 173/56  Pulse: 60  Temp:   Resp:     General: see progress note  Discharge Instructions You were cared for by a hospitalist during your hospital stay. If you have any questions about your discharge medications or the care you received while you were in the hospital after you are discharged, you can call the unit and asked to speak with the hospitalist on call if the hospitalist that took care of you is not available. Once you are discharged, your primary care physician will handle any further medical issues. Please note that NO REFILLS for any discharge medications will be authorized once you are discharged, as it is imperative that you return to your primary care physician (or establish a relationship with a primary care physician if you do not have one) for your aftercare needs so that they can reassess your need for medications and monitor your lab values.  Discharge Instructions   Diet - low sodium heart healthy    Complete by:  As directed      Increase activity slowly    Complete by:  As directed           Current Discharge Medication List    START taking these medications   Details  fluconazole (DIFLUCAN) 100 MG tablet Take 1 tablet (100 mg total) by mouth daily. Qty: 7 tablet, Refills: 0    sulfamethoxazole-trimethoprim (BACTRIM DS) 800-160 MG per tablet Take 1 tablet by mouth  2 (two) times daily. Qty: 14 tablet, Refills: 0      CONTINUE these medications which have NOT CHANGED   Details  acetaminophen (TYLENOL) 325 MG tablet Take 650 mg by mouth every 4 (four) hours as needed. For pain    aspirin EC 325 MG tablet Take 325 mg by mouth daily.    barrier cream (NON-SPECIFIED) CREA Apply 1 application topically 3 (three) times daily as needed (once a shift and after each incontinenet episode).    divalproex (DEPAKOTE) 125 MG DR tablet Take 125 mg by mouth at bedtime. For mood  disorder    ferrous sulfate 325 (65 FE) MG tablet Take 325 mg by mouth daily with breakfast.     hydrALAZINE (APRESOLINE) 50 MG tablet Take 50 mg by mouth every 6 (six) hours.    lisinopril (PRINIVIL,ZESTRIL) 30 MG tablet Take 30 mg by mouth daily.    metoprolol (LOPRESSOR) 50 MG tablet Take 50 mg by mouth 2 (two) times daily.    Multiple Vitamin (MULTIVITAMIN WITH MINERALS) TABS Take 1 tablet by mouth daily.    nitroGLYCERIN (NITROSTAT) 0.4 MG SL tablet Place 0.4 mg under the tongue every 5 (five) minutes as needed for chest pain.    omeprazole (PRILOSEC) 20 MG capsule Take 20 mg by mouth daily before supper.    polyethylene glycol (MIRALAX / GLYCOLAX) packet Take 17 g by mouth daily.    protein supplement (RESOURCE BENEPROTEIN) POWD Take 1 scoop by mouth 3 (three) times daily with meals. In pudding    saccharomyces boulardii (FLORASTOR) 250 MG capsule Take 250 mg by mouth 2 (two) times daily. For 7 days starting on 11/28/13    senna-docusate (SENOKOT-S) 8.6-50 MG per tablet Take 1 tablet by mouth daily. Hold for loose stools      STOP taking these medications     ciprofloxacin (CIPRO) 250 MG tablet        No Known Allergies    The results of significant diagnostics from this hospitalization (including imaging, microbiology, ancillary and laboratory) are listed below for reference.    Significant Diagnostic Studies: Ct Head Wo Contrast  12/02/2013   CLINICAL DATA:  Altered mental status  EXAM: CT HEAD WITHOUT CONTRAST  TECHNIQUE: Contiguous axial images were obtained from the base of the skull through the vertex without intravenous contrast.  COMPARISON:  Brain CT 01/10/2012  FINDINGS: Extensive periventricular and subcortical white matter hypodensity compatible with chronic small vessel ischemic change. More extensive high right frontal and parietal lobe hypodensity is favored to represent interval evolution right MCA infarct and chronic small vessel ischemic change. Patchy  hypoattenuation within the right basal ganglia. No definite evidence for acute cortically based infarct, mass lesion, mass effect or hemorrhage. Bilateral cataract surgery. Paranasal sinuses are unremarkable. Mastoid air cells unremarkable. Calvarium intact.  IMPRESSION: Patchy hypoattenuation within the region of the right basal ganglia may represent age indeterminate infarct, favored to be chronic.  Extensive chronic small vessel ischemic change. Increased white matter hypodensity within the high right frontal and parietal lobes is favored to represent worsening chronic small vessel ischemic change.   Electronically Signed   By: Lovey Newcomer M.D.   On: 12/02/2013 14:19   Mr Brain Wo Contrast  12/03/2013   CLINICAL DATA:  80 year old female with history of seizures and advanced dementia. Prior infarcts. Diabetes and chronic renal failure presenting with altered mental status.  EXAM: MRI HEAD WITHOUT CONTRAST  TECHNIQUE: Multiplanar, multiecho pulse sequences of the brain and surrounding structures were obtained without  intravenous contrast.  COMPARISON:  12/02/2013 CT.  01/05/2012 brain MR.  FINDINGS: No acute infarct.  Remote right frontal lobe/peri opercular infarct.  Remote tiny hemorrhage within the thalamus more notable on the left. This may reflect result of prior hemorrhagic ischemia. Otherwise no evidence of intracranial hemorrhage.  Prominent small vessel disease type changes.  Global atrophy without hydrocephalus.  No intracranial mass lesion noted on this unenhanced exam.  Atherosclerotic type changes right vertebral artery. Major intracranial vascular structures are patent.  Transverse ligament hypertrophy.  Degenerative changes C2-3.  Cervical medullary junction, pituitary region, pineal region and orbital structures unremarkable.  Attenuated genu of the corpus callosum may reflect result of prior infarct.  Hyperostosis frontalis interna.  IMPRESSION: No acute infarct.  Remote right frontal lobe/peri  opercular infarct.  Remote tiny hemorrhage within the thalamus more notable on the left. This may reflect result of prior hemorrhagic ischemia.  Prominent small vessel disease type changes.  Global atrophy without hydrocephalus.  Atherosclerotic type changes right vertebral artery. Major intracranial vascular structures are patent   Electronically Signed   By: Chauncey Cruel M.D.   On: 12/03/2013 17:18   Dg Chest Port 1 View  12/02/2013   CLINICAL DATA:  Altered level of consciousness.  EXAM: PORTABLE CHEST - 1 VIEW  COMPARISON:  01/07/2012  FINDINGS: The heart size and mediastinal contours are within normal limits. Lung volumes are low. There is no evidence of pulmonary edema, consolidation, pneumothorax, nodule or pleural fluid. The visualized skeletal structures are unremarkable.  IMPRESSION: No active disease.   Electronically Signed   By: Aletta Edouard M.D.   On: 12/02/2013 13:42    Microbiology: Recent Results (from the past 240 hour(s))  URINE CULTURE     Status: None   Collection Time    12/02/13  3:44 PM      Result Value Ref Range Status   Specimen Description URINE, RANDOM   Final   Special Requests NONE   Final   Culture  Setup Time     Final   Value: 12/02/2013 20:54     Performed at [place]     Final   Value: 50,000 COLONIES/ML     Performed at [facility]   Culture     Final   Value: Multiple bacterial morphotypes present, none predominant. Suggest appropriate recollection if clinically indicated.     Performed at [facility]   Report Status 12/03/2013 FINAL   Final  MRSA PCR SCREENING     Status: None   Collection Time    12/03/13 10:33 AM      Result Value Ref Range Status   MRSA by PCR NEGATIVE  NEGATIVE Final   Comment:            The GeneXpert MRSA Assay (FDA     approved for NASAL specimens     only), is one component of a     comprehensive MRSA colonization     surveillance program. It is not     intended to  diagnose MRSA     infection nor to guide or     monitor treatment for     MRSA infections.     Labs: Basic Metabolic Panel:  Recent Labs Lab 12/02/13 1300 12/03/13 0432  NA 140 138  K 4.3 4.6  CL 104 105  CO2 24 24  GLUCOSE 94 111*  BUN 23 18  CREATININE 1.01 0.82  CALCIUM 9.4 9.1  MG  2.1  --   PHOS 3.1  --    Liver Function Tests:  Recent Labs Lab 12/02/13 1300  AST 16  ALT 10  ALKPHOS 73  BILITOT 0.2*  PROT 6.9  ALBUMIN 2.9*   No results found for this basename: LIPASE, AMYLASE,  in the last 168 hours No results found for this basename: AMMONIA,  in the last 168 hours CBC:  Recent Labs Lab 12/02/13 1300 12/03/13 0432  WBC 4.1 3.8*  NEUTROABS 2.3  --   HGB 11.1* 10.6*  HCT 34.4* 32.5*  MCV 88.0 87.1  PLT 222 229   Cardiac Enzymes:  Recent Labs Lab 12/02/13 1300  TROPONINI <0.30   BNP: BNP (last 3 results) No results found for this basename: PROBNP,  in the last 8760 hours CBG:  Recent Labs Lab 12/04/13 0808 12/04/13 1157 12/04/13 1701 12/04/13 2225 12/05/13 0759  GLUCAP 80 88 96 91 90       Signed:  FELIZ ORTIZ, ABRAHAM  [facility] 12/05/2013, 11:29 AM

## 2013-12-05 NOTE — Clinical Social Work Note (Signed)
Per MD patient ready to DC back to [facility]. RN, patient/family ([name]), and facility notified of patient's DC. RN given number for report. DC packet on patient's chart. Ambulance transport requested for patient. CSW signing off at this time.   Liz Beach MSW, [place], [place], [phone]

## 2013-12-06 NOTE — Progress Notes (Signed)
CARE MANAGEMENT NOTE 12/06/2013  Patient:  Amy Martin, Amy Martin   Account Number:  [id number]  Date Initiated:  12/06/2013  Documentation initiated by:  [facility]  Subjective/Objective Assessment:   Metabolic encephalopathy     Action/Plan:   SNF   Anticipated DC Date:  12/05/2013   Anticipated DC Plan:  [place]  CM consult      Choice offered to / List presented to:             Status of service:  Completed, signed off Medicare Important Message given?  YES (If response is "NO", the following Medicare IM given date fields will be blank) Date Medicare IM given:  12/06/2013 Medicare IM given by:  [facility] Date Additional Medicare IM given:   Additional Medicare IM given by:    Discharge Disposition:  [place]  Per UR Regulation:  Reviewed for med. necessity/level of care/duration of stay  If discussed at [place] of Stay Meetings, dates discussed:    Comments:  Dc to SNF. Jonnie Finner RN CCM Case Mgmt

## 2013-12-07 ENCOUNTER — Non-Acute Institutional Stay (SKILLED_NURSING_FACILITY): Payer: Medicare Other | Admitting: Internal Medicine

## 2013-12-07 DIAGNOSIS — D638 Anemia in other chronic diseases classified elsewhere: Secondary | ICD-10-CM

## 2013-12-07 DIAGNOSIS — B370 Candidal stomatitis: Secondary | ICD-10-CM

## 2013-12-07 DIAGNOSIS — N189 Chronic kidney disease, unspecified: Secondary | ICD-10-CM

## 2013-12-07 DIAGNOSIS — G9341 Metabolic encephalopathy: Secondary | ICD-10-CM

## 2013-12-07 DIAGNOSIS — I10 Essential (primary) hypertension: Secondary | ICD-10-CM

## 2013-12-07 DIAGNOSIS — F0390 Unspecified dementia without behavioral disturbance: Secondary | ICD-10-CM

## 2013-12-07 DIAGNOSIS — E43 Unspecified severe protein-calorie malnutrition: Secondary | ICD-10-CM

## 2013-12-07 NOTE — Progress Notes (Signed)
MRN: [id number] Name: Amy Martin  Sex: female Age: 80 y.o. DOB: 06/09/1919  [facility] #: [facility] Facility/Room:305 Level Of Care: SNF Provider: Inocencio Homes D Emergency Contacts: Extended Emergency Contact Information Primary Emergency Contact: Galbreath,[name] Address: [address]          [place], [place] [address] Johnnette Litter of [place] Phone: [phone] Mobile Phone: [phone] Relation: Son  Code Status: FULL  Allergies: Review of patient's allergies indicates no known allergies.  Chief Complaint  Patient presents with  . nursing home admision    HPI: Patient is 80 y.o. female who is admitted to SNF with dementia and s/p UTI, hospitalized.  Past Medical History  Diagnosis Date  . Cancer   . Stroke   . Diabetes mellitus   . Dementia   . Hypertension   . Hyperlipidemia   . Depression   . GERD (gastroesophageal reflux disease)     History reviewed. No pertinent past surgical history.    Medication List       This list is accurate as of: 12/07/13 11:59 PM.  Always use your most recent med list.               acetaminophen 325 MG tablet  Commonly known as:  TYLENOL  Take 650 mg by mouth every 4 (four) hours as needed. For pain     aspirin EC 325 MG tablet  Take 325 mg by mouth daily.     barrier cream Crea  Commonly known as:  non-specified  Apply 1 application topically 3 (three) times daily as needed (once a shift and after each incontinenet episode).     divalproex 125 MG DR tablet  Commonly known as:  DEPAKOTE  Take 125 mg by mouth at bedtime. For mood disorder     ferrous sulfate 325 (65 FE) MG tablet  Take 325 mg by mouth daily with breakfast.     fluconazole 100 MG tablet  Commonly known as:  DIFLUCAN  Take 100 mg by mouth daily. For 7 days     hydrALAZINE 50 MG tablet  Commonly known as:  APRESOLINE  Take 50 mg by mouth every 6 (six) hours.     lisinopril 30 MG tablet  Commonly known as:  PRINIVIL,ZESTRIL  Take 30 mg by  mouth daily.     metoprolol 50 MG tablet  Commonly known as:  LOPRESSOR  Take 50 mg by mouth 2 (two) times daily.     multivitamin with minerals Tabs tablet  Take 1 tablet by mouth daily.     nitroGLYCERIN 0.4 MG SL tablet  Commonly known as:  NITROSTAT  Place 0.4 mg under the tongue every 5 (five) minutes as needed for chest pain.     omeprazole 20 MG capsule  Commonly known as:  PRILOSEC  Take 20 mg by mouth daily before supper.     polyethylene glycol packet  Commonly known as:  MIRALAX / GLYCOLAX  Take 17 g by mouth daily.     protein supplement Powd  Take 1 scoop by mouth 3 (three) times daily with meals. In pudding     saccharomyces boulardii 250 MG capsule  Commonly known as:  FLORASTOR  Take 250 mg by mouth 2 (two) times daily. For 7 days starting on 11/28/13     senna-docusate 8.6-50 MG per tablet  Commonly known as:  Senokot-S  Take 1 tablet by mouth daily. Hold for loose stools     sulfamethoxazole-trimethoprim 800-160 MG per tablet  Commonly  known as:  BACTRIM DS  Take 1 tablet by mouth 2 (two) times daily. For 7 days        Meds ordered this encounter  Medications  . fluconazole (DIFLUCAN) 100 MG tablet    Sig: Take 100 mg by mouth daily. For 7 days  . sulfamethoxazole-trimethoprim (BACTRIM DS) 800-160 MG per tablet    Sig: Take 1 tablet by mouth 2 (two) times daily. For 7 days    Immunization History  Administered Date(s) Administered  . PPD Test 01/12/2012  . Pneumococcal Polysaccharide-23 12/04/2013    History  Substance Use Topics  . Smoking status: Former Smoker    Types: Cigarettes    Quit date: 01/04/1979  . Smokeless tobacco: Not on file  . Alcohol Use: No    Family history is noncontributory    Review of Systems    UTO    Filed Vitals:   12/07/13 2036  BP: 116/54  Pulse: 67  Temp: 98.6 F (37 C)  Resp: 18    Physical Exam  GENERAL APPEARANCE: Alert, conversant. Appropriately groomed. No acute distress.  SKIN: No  diaphoresis rash HEAD: Normocephalic, atraumatic  EYES: Conjunctiva/lids clear. Pupils round, reactive. EOMs intact.  EARS: External exam WNL, canals clear. Hearing grossly normal.  NOSE: No deformity or discharge.  MOUTH/THROAT: Lips w/o lesions RESPIRATORY: Breathing is even, unlabored. Lung sounds are clear   CARDIOVASCULAR: Heart RRR no murmurs, rubs or gallops. No peripheral edema.   GASTROINTESTINAL: Abdomen is soft, non-tender, not distended w/ normal bowel sounds GENITOURINARY: Bladder non tender, not distended  MUSCULOSKELETAL: No abnormal joints or musculature NEUROLOGIC: Oriented X1. Cranial nerves 2-12 grossly intact; L side weakness PSYCHIATRIC: dementia, no behavioral issues  Patient Active Problem List   Diagnosis Date Noted  . Thrush 12/11/2013  . Protein-calorie malnutrition, severe 12/04/2013  . UTI (lower urinary tract infection) 12/03/2013  . Metabolic encephalopathy 56/43/3295  . Seizure 12/02/2013  . Esophageal reflux 07/22/2013  . UTI (urinary tract infection) 06/30/2013  . Hypopotassemia 04/22/2013  . Chronic kidney disease, unspecified 01/14/2013  . Secondary renovascular hypertension, benign 01/14/2013  . Altered mental status 12/14/2012  . Hyperglycemia 12/14/2012  . Anemia of other chronic disease 11/02/2012  . Hemorrhoid 11/02/2012  . Unspecified late effects of cerebrovascular disease 08/13/2012  . Essential hypertension, benign 08/13/2012  . Senile dementia, uncomplicated 18/84/1660  . Iron deficiency anemia, unspecified 08/13/2012  . Anemia 01/06/2012  . Lip swelling 01/06/2012  . AKI (acute kidney injury) 01/06/2012  . HTN (hypertension) 01/06/2012  . CVA (cerebral infarction) 01/04/2012    CBC    Component Value Date/Time   WBC 3.8* 12/03/2013 0432   WBC 4.5 10/25/2013   RBC 3.73* 12/03/2013 0432   HGB 10.6* 12/03/2013 0432   HCT 32.5* 12/03/2013 0432   PLT 229 12/03/2013 0432   MCV 87.1 12/03/2013 0432   LYMPHSABS 1.2 12/02/2013 1300   MONOABS  0.4 12/02/2013 1300   EOSABS 0.2 12/02/2013 1300   BASOSABS 0.0 12/02/2013 1300    CMP     Component Value Date/Time   NA 138 12/03/2013 0432   NA 142 10/25/2013   K 4.6 12/03/2013 0432   CL 105 12/03/2013 0432   CO2 24 12/03/2013 0432   GLUCOSE 111* 12/03/2013 0432   BUN 18 12/03/2013 0432   BUN 26* 10/25/2013   CREATININE 0.82 12/03/2013 0432   CREATININE 1.1 10/25/2013   CALCIUM 9.1 12/03/2013 0432   PROT 6.9 12/02/2013 1300   ALBUMIN 2.9* 12/02/2013 1300   AST  16 12/02/2013 1300   ALT 10 12/02/2013 1300   ALKPHOS 73 12/02/2013 1300   BILITOT 0.2* 12/02/2013 1300   GFRNONAA 60* 12/03/2013 0432   GFRAA 70* 12/03/2013 0432    Assessment and Plan  Metabolic encephalopathy superimposed on senile dementia/ Possible Seizure:  - Started on rocephin on admisison. Consulted neurology.  - UC < 100k colonies, previously on antibiotics. Can probably switch to oral bactrim.  - EEG no focal seizures, cont depakote.  - Consult PMT follow up at facility.  - CT scan of the head showed no bleed.  - MRI as below.    Thrush Diflucan IV.  - improved, change to oral for 7 days   HTN (hypertension) Continue 3 med regimen  Chronic kidney disease, unspecified Cr is baseline 0.82  Anemia of other chronic disease Hb baseline at  10.6;pt on iron  Senile dementia, uncomplicated Continue depakote  Protein-calorie malnutrition, severe Ensure TID    Hennie Duos, MD

## 2013-12-08 ENCOUNTER — Non-Acute Institutional Stay (SKILLED_NURSING_FACILITY): Payer: Medicare Other | Admitting: Internal Medicine

## 2013-12-08 DIAGNOSIS — N189 Chronic kidney disease, unspecified: Secondary | ICD-10-CM

## 2013-12-08 NOTE — Progress Notes (Signed)
Patient ID: Amy Martin, female   DOB: 1919-12-21, 80 y.o.   MRN: [id number]   this is an acute visit.  Level of care skilled.  Facility AF.  Chief complaint-acute visit followup renal insufficiency.   HPI: Patient is 80 y.o. female who is admitted to SNF with dementia and s/p UTI, hospitalized.-She is completing a course of Bactrim and appears to be stable in this regard Spiriva  She does have a history of chronic renal insufficiency-updated labs were ordered here which shows a BUN of 27 and creatinine of 1.4-appears to previously in facility her creatinine has ranged in the low ones-in the hospital  Appears  as low as 0.82-  She appears to be stable vital signs are stable she does ambulate in the hallway in her wheelchair-nursing staff has not noted any acute issues  Past Medical History   Diagnosis  Date   .  Cancer    .  Stroke    .  Diabetes mellitus    .  Dementia    .  Hypertension    .  Hyperlipidemia    .  Depression    .  GERD (gastroesophageal reflux disease)    History reviewed. No pertinent past surgical history.    Medication List         .            acetaminophen 325 MG tablet    Commonly known as: TYLENOL    Take 650 mg by mouth every 4 (four) hours as needed. For pain    aspirin EC 325 MG tablet    Take 325 mg by mouth daily.    barrier cream Crea    Commonly known as: non-specified    Apply 1 application topically 3 (three) times daily as needed (once a shift and after each incontinenet episode).    divalproex 125 MG DR tablet    Commonly known as: DEPAKOTE    Take 125 mg by mouth at bedtime. For mood disorder    ferrous sulfate 325 (65 FE) MG tablet    Take 325 mg by mouth daily with breakfast.    fluconazole 100 MG tablet    Commonly known as: DIFLUCAN    Take 100 mg by mouth daily. For 7 days    hydrALAZINE 50 MG tablet    Commonly known as: APRESOLINE    Take 50 mg by mouth every 6 (six) hours.    lisinopril 30 MG tablet    Commonly known  as: PRINIVIL,ZESTRIL    Take 30 mg by mouth daily.    metoprolol 50 MG tablet    Commonly known as: LOPRESSOR    Take 50 mg by mouth 2 (two) times daily.    multivitamin with minerals Tabs tablet    Take 1 tablet by mouth daily.    nitroGLYCERIN 0.4 MG SL tablet    Commonly known as: NITROSTAT    Place 0.4 mg under the tongue every 5 (five) minutes as needed for chest pain.    omeprazole 20 MG capsule    Commonly known as: PRILOSEC    Take 20 mg by mouth daily before supper.    polyethylene glycol packet    Commonly known as: MIRALAX / GLYCOLAX    Take 17 g by mouth daily.    protein supplement Powd    Take 1 scoop by mouth 3 (three) times daily with meals. In pudding    saccharomyces boulardii 250 MG capsule    Commonly known  as: FLORASTOR    Take 250 mg by mouth 2 (two) times daily. For 7 days starting on 11/28/13    senna-docusate 8.6-50 MG per tablet    Commonly known as: Senokot-S    Take 1 tablet by mouth daily. Hold for loose stools    sulfamethoxazole-trimethoprim 800-160 MG per tablet    Commonly known as: BACTRIM DS    Take 1 tablet by mouth 2 (two) times daily. For 7 days      Meds ordered this encounter   Medications   .  fluconazole (DIFLUCAN) 100 MG tablet     Sig: Take 100 mg by mouth daily. For 7 days   .  sulfamethoxazole-trimethoprim (BACTRIM DS) 800-160 MG per tablet     Sig: Take 1 tablet by mouth 2 (two) times daily. For 7 days    Immunization History   Administered  Date(s) Administered   .  PPD Test  01/12/2012   .  Pneumococcal Polysaccharide-23  12/04/2013    History   Substance Use Topics   .  Smoking status:  Former Smoker     Types:  Cigarettes     Quit date:  01/04/1979   .  Smokeless tobacco:  Not on file   .  Alcohol Use:  No   Family history is noncontributory  Review of Systems this is limited secondary to dementia-she is not complaining of pain-nursing staff says she appears to be at baseline and has not had any acute complaints                      Physical Exam  Temperature 98.3 pulse 60 respirations 20 blood pressure 129/58-139/63- GENERAL APPEARANCE: Alert, conversant. Appropriately groomed. No acute distress sitting in her wheelchair.  SKIN: No diaphoresis rash  HEAD: Normocephalic, atraumatic  EYES: Conjunctiva/lids clear. Pupils round, reactive. EOMs intact.  l.  NOSE: No deformity or discharge.  MOUTH/THROAT: Lips w/o lesions--oral pharynx does appear clear-mucous membranes appear fairly moist  RESPIRATORY: Breathing is even, unlabored. Lung sounds are clear  CARDIOVASCULAR: Heart RRR no murmurs, rubs or gallops. No peripheral edema.  GASTROINTESTINAL: Abdomen is soft, non-tender, not distended w/ normal bowel sounds  GENITOURINARY: Bladder non tender, not distended  MUSCULOSKELETAL: No abnormal joints or musculature  NEUROLOGIC: Oriented X1. Cranial nerves 2-12 grossly intact; L side weakness  PSYCHIATRIC: dementia, no behavioral issues  Patient Active Problem List    Diagnosis  Date Noted   .  Thrush  12/11/2013   .  Protein-calorie malnutrition, severe  12/04/2013   .  UTI (lower urinary tract infection)  12/03/2013   .  Metabolic encephalopathy  99/35/7017   .  Seizure  12/02/2013   .  Esophageal reflux  07/22/2013   .  UTI (urinary tract infection)  06/30/2013   .  Hypopotassemia  04/22/2013   .  Chronic kidney disease, unspecified  01/14/2013   .  Secondary renovascular hypertension, benign  01/14/2013   .  Altered mental status  12/14/2012   .  Hyperglycemia  12/14/2012   .  Anemia of other chronic disease  11/02/2012   .  Hemorrhoid  11/02/2012   .  Unspecified late effects of cerebrovascular disease  08/13/2012   .  Essential hypertension, benign  08/13/2012   .  Senile dementia, uncomplicated  79/39/0300   .  Iron deficiency anemia, unspecified  08/13/2012   .  Anemia  01/06/2012   .  Lip swelling  01/06/2012   .  AKI (  acute kidney injury)  01/06/2012   .  HTN (hypertension)   01/06/2012   .  CVA (cerebral infarction)  01/04/2012   CBC    Component  Value  Date/Time    WBC  3.8*  12/03/2013 0432    WBC  4.5  10/25/2013    RBC  3.73*  12/03/2013 0432    HGB  10.6*  12/03/2013 0432    HCT  32.5*  12/03/2013 0432    PLT  229  12/03/2013 0432    MCV  87.1  12/03/2013 0432    LYMPHSABS  1.2  12/02/2013 1300    MONOABS  0.4  12/02/2013 1300    EOSABS  0.2  12/02/2013 1300    BASOSABS  0.0  12/02/2013 1300   CMP    Component  Value  Date/Time    NA  138  12/03/2013 0432    NA  142  10/25/2013    K  4.6  12/03/2013 0432    CL  105  12/03/2013 0432    CO2  24  12/03/2013 0432    GLUCOSE  111*  12/03/2013 0432    BUN  18  12/03/2013 0432    BUN  26*  10/25/2013    CREATININE  0.82  12/03/2013 0432    CREATININE  1.1  10/25/2013    CALCIUM  9.1  12/03/2013 0432    PROT  6.9  12/02/2013 1300    ALBUMIN  2.9*  12/02/2013 1300    AST  16  12/02/2013 1300    ALT  10  12/02/2013 1300    ALKPHOS  73  12/02/2013 1300    BILITOT  0.2*  12/02/2013 1300    GFRNONAA  60*  12/03/2013 0432    GFRAA  70*  12/03/2013 0432   Assessment and Plan  Metabolic encephalopathy  superimposed on senile dementia/ Possible Seizure:  - Started on rocephin on admisison. Consulted neurology.  - UC < 100k colonies, previously on antibiotics were switched to Bactrim she is finishing the course of this.  - EEG no focal seizures, cont depakote.  -  - CT scan of the head showed no bleed.  -  Thrush  Diflucan IV.  - improved, change to oral for 7 days--this appears to be largely resolved  HTN (hypertension)  Continue 3 med regimen  Chronic kidney disease, unspecified  Creatinine appears to be on the upper end of recent baseline appears to be rising some-- we'll update this-also encourage fluids  Anemia of other chronic disease  Hb baseline at 10.6;pt on -we'll update lab on this as well  se nile dementia, uncomplicated  Continue depakote  Protein-calorie malnutrition, severe  Ensure TID  [id number]

## 2013-12-11 ENCOUNTER — Encounter: Payer: Self-pay | Admitting: Internal Medicine

## 2013-12-11 DIAGNOSIS — B370 Candidal stomatitis: Secondary | ICD-10-CM | POA: Insufficient documentation

## 2013-12-11 NOTE — Assessment & Plan Note (Signed)
Continue 3 med regimen

## 2013-12-11 NOTE — Assessment & Plan Note (Signed)
superimposed on senile dementia/ Possible Seizure:  - Started on rocephin on admisison. Consulted neurology.  - UC < 100k colonies, previously on antibiotics. Can probably switch to oral bactrim.  - EEG no focal seizures, cont depakote.  - Consult PMT follow up at facility.  - CT scan of the head showed no bleed.  - MRI as below.

## 2013-12-11 NOTE — Assessment & Plan Note (Signed)
Hb baseline at  10.6;pt on iron

## 2013-12-11 NOTE — Assessment & Plan Note (Signed)
Cr is baseline 0.82

## 2013-12-11 NOTE — Assessment & Plan Note (Signed)
Diflucan IV.  - improved, change to oral for 7 days

## 2013-12-11 NOTE — Assessment & Plan Note (Signed)
Continue depakote 

## 2013-12-11 NOTE — Assessment & Plan Note (Signed)
Ensure TID

## 2013-12-31 ENCOUNTER — Encounter: Payer: Self-pay | Admitting: Internal Medicine

## 2014-02-27 ENCOUNTER — Non-Acute Institutional Stay (SKILLED_NURSING_FACILITY): Payer: Medicare Other | Admitting: Nurse Practitioner

## 2014-02-27 DIAGNOSIS — K219 Gastro-esophageal reflux disease without esophagitis: Secondary | ICD-10-CM

## 2014-02-27 DIAGNOSIS — I10 Essential (primary) hypertension: Secondary | ICD-10-CM

## 2014-02-27 DIAGNOSIS — D509 Iron deficiency anemia, unspecified: Secondary | ICD-10-CM

## 2014-02-27 DIAGNOSIS — I6359 Cerebral infarction due to unspecified occlusion or stenosis of other cerebral artery: Secondary | ICD-10-CM

## 2014-02-27 DIAGNOSIS — K5900 Constipation, unspecified: Secondary | ICD-10-CM

## 2014-02-27 DIAGNOSIS — F0390 Unspecified dementia without behavioral disturbance: Secondary | ICD-10-CM

## 2014-02-27 NOTE — Progress Notes (Signed)
Patient ID: Amy Martin, female   DOB: November 09, 1919, 80 y.o.   MRN: [id number]    Nursing Home Location:  [place] of Service: SNF (31)  PCP: Merlene Laughter, MD  No Known Allergies  Chief Complaint  Patient presents with  . Medical Management of Chronic Issues    HPI:  Patient is a 80 y.o. female seen today at [facility] for routine follow up on chronic conditions. Pt with a pmh of CVA, DM, dementia, HTN, hyperlipidemia, anemia, GERD, constipation. Pt has been in her usual state of health since last visit. No acute issues noted. Pt reports constipation but staff reports routine and regular BMs.  Review of Systems: limited due to dementia Review of Systems  Constitutional: Negative for activity change, appetite change, fatigue and unexpected weight change.  HENT: Negative for congestion and hearing loss.   Eyes: Negative.   Respiratory: Negative for cough and shortness of breath.   Cardiovascular: Negative for chest pain, palpitations and leg swelling.  Gastrointestinal: Positive for constipation. Negative for abdominal pain and diarrhea.  Genitourinary: Negative for dysuria and difficulty urinating.  Musculoskeletal: Positive for myalgias. Negative for arthralgias.  Skin: Negative for color change and wound.  Neurological: Negative for dizziness and weakness.  Psychiatric/Behavioral: Positive for confusion. Negative for behavioral problems and agitation.    Past Medical History  Diagnosis Date  . Cancer   . Stroke   . Diabetes mellitus   . Dementia   . Hypertension   . Hyperlipidemia   . Depression   . GERD (gastroesophageal reflux disease)    No past surgical history on file. Social History:   reports that she quit smoking about 35 years ago. Her smoking use included Cigarettes. She smoked 0.00 packs per day. She does not have any smokeless tobacco history on file. She reports that she does not drink  alcohol. Her drug history is not on file.  No family history on file.  Medications: Patient's Medications  New Prescriptions   No medications on file  Previous Medications   ACETAMINOPHEN (TYLENOL) 325 MG TABLET    Take 650 mg by mouth every 4 (four) hours as needed. For pain   ASPIRIN EC 325 MG TABLET    Take 325 mg by mouth daily.   BARRIER CREAM (NON-SPECIFIED) CREA    Apply 1 application topically 3 (three) times daily as needed (once a shift and after each incontinenet episode).   FERROUS SULFATE 325 (65 FE) MG TABLET    Take 325 mg by mouth daily with breakfast.    HYDRALAZINE (APRESOLINE) 50 MG TABLET    Take 50 mg by mouth every 6 (six) hours.   LISINOPRIL (PRINIVIL,ZESTRIL) 30 MG TABLET    Take 30 mg by mouth daily.   METOPROLOL (LOPRESSOR) 50 MG TABLET    Take 50 mg by mouth 2 (two) times daily.   MULTIPLE VITAMIN (MULTIVITAMIN WITH MINERALS) TABS    Take 1 tablet by mouth daily.   NITROGLYCERIN (NITROSTAT) 0.4 MG SL TABLET    Place 0.4 mg under the tongue every 5 (five) minutes as needed for chest pain.   OMEPRAZOLE (PRILOSEC) 20 MG CAPSULE    Take 20 mg by mouth daily before supper.   POLYETHYLENE GLYCOL (MIRALAX / GLYCOLAX) PACKET    Take 17 g by mouth daily.   PROTEIN SUPPLEMENT (RESOURCE BENEPROTEIN) POWD    Take 1 scoop by mouth 3 (three) times daily with meals. In pudding  SENNA-DOCUSATE (SENOKOT-S) 8.6-50 MG PER TABLET    Take 1 tablet by mouth daily. Hold for loose stools  Modified Medications   No medications on file  Discontinued Medications   DIVALPROEX (DEPAKOTE) 125 MG DR TABLET    Take 125 mg by mouth at bedtime. For mood disorder   SACCHAROMYCES BOULARDII (FLORASTOR) 250 MG CAPSULE    Take 250 mg by mouth 2 (two) times daily. For 7 days starting on 11/28/13     Physical Exam: Filed Vitals:   02/27/14 1535  BP: 130/86  Pulse: 83  Temp: 97.5 F (36.4 C)  Resp: 18  Weight: 116 lb (52.617 kg)    Physical Exam  HENT:  Head: Normocephalic and atraumatic.   Mouth/Throat: Oropharynx is clear and moist. No oropharyngeal exudate.  Eyes: EOM are normal. Pupils are equal, round, and reactive to light.  Neck: Neck supple.  Cardiovascular: Normal rate, regular rhythm and normal heart sounds.   Pulmonary/Chest: Effort normal and breath sounds normal.  Abdominal: Soft. Bowel sounds are normal.  Musculoskeletal: She exhibits no edema or tenderness.  Neurological: She is alert.  Skin: Skin is warm and dry.  Psychiatric: She has a normal mood and affect.  Vitals reviewed.   Labs reviewed: Basic Metabolic Panel:  Recent Labs  10/25/13 12/02/13 1300 12/03/13 0432  NA 142 140 138  K 4.0 4.3 4.6  CL  --  104 105  CO2  --  24 24  GLUCOSE  --  94 111*  BUN 26* 23 18  CREATININE 1.1 1.01 0.82  CALCIUM  --  9.4 9.1  MG  --  2.1  --   PHOS  --  3.1  --    Liver Function Tests:  Recent Labs  12/02/13 1300  AST 16  ALT 10  ALKPHOS 73  BILITOT 0.2*  PROT 6.9  ALBUMIN 2.9*   No results for input(s): LIPASE, AMYLASE in the last 8760 hours. No results for input(s): AMMONIA in the last 8760 hours. CBC:  Recent Labs  10/25/13 12/02/13 1300 12/03/13 0432  WBC 4.5 4.1 3.8*  NEUTROABS  --  2.3  --   HGB 8.5* 11.1* 10.6*  HCT 27* 34.4* 32.5*  MCV  --  88.0 87.1  PLT 171 222 229   TSH: No results for input(s): TSH in the last 8760 hours. A1C: Lab Results  Component Value Date   HGBA1C 5.9* 12/03/2013   Lipid Panel: No results for input(s): CHOL, HDL, LDLCALC, TRIG, CHOLHDL, LDLDIRECT in the last 8760 hours.    Assessment/Plan 1. Senile dementia, uncomplicated -dementia remains stable without acute changes in cognitive or functional status.   2. Cerebral infarction due to occlusion of other cerebral artery -late effect, conts on ASA daily   3. Iron deficiency anemia hgb remains stable, conts on iron Will follow up cbc  4. Essential hypertension Blood pressure controlled on hydralazine, lisinopril, and lopressor  Will  follow up bmp 5. Gastroesophageal reflux disease, esophagitis presence not specified Symptoms stable on prilosec   6. Constipation, unspecified constipation type Will have staff cont to monitor for constipation, conts on miralax and senna daily

## 2014-05-08 ENCOUNTER — Encounter: Payer: Self-pay | Admitting: Internal Medicine

## 2014-05-08 ENCOUNTER — Non-Acute Institutional Stay (SKILLED_NURSING_FACILITY): Payer: Medicare Other | Admitting: Internal Medicine

## 2014-05-08 DIAGNOSIS — F0390 Unspecified dementia without behavioral disturbance: Secondary | ICD-10-CM

## 2014-05-08 DIAGNOSIS — Z8673 Personal history of transient ischemic attack (TIA), and cerebral infarction without residual deficits: Secondary | ICD-10-CM

## 2014-05-08 DIAGNOSIS — N189 Chronic kidney disease, unspecified: Secondary | ICD-10-CM

## 2014-05-08 DIAGNOSIS — D631 Anemia in chronic kidney disease: Secondary | ICD-10-CM

## 2014-05-08 DIAGNOSIS — I10 Essential (primary) hypertension: Secondary | ICD-10-CM

## 2014-05-08 NOTE — Progress Notes (Signed)
Patient ID: Amy Martin, female   DOB: [birth year], 81 y.o.   MRN: [id number]   this is a routine visit.  Level care skilled.  [facility].   Chief Complaint  Patient presents with  . Medical Management of Chronic Issues    HPI:  Patient is a 81 y.o. female seen today at [facility] for routine follow up on chronic conditions. Pt with a pmh of CVA, DM, dementia, HTN, hyperlipidemia, anemia, GERD, constipation. Pt has been in her usual state of health since last visit.  Patient continues to ambulate about facility in her wheelchair she has no complaints this evening.  Review of Systems: limited due to dementia Review of Systems  Constitutional: Negative for activity change, appetite change, fatigue and unexpected weight change.  HENT: Negative for congestion and hearing loss.   Eyes: Negative.   Respiratory: Negative for cough and shortness of breath.   Cardiovascular: Negative for chest pain, palpitations and leg swelling.  Gastrointestinal:. Negative for abdominal pain and diarrhea or constipation.  Genitourinary: Negative for dysuria and difficulty urinating.  Musculoskeletal: Positive for myalgias. Negative for arthralgias.  Skin: Negative for color change and wound.  Neurological: Negative for dizziness and weakness.  Psychiatric/Behavioral: Positive for confusion. Negative for behavioral problems and agitation.    Past Medical History  Diagnosis Date  . Cancer   . Stroke   . Diabetes mellitus   . Dementia   . Hypertension   . Hyperlipidemia   . Depression   . GERD (gastroesophageal reflux disease)    No past surgical history on file. Social History:   reports that she quit smoking about 35 years ago. Her smoking use included Cigarettes. She smoked 0.00 packs per day. She does not have any smokeless tobacco history on file. She reports that she does not drink alcohol. Her drug history is not on file.  No family history on  file.  Medications: Patient's Medications  New Prescriptions   No medications on file  Previous Medications   ACETAMINOPHEN (TYLENOL) 325 MG TABLET    Take 650 mg by mouth every 4 (four) hours as needed. For pain   ASPIRIN EC 325 MG TABLET    Take 325 mg by mouth daily.   BARRIER CREAM (NON-SPECIFIED) CREA    Apply 1 application topically 3 (three) times daily as needed (once a shift and after each incontinenet episode).   FERROUS SULFATE 325 (65 FE) MG TABLET    Take 325 mg by mouth daily with breakfast.    HYDRALAZINE (APRESOLINE) 50 MG TABLET    Take 50 mg by mouth every 6 (six) hours.   LISINOPRIL (PRINIVIL,ZESTRIL) 30 MG TABLET    Take 30 mg by mouth daily.   METOPROLOL (LOPRESSOR) 50 MG TABLET    Take 50 mg by mouth 2 (two) times daily.   MULTIPLE VITAMIN (MULTIVITAMIN WITH MINERALS) TABS    Take 1 tablet by mouth daily.   NITROGLYCERIN (NITROSTAT) 0.4 MG SL TABLET    Place 0.4 mg under the tongue every 5 (five) minutes as needed for chest pain.   OMEPRAZOLE (PRILOSEC) 20 MG CAPSULE    Take 20 mg by mouth daily before supper.   POLYETHYLENE GLYCOL (MIRALAX / GLYCOLAX) PACKET    Take 17 g by mouth daily.   PROTEIN SUPPLEMENT (RESOURCE BENEPROTEIN) POWD    Take 1 scoop by mouth 3 (three) times daily with meals. In pudding   SENNA-DOCUSATE (SENOKOT-S) 8.6-50 MG PER TABLET    Take  1 tablet by mouth daily. Hold for loose stools  Modified Medications   No medications on file  Discontinued Medications   DIVALPROEX (DEPAKOTE) 125 MG DR TABLET    Take 125 mg by mouth at bedtime. For mood disorder   SACCHAROMYCES BOULARDII (FLORASTOR) 250 MG CAPSULE    Take 250 mg by mouth 2 (two) times daily. For 7 days starting on 11/28/13     Physical Exam:                        Physical Exam  Afebrile pulse 60 respirations 18 blood pressure 125/75-.  In general this is a pleasant elderly female in no distress sitting comfortably in her wheelchair.  Her skin is warm and dry HENT:  Head:  Normocephalic and atraumatic.  Mouth/Throat: Oropharynx is clear and moist. No oropharyngeal exudate.  Eyes: EOM are normal. Pupils are equal, round, and reactive to light.  Neck: Neck supple.  Cardiovascular: Normal rate, regular rhythm and normal heart sounds.   Pulmonary/Chest: Effort normal and breath sounds normal.  Abdominal: Soft. Bowel sounds are normal.  Musculoskeletal: She exhibits no edema or tenderness has baseline arthritic changes in frailty.  Neurological: She is alert to self follow simple verbal commands.  Skin: Skin is warm and dry.  Psychiatric: She has a normal mood and affect.  V.   Labs reviewed:  03/31/2014.  WBC 4.6 hemoglobin 9.2 platelets 178.  03/14/2014.  Sodium 141 potassium 3.8 BUN 27 creatinine 1.0.  12/06/2013.  Liver function tests within normal limits except albumin of 2.6-.  Depakote level XIV.1  Basic Metabolic Panel:  Recent Labs  10/25/13 12/02/13 1300 12/03/13 0432  NA 142 140 138  K 4.0 4.3 4.6  CL  --  104 105  CO2  --  24 24  GLUCOSE  --  94 111*  BUN 26* 23 18  CREATININE 1.1 1.01 0.82  CALCIUM  --  9.4 9.1  MG  --  2.1  --   PHOS  --  3.1  --    Liver Function Tests:  Recent Labs  12/02/13 1300  AST 16  ALT 10  ALKPHOS 73  BILITOT 0.2*  PROT 6.9  ALBUMIN 2.9*   No results for input(s): LIPASE, AMYLASE in the last 8760 hours. No results for input(s): AMMONIA in the last 8760 hours. CBC:  Recent Labs  10/25/13 12/02/13 1300 12/03/13 0432  WBC 4.5 4.1 3.8*  NEUTROABS  --  2.3  --   HGB 8.5* 11.1* 10.6*  HCT 27* 34.4* 32.5*  MCV  --  88.0 87.1  PLT 171 222 229   TSH: No results for input(s): TSH in the last 8760 hours. A1C: Lab Results  Component Value Date   HGBA1C 5.9* 12/03/2013   Lipid Panel: No results for input(s): CHOL, HDL, LDLCALC, TRIG, CHOLHDL, LDLDIRECT in the last 8760 hours.    Assessment/Plan 1. Senile dementia, uncomplicated -dementia remains stable without acute changes in  cognitive or functional status.   2. Cerebral infarction due to occlusion of other cerebral artery -late effect, conts on ASA daily   3. Iron deficiency anemia hgb remains stable, conts on iron Will update  cbc  4. Essential hypertension Blood pressure controlled on hydralazine, lisinopril, and lopressor  Will follow up bmp 5. Gastroesophageal reflux disease, esophagitis presence not specified Symptoms stable on prilosec   6. Constipation, unspecified constipation type Will have staff cont to monitor for constipation, conts on miralax and senna  daily    [phone]

## 2014-05-09 LAB — HEPATIC FUNCTION PANEL
ALT: 7 U/L (ref 7–35)
AST: 10 U/L — AB (ref 13–35)
Alkaline Phosphatase: 53 U/L (ref 25–125)
BILIRUBIN, TOTAL: 0.3 mg/dL

## 2014-05-09 LAB — BASIC METABOLIC PANEL
BUN: 35 mg/dL — AB (ref 4–21)
GLUCOSE: 99 mg/dL
POTASSIUM: 3.9 mmol/L (ref 3.4–5.3)
SODIUM: 144 mmol/L (ref 137–147)

## 2014-07-07 ENCOUNTER — Non-Acute Institutional Stay (SKILLED_NURSING_FACILITY): Payer: Medicare Other | Admitting: Internal Medicine

## 2014-07-07 ENCOUNTER — Encounter: Payer: Self-pay | Admitting: Internal Medicine

## 2014-07-07 DIAGNOSIS — F0390 Unspecified dementia without behavioral disturbance: Secondary | ICD-10-CM

## 2014-07-07 DIAGNOSIS — I10 Essential (primary) hypertension: Secondary | ICD-10-CM

## 2014-07-07 DIAGNOSIS — D509 Iron deficiency anemia, unspecified: Secondary | ICD-10-CM | POA: Diagnosis not present

## 2014-07-07 DIAGNOSIS — I6359 Cerebral infarction due to unspecified occlusion or stenosis of other cerebral artery: Secondary | ICD-10-CM | POA: Diagnosis not present

## 2014-07-07 DIAGNOSIS — K219 Gastro-esophageal reflux disease without esophagitis: Secondary | ICD-10-CM | POA: Diagnosis not present

## 2014-07-07 NOTE — Progress Notes (Signed)
MRN: [id number] Name: Amy Martin  Sex: female Age: 81 y.o. DOB: 1920-01-30  [place] #: [facility] Facility/Room:305W Level Of Care: SNF Provider: Inocencio Homes D Emergency Contacts: Extended Emergency Contact Information Primary Emergency Contact: Grieser,[name] Address: [address]          [place], [place] [address] Johnnette Litter of [place] Phone: [phone] Mobile Phone: [phone] Relation: Son  Code Status:FULL   Allergies: Review of patient's allergies indicates no known allergies.  Chief Complaint  Patient presents with  . Medical Management of Chronic Issues    HPI: Patient is 81 y.o. female who is being seen for routine issues.  Past Medical History  Diagnosis Date  . Cancer   . Stroke   . Diabetes mellitus   . Dementia   . Hypertension   . Hyperlipidemia   . Depression   . GERD (gastroesophageal reflux disease)     History reviewed. No pertinent past surgical history.    Medication List       This list is accurate as of: 07/07/14 11:59 PM.  Always use your most recent med list.               acetaminophen 325 MG tablet  Commonly known as:  TYLENOL  Take 650 mg by mouth every 4 (four) hours as needed. For pain     aspirin EC 325 MG tablet  Take 325 mg by mouth daily.     ferrous sulfate 325 (65 FE) MG tablet  Take 325 mg by mouth daily with breakfast.     hydrALAZINE 50 MG tablet  Commonly known as:  APRESOLINE  Take 50 mg by mouth every 6 (six) hours.     lisinopril 30 MG tablet  Commonly known as:  PRINIVIL,ZESTRIL  Take 30 mg by mouth daily.     metoprolol 50 MG tablet  Commonly known as:  LOPRESSOR  Take 50 mg by mouth 2 (two) times daily.     multivitamin with minerals Tabs tablet  Take 1 tablet by mouth daily.     omeprazole 20 MG capsule  Commonly known as:  PRILOSEC  Take 20 mg by mouth daily before supper.     polyethylene glycol packet  Commonly known as:  MIRALAX / GLYCOLAX  Take 17 g by mouth daily.     protein supplement Powd  Take 1 scoop by mouth 3 (three) times daily with meals. In pudding     senna-docusate 8.6-50 MG per tablet  Commonly known as:  Senokot-S  Take 1 tablet by mouth daily. Hold for loose stools        No orders of the defined types were placed in this encounter.    Immunization History  Administered Date(s) Administered  . PPD Test 01/12/2012  . Pneumococcal Polysaccharide-23 12/04/2013    History  Substance Use Topics  . Smoking status: Former Smoker    Types: Cigarettes    Quit date: 01/04/1979  . Smokeless tobacco: Not on file  . Alcohol Use: No    Review of Systems  DATA OBTAINED: from nurse, medical record GENERAL:  no fevers, fatigue, appetite changes SKIN: No itching, rash HEENT: No complaint RESPIRATORY: No cough, wheezing, SOB CARDIAC: No chest pain, palpitations, lower extremity edema  GI: No abdominal pain, No N/V/D or constipation, No heartburn or reflux  GU: No dysuria, frequency or urgency, or incontinence  MUSCULOSKELETAL: No unrelieved bone/joint pain NEUROLOGIC: No headache, dizziness  PSYCHIATRIC: No overt anxiety or sadness  Filed Vitals:  07/09/14 2206  BP: 176/86  Pulse: 60  Temp: 97.9 F (36.6 C)  Resp: 20    Physical Exam  GENERAL APPEARANCE: Alert, BF No acute distress  SKIN: No diaphoresis rash HEENT: Unremarkable RESPIRATORY: Breathing is even, unlabored. Lung sounds are clear   CARDIOVASCULAR: Heart RRR no murmurs, rubs or gallops. No peripheral edema  GASTROINTESTINAL: Abdomen is soft, non-tender, not distended w/ normal bowel sounds.  GENITOURINARY: Bladder non tender, not distended  MUSCULOSKELETAL: No abnormal joints  NEUROLOGIC: Cranial nerves 2-12 grossly intact, L side weakness PSYCHIATRIC: dementia, no behavioral issues  Patient Active Problem List   Diagnosis Date Noted  . History of stroke 05/08/2014  . Constipation 02/27/2014  . Thrush 12/11/2013  . Protein-calorie malnutrition, severe  12/04/2013  . Metabolic encephalopathy 54/65/0354  . Seizure 12/02/2013  . Esophageal reflux 07/22/2013  . Chronic kidney disease 01/14/2013  . Anemia 11/02/2012  . Hemorrhoid 11/02/2012  . Unspecified late effects of cerebrovascular disease 08/13/2012  . Senile dementia, uncomplicated 65/68/1275  . Iron deficiency anemia 08/13/2012  . HTN (hypertension) 01/06/2012  . CVA (cerebral infarction) 01/04/2012    CBC    Component Value Date/Time   WBC 3.8* 12/03/2013 0432   WBC 4.5 10/25/2013   RBC 3.73* 12/03/2013 0432   HGB 10.6* 12/03/2013 0432   HCT 32.5* 12/03/2013 0432   PLT 229 12/03/2013 0432   MCV 87.1 12/03/2013 0432   LYMPHSABS 1.2 12/02/2013 1300   MONOABS 0.4 12/02/2013 1300   EOSABS 0.2 12/02/2013 1300   BASOSABS 0.0 12/02/2013 1300    CMP     Component Value Date/Time   NA 138 12/03/2013 0432   NA 142 10/25/2013   K 4.6 12/03/2013 0432   CL 105 12/03/2013 0432   CO2 24 12/03/2013 0432   GLUCOSE 111* 12/03/2013 0432   BUN 18 12/03/2013 0432   BUN 26* 10/25/2013   CREATININE 0.82 12/03/2013 0432   CREATININE 1.1 10/25/2013   CALCIUM 9.1 12/03/2013 0432   PROT 6.9 12/02/2013 1300   ALBUMIN 2.9* 12/02/2013 1300   AST 16 12/02/2013 1300   ALT 10 12/02/2013 1300   ALKPHOS 73 12/02/2013 1300   BILITOT 0.2* 12/02/2013 1300   GFRNONAA 60* 12/03/2013 0432   GFRAA 70* 12/03/2013 0432    Assessment and Plan  HTN (hypertension) BP is not well controlled today but has been on prior visits;Plan- no change in BP meds today but will continue to monitor   CVA (cerebral infarction) Chronic and stable with no decline or change in L side weakness;Plan-continie to monitor   Senile dementia, uncomplicated Chronic and stable and without decline;pt's depakote was weaned then d/c and pt has remained stable;Plan-cont to monitor   Esophageal reflux No signs or sx of reflux;Plan- continue omeprazole 20 mg daily   Iron deficiency anemia In 05/2014 HB -9.7/HCT- 29.7  which is a slight decline since prior of 6 months ago;Plan -continue iron daily and will monitor     Hennie Duos, MD

## 2014-07-09 ENCOUNTER — Encounter: Payer: Self-pay | Admitting: Internal Medicine

## 2014-07-09 NOTE — Assessment & Plan Note (Signed)
Chronic and stable with no decline or change in L side weakness;Plan-continie to monitor

## 2014-07-09 NOTE — Assessment & Plan Note (Signed)
In 05/2014 HB -9.7/HCT- 29.7 which is a slight decline since prior of 6 months ago;Plan -continue iron daily and will monitor

## 2014-07-09 NOTE — Assessment & Plan Note (Signed)
Chronic and stable and without decline;pt's depakote was weaned then d/c and pt has remained stable;Plan-cont to monitor

## 2014-07-09 NOTE — Assessment & Plan Note (Signed)
No signs or sx of reflux;Plan- continue omeprazole 20 mg daily

## 2014-07-09 NOTE — Assessment & Plan Note (Signed)
BP is not well controlled today but has been on prior visits;Plan- no change in BP meds today but will continue to monitor

## 2014-08-08 ENCOUNTER — Encounter: Payer: Self-pay | Admitting: *Deleted

## 2014-08-17 ENCOUNTER — Non-Acute Institutional Stay (SKILLED_NURSING_FACILITY): Payer: Medicare Other | Admitting: Internal Medicine

## 2014-08-17 DIAGNOSIS — F0390 Unspecified dementia without behavioral disturbance: Secondary | ICD-10-CM | POA: Diagnosis not present

## 2014-08-17 DIAGNOSIS — I6990 Unspecified sequelae of unspecified cerebrovascular disease: Secondary | ICD-10-CM | POA: Diagnosis not present

## 2014-08-17 DIAGNOSIS — N189 Chronic kidney disease, unspecified: Secondary | ICD-10-CM

## 2014-08-17 DIAGNOSIS — I10 Essential (primary) hypertension: Secondary | ICD-10-CM

## 2014-08-17 NOTE — Progress Notes (Signed)
Patient ID: Amy Martin, female   DOB: 1919-12-20, 81 y.o.   MRN: [id number] MRN: [id number] Name: Amy Martin  Sex: female Age: 81 y.o. DOB: [birth year]  [place] #: [facility] Facility/Room:305W Level Of Care: SNF Provider: Wille Celeste Emergency Contacts: Extended Emergency Contact Information Primary Emergency Contact: Tiner,[name] Address: [address]          [place], [place] [address] Johnnette Litter of [place] Phone: [phone] Mobile Phone: [phone] Relation: Son  Code Status:FULL   Allergies: Review of patient's allergies indicates no known allergies.  Chief Complaint  Patient presents with  . Medical Management of Chronic Issues    HPI: Patient is 81 y.o. female who is being seen for routine issues including history of hypertension-CVA-dementia-GERD-anemia.  Her son also thought she's had some increased confusion earlier this week and a urine culture is pending.  Metabolic panel. was obtained as well which is baseline except for potassium slightly low at 3.4 this is being supplemented.  Her vital signs are stable I do note she has elevated blood pressures at times I see listed ones 160/58-also see 189/86-I did check it manually this evening and got 140/73  She is on hydralazine 50 mg every 6 hours-metoprolol 50 mg twice a day and lisinopril 30 mg a day-according to her nursing tech her blood pressure does come down somewhat after she receives her hydralazine in the evening  Past Medical History  Diagnosis Date  . Cancer   . Stroke   . Diabetes mellitus   . Dementia   . Hypertension   . Hyperlipidemia   . Depression   . GERD (gastroesophageal reflux disease)     No past surgical history on file.    Medication List       This list is accurate as of: 08/17/14  4:31 PM.  Always use your most recent med list.               acetaminophen 325 MG tablet  Commonly known as:  TYLENOL  Take 650 mg by mouth every 4 (four) hours as needed. For pain      aspirin EC 325 MG tablet  Take 325 mg by mouth daily. For CVA     ferrous sulfate 325 (65 FE) MG tablet  Take 325 mg by mouth daily with breakfast. For iron deficiency     hydrALAZINE 50 MG tablet  Commonly known as:  APRESOLINE  Take 50 mg by mouth every 6 (six) hours. For HTN     lisinopril 30 MG tablet  Commonly known as:  PRINIVIL,ZESTRIL  Take 30 mg by mouth daily. For HTN     metoprolol 50 MG tablet  Commonly known as:  LOPRESSOR  Take 50 mg by mouth 2 (two) times daily.     multivitamin with minerals Tabs tablet  Take 1 tablet by mouth daily.     omeprazole 20 MG capsule  Commonly known as:  PRILOSEC  Take 20 mg by mouth daily before supper. For reflux     polyethylene glycol packet  Commonly known as:  MIRALAX / GLYCOLAX  Take 17 g by mouth daily. For constipation     potassium chloride 20 MEQ packet  Commonly known as:  KLOR-CON  Take 20 mEq by mouth daily.     protein supplement Powd  Take 1 scoop by mouth 3 (three) times daily with meals. In pudding     senna-docusate 8.6-50 MG per tablet  Commonly known as:  Senokot-S  Take  1 tablet by mouth daily. Hold for loose stools        Meds ordered this encounter  Medications  . potassium chloride (KLOR-CON) 20 MEQ packet    Sig: Take 20 mEq by mouth daily.    Immunization History  Administered Date(s) Administered  . PPD Test 01/12/2012  . Pneumococcal Polysaccharide-23 12/04/2013    History  Substance Use Topics  . Smoking status: Former Smoker    Types: Cigarettes    Quit date: 01/04/1979  . Smokeless tobacco: Not on file  . Alcohol Use: No    Review of Systems  DATA OBTAINED: from nurse, medical record GENERAL:  no fevers has somewhat increased fatigue, no appetite changes some increased confusion earlier this week SKIN: No itching, rash HEENT: No complaint RESPIRATORY: No cough, wheezing, SOB CARDIAC: No chest pain, palpitations, lower extremity edema  GI: No abdominal pain, No  N/V/D or constipation, No heartburn or reflux  GU: No dysuria, frequency or urgency, or incontinence  MUSCULOSKELETAL: No unrelieved bone/joint pain NEUROLOGIC: No headache, dizziness  PSYCHIATRIC: No overt anxiety or sadness    Physical Exam She has been afebrile pulse on exam 56 respirations 18 blood pressure 140 GENERAL APPEARANCE: Alert, BF No acute distress issue reevaluated in wheelchair and later when lying in bed SKIN: No diaphoresis rash HEENT: Unremarkable this membranes appear moist RESPIRATORY: Breathing is even, unlabored. Lung sounds are clear but poor respiratory effort--  CARDIOVASCULAR: Heart RRR no murmurs, rubs or gallops. No peripheral edema continues to have mild bradycardia at times  GASTROINTESTINAL: Abdomen is soft, non-tender, not distended w/ normal bowel sounds.  GENITOURINARY: Bladder non tender, not distended  MUSCULOSKELETAL: No abnormal joints --general frailty NEUROLOGIC: Cranial nerves 2-12 grossly intact, L side weakness--this is baseline with previous exams PSYCHIATRIC: dementia, no behavioral issues she does seem somewhat sleepy but easily arousable responds appropriately and at baseline  Patient Active Problem List   Diagnosis Date Noted  . History of stroke 05/08/2014  . Constipation 02/27/2014  . Thrush 12/11/2013  . Protein-calorie malnutrition, severe 12/04/2013  . Metabolic encephalopathy 22/63/3354  . Seizure 12/02/2013  . Esophageal reflux 07/22/2013  . Chronic kidney disease 01/14/2013  . Anemia 11/02/2012  . Hemorrhoid 11/02/2012  . 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 Senile dementia, uncomplicated 56/25/6389  . Iron deficiency anemia 08/13/2012  . HTN (hypertension) 01/06/2012  . CVA (cerebral infarction) 01/04/2012     Labs.  08/14/2014.  Sodium 145 potassium 3.4 BUN 25 creatinine 1.10  05/09/2014.  WBC 5.1 hemoglobin 9.7 platelets 202.   CBC    Component Value Date/Time   WBC 3.8* 12/03/2013 0432    WBC 4.5 10/25/2013   RBC 3.73* 12/03/2013 0432   HGB 10.6* 12/03/2013 0432   HCT 32.5* 12/03/2013 0432   PLT 229 12/03/2013 0432   MCV 87.1 12/03/2013 0432   LYMPHSABS 1.2 12/02/2013 1300   MONOABS 0.4 12/02/2013 1300   EOSABS 0.2 12/02/2013 1300   BASOSABS 0.0 12/02/2013 1300    CMP     Component Value Date/Time   NA 144 05/09/2014   NA 138 12/03/2013 0432   K 3.9 05/09/2014   CL 105 12/03/2013 0432   CO2 24 12/03/2013 0432   GLUCOSE 111* 12/03/2013 0432   BUN 35* 05/09/2014   BUN 18 12/03/2013 0432   CREATININE 0.82 12/03/2013 0432   CREATININE 1.1 10/25/2013   CALCIUM 9.1 12/03/2013 0432   PROT 6.9 12/02/2013 1300   ALBUMIN 2.9* 12/02/2013 1300   AST 10* 05/09/2014  ALT 7 05/09/2014   ALKPHOS 53 05/09/2014   BILITOT 0.2* 12/02/2013 1300   GFRNONAA 60* 12/03/2013 0432   GFRAA 70* 12/03/2013 0432    Assessment and Plan  #[id number] her systolics are somewhat consistently elevated-she is on metoprolol 50 mg twice a day-lisinopril 30 mg a day-and hydralazine 50 mg every 6 hours-will increase lisinopril to 40 mg a day and monitor-- Also  hold metoprolol for pulse less than 55.  #2-history CVA late effect-this appears to be at baseline she is on aspirin for anticoagulation.  #3 history of senile dementia-this appears to be at baseline as well she appears to be doing well with supportive care she had been on Depakote which was weaned off this.  #4 history of iron deficiency anemia last hemoglobin was 9.7 back in February 2016-will update this.  #5-history of GERD she is on a proton pump inhibitor this appears to be stable.  # apparently some history of confusion-a urine culture is pending-. She has had periods of sleepiness today according in nursing staff although she's been roughly at her baseline according to her nursing tech this evening-nursing tech feels that she's just been wheeling about the facility so much last few days that she's gotten tired  and catching up on her rest-.  Again a urine culture is pending will update a CBC with differential and CMP -TSH tomorrow-also will check a chest x-ray.  Also monitor vital signs pulse ox every shift for 72 hours  #7-mild hypokalemia she is getting potassium now will recheck this tomorrow-also check this next week to ensure stability  . #8-chronic kidney disease this appears to be relatively baseline most recent creatinine of 1.1  with a BUN of 25    CPT 99310-of note greater than 40 minutes spent assessing patient-discussing her status with nursing staff extensively-reviewing her chart-and coordinating and formulating a plan of care for numerous diagnoses-of note greater than 50% of time spent coordinating plan of care    Fany Cavanaugh C,

## 2014-09-21 ENCOUNTER — Non-Acute Institutional Stay (SKILLED_NURSING_FACILITY): Payer: Medicare Other | Admitting: Internal Medicine

## 2014-09-21 ENCOUNTER — Encounter: Payer: Self-pay | Admitting: Internal Medicine

## 2014-09-21 DIAGNOSIS — I10 Essential (primary) hypertension: Secondary | ICD-10-CM | POA: Diagnosis not present

## 2014-09-21 DIAGNOSIS — N189 Chronic kidney disease, unspecified: Secondary | ICD-10-CM | POA: Diagnosis not present

## 2014-09-21 DIAGNOSIS — Z8673 Personal history of transient ischemic attack (TIA), and cerebral infarction without residual deficits: Secondary | ICD-10-CM | POA: Diagnosis not present

## 2014-09-21 DIAGNOSIS — D508 Other iron deficiency anemias: Secondary | ICD-10-CM

## 2014-09-21 DIAGNOSIS — F0390 Unspecified dementia without behavioral disturbance: Secondary | ICD-10-CM | POA: Diagnosis not present

## 2014-09-21 NOTE — Progress Notes (Signed)
Patient ID: Amy Martin, female   DOB: [birth year], 81 y.o.   MRN: [id number]  MRN: [id number] Name: Amy Martin  Sex: female Age: 81 y.o. DOB: 1919-08-11  [facility] #: [facility] Facility/Room:305W Level Of Care: SNF Provider: Wille Celeste Emergency Contacts: Extended Emergency Contact Information Primary Emergency Contact: Amy Martin Address: [address]          [place], [place] [address] Johnnette Litter of [place] Phone: [phone] Mobile Phone: [phone] Relation: Son  Code Status:FULL   Allergies: Review of patient's allergies indicates no known allergies.  Chief Complaint  Patient presents with  . Medical Management of Chronic Issues    HPI: Patient is 81 y.o. female who is being seen for routine issues including history of hypertension-CVA-dementia-GERD-anemia. Nursing staff does not report any recent acute issues-family was concerned about some possible thrush on her tongue.  It appears she has fairly minimal film on tongue today  She does have a history of hypertension her lisinopril was recently increased to 40 mg a day she also is on hydralazine 50 mg every 6 hours metoprolol 50 mg twice a day.  I do note she has frequent pulses it appears in the 50s and some 60s-her blood pressure appears to be under better control 131/71-103/50 most recently the highest one I see recently is 143/64 but this does not appear to be consistent.       -   Past Medical History  Diagnosis Date  . Cancer   . Stroke   . Diabetes mellitus   . Dementia   . Hypertension   . Hyperlipidemia   . Depression   . GERD (gastroesophageal reflux disease)     No past surgical history on file.    Medication List       This list is accurate as of: 09/21/14  5:42 PM.  Always use your most recent med list.               acetaminophen 325 MG tablet  Commonly known as:  TYLENOL  Take 650 mg by mouth every 4 (four) hours as needed. For pain     aspirin EC 325 MG tablet   Take 325 mg by mouth daily. For CVA     ferrous sulfate 325 (65 FE) MG tablet  Take 325 mg by mouth daily with breakfast. For iron deficiency     hydrALAZINE 50 MG tablet  Commonly known as:  APRESOLINE  Take 50 mg by mouth every 6 (six) hours. For HTN     lisinopril 30 MG tablet  Commonly known as:  PRINIVIL,ZESTRIL  Take 40 mg by mouth daily. For HTN     metoprolol 50 MG tablet  Commonly known as:  LOPRESSOR  Take 50 mg by mouth 2 (two) times daily.     multivitamin with minerals Tabs tablet  Take 1 tablet by mouth daily.     omeprazole 20 MG capsule  Commonly known as:  PRILOSEC  Take 20 mg by mouth daily before supper. For reflux     polyethylene glycol packet  Commonly known as:  MIRALAX / GLYCOLAX  Take 17 g by mouth daily. For constipation     potassium chloride 20 MEQ packet  Commonly known as:  KLOR-CON  Take 20 mEq by mouth daily.     protein supplement Powd  Take 1 scoop by mouth 3 (three) times daily with meals. In pudding     senna-docusate 8.6-50 MG per tablet  Commonly known as:  Senokot-S  Take 1 tablet by mouth daily. Hold for loose stools        No orders of the defined types were placed in this encounter.    Immunization History  Administered Date(s) Administered  . PPD Test 01/12/2012  . Pneumococcal Polysaccharide-23 12/04/2013    History  Substance Use Topics  . Smoking status: Former Smoker    Types: Cigarettes    Quit date: 01/04/1979  . Smokeless tobacco: Not on file  . Alcohol Use: No    Review of Systems  DATA OBTAINED: from nurse, medical record GENERAL:  no fevers or complaints of discomfort SKIN: No itching, rash HEENT: Possible intermitted thrush? Does not complain of sore throat RESPIRATORY: No cough, wheezing, SOB CARDIAC: No chest pain, palpitations, lower extremity edema  GI: No abdominal pain, No N/V/D or constipation, No heartburn or reflux  GU: No dysuria, frequency or urgency, or incontinence   MUSCULOSKELETAL: No unrelieved bone/joint pain NEUROLOGIC: No headache, dizziness  PSYCHIATRIC: No overt anxiety or sadness    Physical Exam She is afebrile pulse of 50 respirations 18 blood pressure 131/71 GENERAL APPEARANCE: Alert, BF No acute distress sitting comfortably in her wheelchair SKIN: No diaphoresis rash HEENT: Possibly a very minimal film on tongue again this is quite minimal mucous membranes are moist throat is generally clear RESPIRATORY: Breathing is even, unlabored. Lung sounds are clear but poor respiratory effort--  CARDIOVASCULAR: Heart RRR no murmurs, rubs or gallops. No peripheral edema continues to have mild bradycardia at times  GASTROINTESTINAL: Abdomen is soft, non-tender, not distended w/ normal bowel sounds.    MUSCULOSKELETAL: No abnormal joints --general frailty NEUROLOGIC: Cranial nerves 2-12 grossly intact, L side weakness--this is baseline with previous exams PSYCHIATRIC: dementia, no behavioral issues--she is alert and cooperative with exam    Patient Active Problem List   Diagnosis Date Noted  . History of stroke 05/08/2014  . Constipation 02/27/2014  . Thrush 12/11/2013  . Protein-calorie malnutrition, severe 12/04/2013  . Metabolic encephalopathy 88/82/8003  . Seizure 12/02/2013  . Esophageal reflux 07/22/2013  . Chronic kidney disease 01/14/2013  . Anemia 11/02/2012  . Hemorrhoid 11/02/2012  . 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 Senile dementia, uncomplicated 49/17/9150  . Iron deficiency anemia 08/13/2012  . HTN (hypertension) 01/06/2012  . CVA (cerebral infarction) 01/04/2012     Labs.  08/25/2014.  Sodium 141 potassium 4.1 BUN 26 creatinine 1.04.  08/18/2014.  WBC 4.0 hemoglobin 9.8 platelets 183.  [place] 2.7 otherwise liver function tests within normal limits  08/14/2014.  Sodium 145 potassium 3.4 BUN 25 creatinine 1.10  05/09/2014.  WBC 5.1 hemoglobin 9.7 platelets 202.   CBC    Component  Value Date/Time   WBC 3.8* 12/03/2013 0432   WBC 4.5 10/25/2013   RBC 3.73* 12/03/2013 0432   HGB 10.6* 12/03/2013 0432   HCT 32.5* 12/03/2013 0432   PLT 229 12/03/2013 0432   MCV 87.1 12/03/2013 0432   LYMPHSABS 1.2 12/02/2013 1300   MONOABS 0.4 12/02/2013 1300   EOSABS 0.2 12/02/2013 1300   BASOSABS 0.0 12/02/2013 1300    CMP     Component Value Date/Time   NA 144 05/09/2014   NA 138 12/03/2013 0432   K 3.9 05/09/2014   CL 105 12/03/2013 0432   CO2 24 12/03/2013 0432   GLUCOSE 111* 12/03/2013 0432   BUN 35* 05/09/2014   BUN 18 12/03/2013 0432   CREATININE 0.82 12/03/2013 0432   CREATININE 1.1 10/25/2013   CALCIUM 9.1 12/03/2013 0432  PROT 6.9 12/02/2013 1300   ALBUMIN 2.9* 12/02/2013 1300   AST 10* 05/09/2014   ALT 7 05/09/2014   ALKPHOS 53 05/09/2014   BILITOT 0.2* 12/02/2013 1300   GFRNONAA 60* 12/03/2013 0432   GFRAA 70* 12/03/2013 0432    Assessment and Plan  #1-hypertension- This appears to have stabilized a do not see consistent elevations--131/71-103/50 most recently-somewhat more distant blood pressure 143/64 but this does not appear to be consistent-I did get a pulse of 50 on exam- blood pressure appears to be under better control will cautiously reduce the metoprolol to 25 mg twice a day secondary to concerns about bradycardia-it appears she has somewhat consistent readings in the 50s here would like to keep an eye on this with blood pressure and pulse checks twice a day with a log for review next week Continue to hold metoprolol for pulse less than 55  #2-history CVA late effect-this appears to be at baseline she is on aspirin for anticoagulation.  #3 history of senile dementia-this appears to be at baseline as well she appears to be doing well with supportive care she had been on Depakote which was weaned off this.  #4 history of iron deficiency anemia last hemoglobin was 9.8 May--27 2016 this appears to be stable  #5-history of GERD she is on a proton  pump inhibitor this appears to be stable.   #6-possible thrush this appears to be quite mild will order nystatin swab 5 mL 3 times a day for 3 days and monitor  #7-mild hypokalemia --this appears to be stable most recent potassium 08/25/2014 was 4.1--will update this    . #8-chronic kidney disease this appears to be relatively baseline most recent creatinine of 1.04  with a BUN of 26--will update this as well this has been stable for some time   [id number]         Wileen Duncanson C,

## 2014-10-28 IMAGING — CT CT HEAD W/O CM
1 of 2 series · 15 of 30 positions shown, 19 images · non-contrast
Comparison: Brain CT 01/10/2012

CLINICAL DATA: Altered mental status

EXAM:
CT HEAD WITHOUT CONTRAST
TECHNIQUE: Contiguous axial images were obtained from the base of the skull
through the vertex without intravenous contrast.

[Series 3: head 5.0 h30f · axial · 0.43mm/px · z∈[+1371,+1491]mm · 15 of 28 slices shown, 19 images]
[im 2/28  brain]
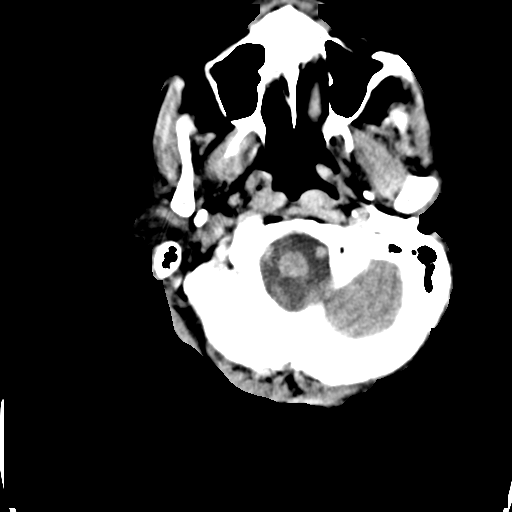
[im 2/28  bone]
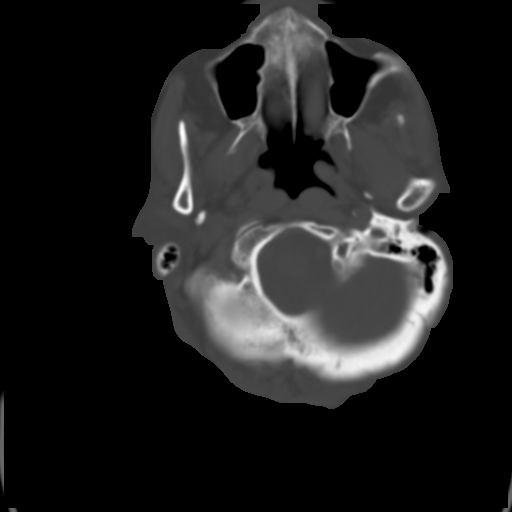
[im 4/28  brain]
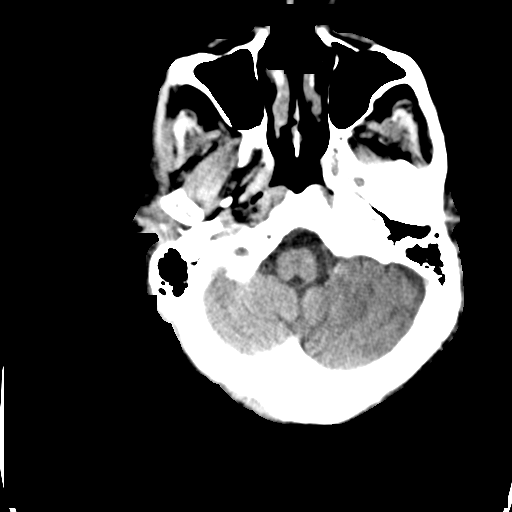
[im 6/28  brain]
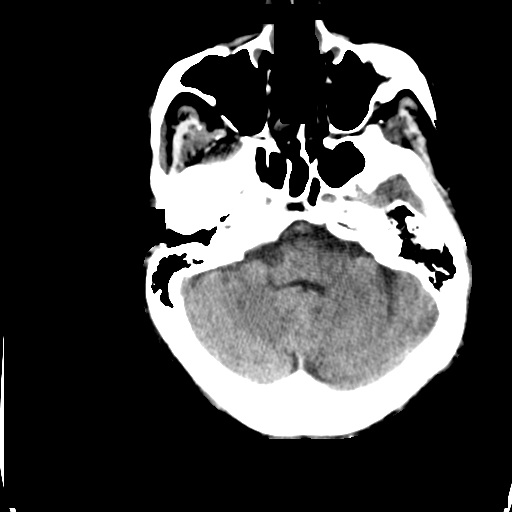
[im 7/28  brain]
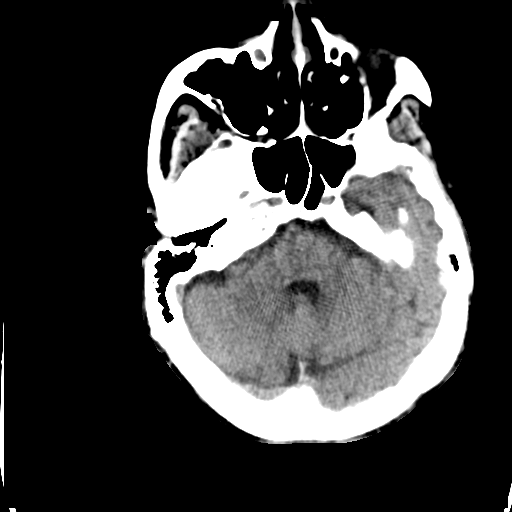
[im 10/28  brain]
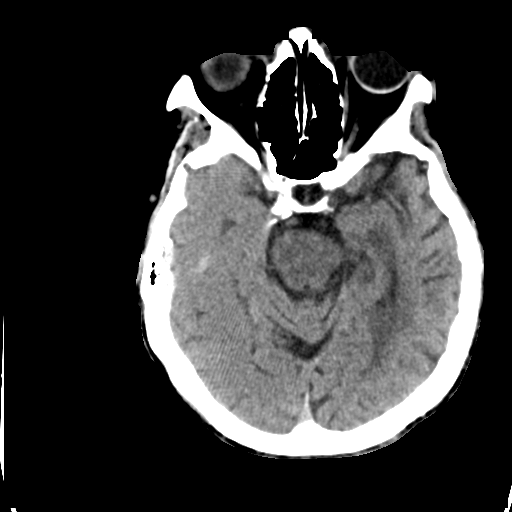
[im 10/28  bone]
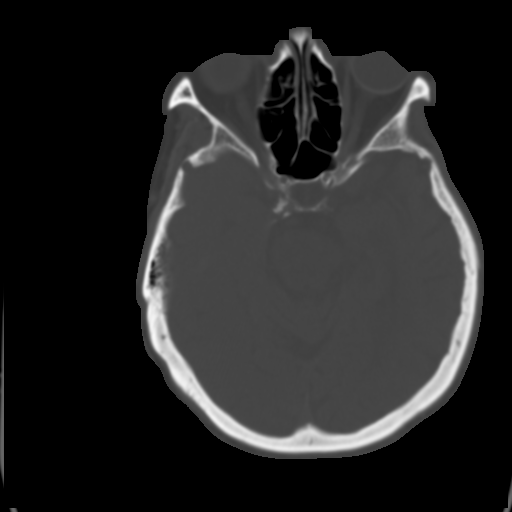
[im 11/28  brain]
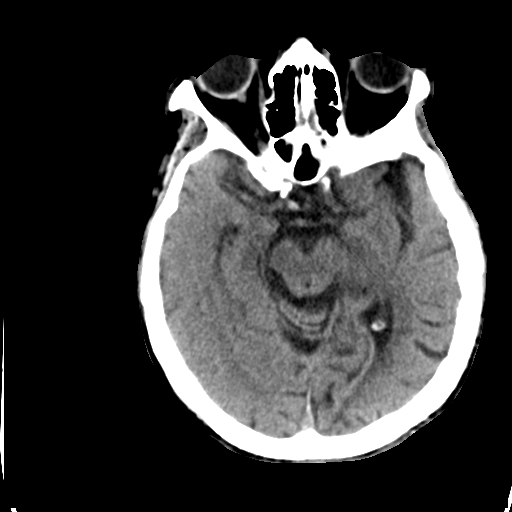
[im 12/28  brain]
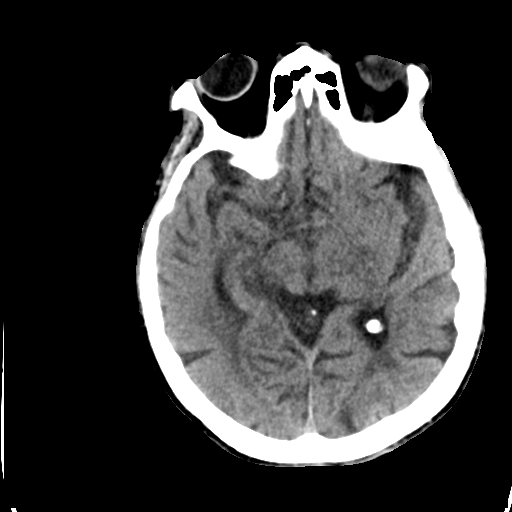
[im 15/28  brain]
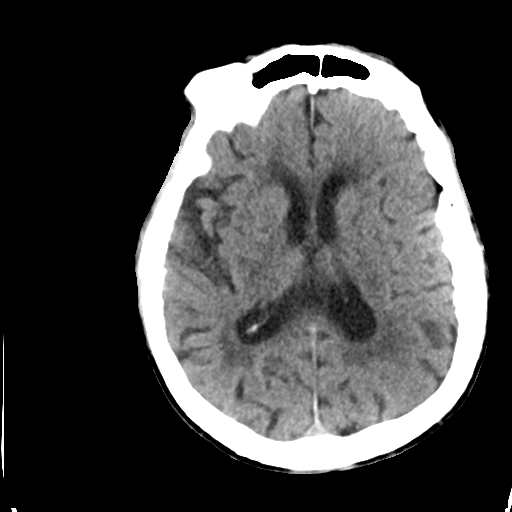
[im 16/28  brain]
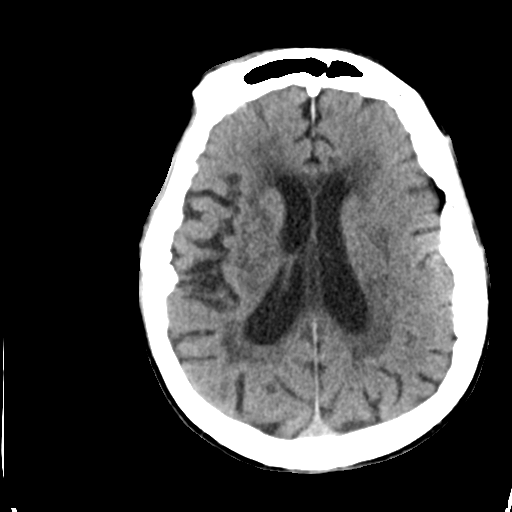
[im 16/28  bone]
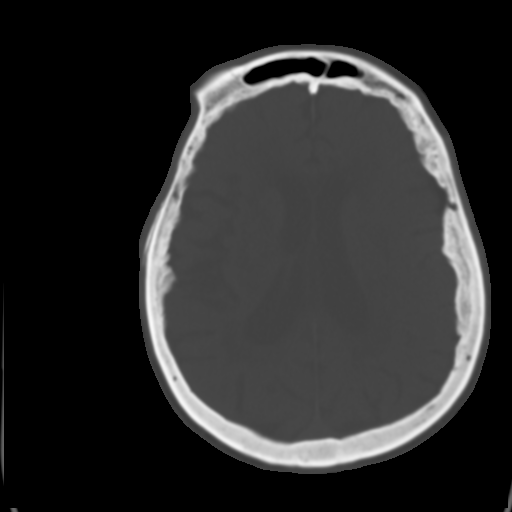
[im 17/28  brain]
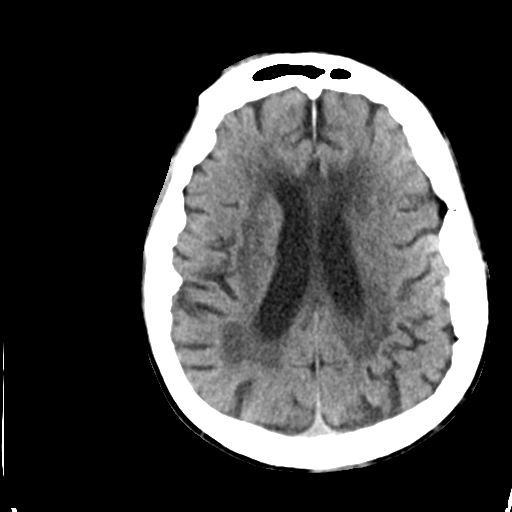
[im 20/28  brain]
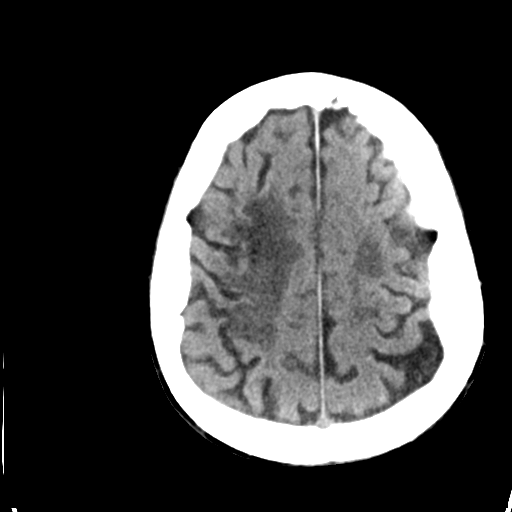
[im 21/28  brain]
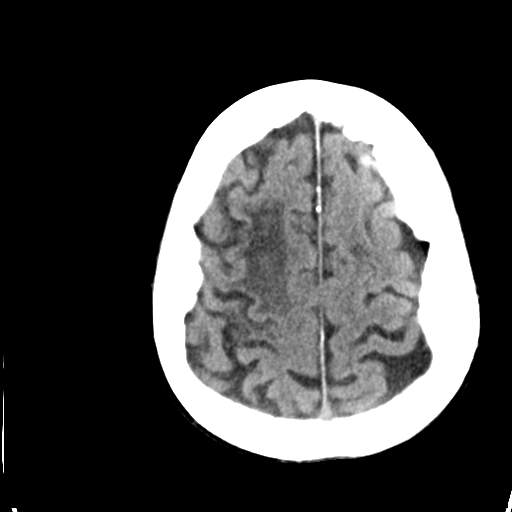
[im 22/28  brain]
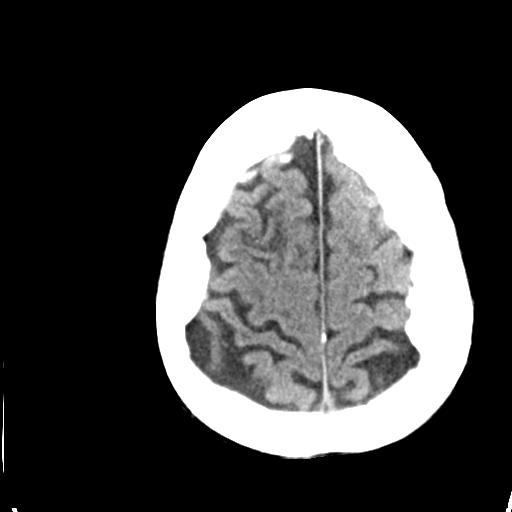
[im 22/28  bone]
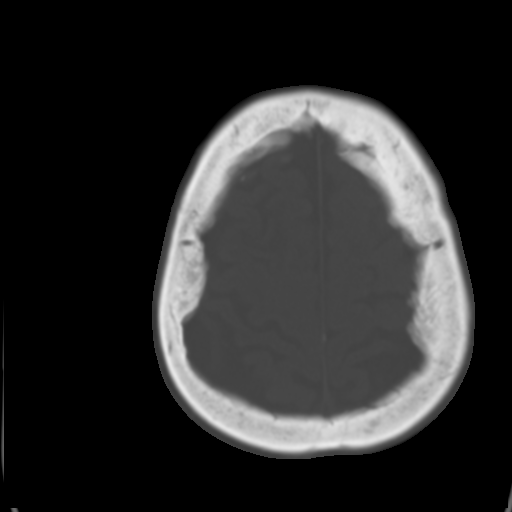
[im 25/28  brain]
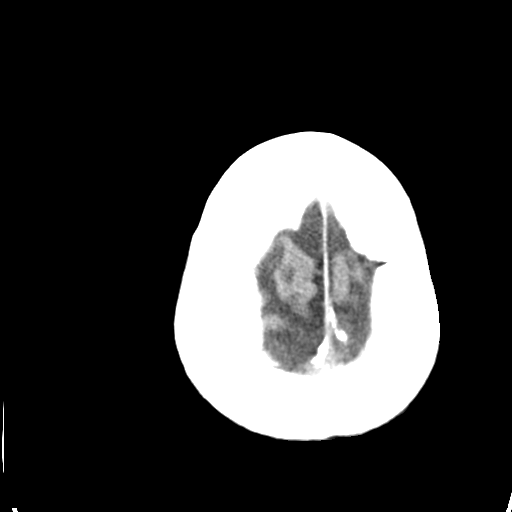
[im 26/28  brain]
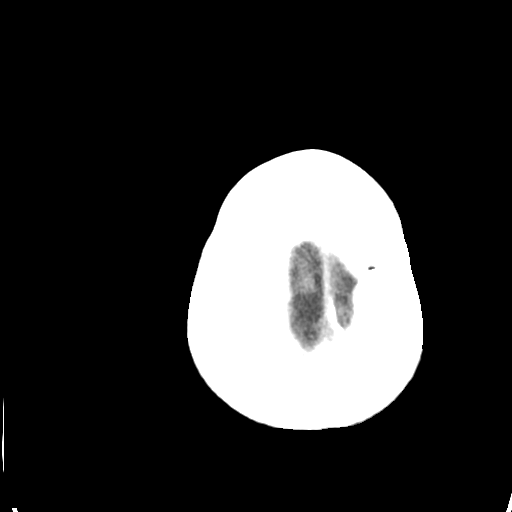

[15 of 30 positions shown; findings below may reference images not displayed]

FINDINGS: Extensive periventricular and subcortical white matter hypodensity
compatible with chronic small vessel ischemic change. More extensive
high right frontal and parietal lobe hypodensity is favored to
represent interval evolution right MCA infarct and chronic small
vessel ischemic change. Patchy hypoattenuation within the right
basal ganglia. No definite evidence for acute cortically based
infarct, mass lesion, mass effect or hemorrhage. Bilateral cataract
surgery. Paranasal sinuses are unremarkable. Mastoid air cells
unremarkable. Calvarium intact.
IMPRESSION: Patchy hypoattenuation within the region of the right basal ganglia
may represent age indeterminate infarct, favored to be chronic.

Extensive chronic small vessel ischemic change. Increased white
matter hypodensity within the high right frontal and parietal lobes
is favored to represent worsening chronic small vessel ischemic
change.

## 2015-01-01 ENCOUNTER — Non-Acute Institutional Stay (SKILLED_NURSING_FACILITY): Payer: Medicare Other | Admitting: Internal Medicine

## 2015-01-01 DIAGNOSIS — I6990 Unspecified sequelae of unspecified cerebrovascular disease: Secondary | ICD-10-CM | POA: Diagnosis not present

## 2015-01-01 DIAGNOSIS — N189 Chronic kidney disease, unspecified: Secondary | ICD-10-CM

## 2015-01-01 DIAGNOSIS — R05 Cough: Secondary | ICD-10-CM | POA: Diagnosis not present

## 2015-01-01 DIAGNOSIS — I10 Essential (primary) hypertension: Secondary | ICD-10-CM

## 2015-01-01 DIAGNOSIS — R059 Cough, unspecified: Secondary | ICD-10-CM

## 2015-01-01 NOTE — Progress Notes (Signed)
Patient ID: Aroura Vasudevan, female   DOB: 1919-12-12, 81 y.o.   MRN: [id number]   MRN: [id number] Name: Sparkles Mcneely  Sex: female Age: 81 y.o. DOB: October 01, 1919  [place] #: [facility] Facility/Room:305W Level Of Care: SNF Provider: Wille Celeste Emergency Contacts: Extended Emergency Contact Information Primary Emergency Contact: Sen,[name] Address: [address]          [place], [place] [address] Johnnette Litter of [place] Phone: [phone] Mobile Phone: [phone] Relation: Son  Code Status:FULL   Allergies: Review of patient's allergies indicates no known allergies.  Chief Complaint  Patient presents with  . Medical Management of Chronic Issues    HPI: Patient is 81 y.o. female who is being seen for routine issues including history of hypertension-CVA-dementia-GERD-anemia. Nursing staff does not report any recent acute issues-apparently yesterday she did have some cold systems some cough but according to her nurse today this is largely resolved   She does have a history of hypertension her lisinopril was recently increased to 40 mg a day she also is on hydralazine 50 mg every 6 hours metoprolol 50 mg twice a day.  Recent blood pressures have been 136/88-129/61-115/55-pulses have been in the 70-80s area       -   Past Medical History  Diagnosis Date  . Cancer   . Stroke   . Diabetes mellitus   . Dementia   . Hypertension   . Hyperlipidemia   . Depression   . GERD (gastroesophageal reflux disease)     No past surgical history on file.    Medication List       This list is accurate as of: 01/01/15 11:59 PM.  Always use your most recent med list.               acetaminophen 325 MG tablet  Commonly known as:  TYLENOL  Take 650 mg by mouth every 4 (four) hours as needed. For pain     aspirin EC 325 MG tablet  Take 325 mg by mouth daily. For CVA     ferrous sulfate 325 (65 FE) MG tablet  Take 325 mg by mouth daily with breakfast. For iron  deficiency     hydrALAZINE 50 MG tablet  Commonly known as:  APRESOLINE  Take 50 mg by mouth every 6 (six) hours. For HTN     lisinopril 30 MG tablet  Commonly known as:  PRINIVIL,ZESTRIL  Take 40 mg by mouth daily. For HTN     metoprolol 50 MG tablet  Commonly known as:  LOPRESSOR  Take 25 mg by mouth 2 (two) times daily.     multivitamin with minerals Tabs tablet  Take 1 tablet by mouth daily.     omeprazole 20 MG capsule  Commonly known as:  PRILOSEC  Take 20 mg by mouth daily before supper. For reflux     polyethylene glycol packet  Commonly known as:  MIRALAX / GLYCOLAX  Take 17 g by mouth daily. For constipation     potassium chloride 20 MEQ packet  Commonly known as:  KLOR-CON  Take 20 mEq by mouth daily.     protein supplement Powd  Take 1 scoop by mouth 3 (three) times daily with meals. In pudding     senna-docusate 8.6-50 MG tablet  Commonly known as:  Senokot-S  Take 1 tablet by mouth daily. Hold for loose stools        No orders of the defined types were placed in this encounter.  Immunization History  Administered Date(s) Administered  . PPD Test 01/12/2012  . Pneumococcal Polysaccharide-23 12/04/2013    Social History  Substance Use Topics  . Smoking status: Former Smoker    Types: Cigarettes    Quit date: 01/04/1979  . Smokeless tobacco: Not on file  . Alcohol Use: No    Review of Systems  DATA OBTAINED: from nursepatient-, medical record GENERAL:  no fevers or complaints of discomfort SKIN: No itching, rash HEENT: Does not complain of a sore throat-her visual changes RESPIRATORY: Apparently had a cough yesterday patient nursing indicate this is better today although maybe still has some residual cough-no shortness of breath no wheezing CARDIAC: No chest pain, palpitations, lower extremity edema  GI: No abdominal pain, No N/V/D or constipation, No heartburn or reflux  GU: No dysuria, frequency or urgency, or incontinence   MUSCULOSKELETAL: No unrelieved bone/joint pain NEUROLOGIC: No headache, dizziness  PSYCHIATRIC: No overt anxiety or sadness    Physical Exam Temperature is 98.6 pulse 84 respirations 18 blood pressure 136/88 GENERAL APPEARANCE: Alert, BF No acute distress sitting comfortably in her wheelchair SKIN: No diaphoresis rash HEENT:  Oropharynx is clear mucous membranes moist RESPIRATORY: Breathing is even, unlabored. Lung sounds are clear but poor respiratory effort--  CARDIOVASCULAR: Heart RRR no murmurs, rubs or gallops. No peripheral edema continues to have mild bradycardia at times  GASTROINTESTINAL: Abdomen is soft, non-tender, not distended w/ normal bowel sounds.    MUSCULOSKELETAL: No abnormal joints --general frailty NEUROLOGIC: Cranial nerves 2-12 grossly intact, L side weakness--this is baseline with previous exams--is able to ambulate facility in wheelchair PSYCHIATRIC: dementia, no behavioral issues--she is alert and cooperative with exam follow simple verbal commands is able to have a short conversation    Patient Active Problem List   Diagnosis Date Noted  . Cough 01/01/2015  . History of stroke 05/08/2014  . Constipation 02/27/2014  . Thrush 12/11/2013  . Protein-calorie malnutrition, severe ([place]) 12/04/2013  . Metabolic encephalopathy 24/23/5361  . Seizure ([place]) 12/02/2013  . Esophageal reflux 07/22/2013  . Chronic kidney disease 01/14/2013  . Anemia 11/02/2012  . Hemorrhoid 11/02/2012  . 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 Senile dementia, uncomplicated 44/31/5400  . Iron deficiency anemia 08/13/2012  . HTN (hypertension) 01/06/2012  . CVA (cerebral infarction) 01/04/2012     Labs.  09/22/2014.  WBC 4.0 hemoglobin 9.6 platelets 184.  Sodium 140 potassium 4.5 BUN 32 creatinine 1.2.    08/25/2014.  Sodium 141 potassium 4.1 BUN 26 creatinine 1.04.  08/18/2014.  WBC 4.0 hemoglobin 9.8 platelets 183.  [place] 2.7 otherwise liver  function tests within normal limits  08/14/2014.  Sodium 145 potassium 3.4 BUN 25 creatinine 1.10  05/09/2014.  WBC 5.1 hemoglobin 9.7 platelets 202.   CBC    Component Value Date/Time   WBC 3.8* 12/03/2013 0432   WBC 4.5 10/25/2013   RBC 3.73* 12/03/2013 0432   HGB 10.6* 12/03/2013 0432   HCT 32.5* 12/03/2013 0432   PLT 229 12/03/2013 0432   MCV 87.1 12/03/2013 0432   LYMPHSABS 1.2 12/02/2013 1300   MONOABS 0.4 12/02/2013 1300   EOSABS 0.2 12/02/2013 1300   BASOSABS 0.0 12/02/2013 1300    CMP     Component Value Date/Time   NA 144 05/09/2014   NA 138 12/03/2013 0432   K 3.9 05/09/2014   CL 105 12/03/2013 0432   CO2 24 12/03/2013 0432   GLUCOSE 111* 12/03/2013 0432   BUN 35* 05/09/2014   BUN 18 12/03/2013 0432  CREATININE 0.82 12/03/2013 0432   CREATININE 1.1 10/25/2013   CALCIUM 9.1 12/03/2013 0432   PROT 6.9 12/02/2013 1300   ALBUMIN 2.9* 12/02/2013 1300   AST 10* 05/09/2014   ALT 7 05/09/2014   ALKPHOS 53 05/09/2014   BILITOT 0.2* 12/02/2013 1300   GFRNONAA 60* 12/03/2013 0432   GFRAA 70* 12/03/2013 0432    Assessment and Plan  #1-hypertension- This appears to have stabilized a do not see consistent elevations--  she did have some bradycardia on last visit her metoprolol was decreased unless blood pressu most recently 136/88-129/61-115/53 most pulses appear to be in the 70-80 range occasionally I will see a high 50s but this does not appear to be frequent  #2-history CVA late effect-this appears to be at baseline she is on aspirin for anticoagulation.  #3 history of senile dementia-this appears to be at baseline as well she appears to be doing well with supportive care she had been on Depakote which was weaned off this.  #4 history of iron deficiency anemia last hemoglobin was 9.6 --will update this this appears to be stable-- she is on iron  #5-history of GERD she is on a proton pump inhibitor this appears to be stable.   #6 History of cough  this appears to be getting better per nursing and patient-will order Robitussin every 6 hours when necessary for 7 days and have this monitored with vital signs pulse ox every shift for 48 hours notify provider at any increased cough or congestion  #7-mild hypokalemia --this appears to be stable most recent potassium 4.5  will update this she is on supplementation    . #8-chronic kidney disease this appears to be relatively baseline most recent creatinine of  1.2 BUN of 32--will update this as well this has been stable for some time   [id number]         Safari Cinque C,

## 2015-03-09 ENCOUNTER — Other Ambulatory Visit: Payer: Self-pay | Admitting: *Deleted

## 2015-03-09 MED ORDER — HYDRALAZINE HCL 50 MG PO TABS: ORAL_TABLET | ORAL | Status: DC

## 2015-03-09 NOTE — Telephone Encounter (Signed)
[facility] 

## 2015-03-12 ENCOUNTER — Non-Acute Institutional Stay (SKILLED_NURSING_FACILITY): Payer: Medicare Other | Admitting: Internal Medicine

## 2015-03-12 ENCOUNTER — Encounter: Payer: Self-pay | Admitting: Internal Medicine

## 2015-03-12 DIAGNOSIS — N189 Chronic kidney disease, unspecified: Secondary | ICD-10-CM

## 2015-03-12 DIAGNOSIS — I10 Essential (primary) hypertension: Secondary | ICD-10-CM | POA: Diagnosis not present

## 2015-03-12 DIAGNOSIS — Z8673 Personal history of transient ischemic attack (TIA), and cerebral infarction without residual deficits: Secondary | ICD-10-CM | POA: Diagnosis not present

## 2015-03-12 DIAGNOSIS — D509 Iron deficiency anemia, unspecified: Secondary | ICD-10-CM

## 2015-03-12 NOTE — Progress Notes (Signed)
Patient ID: Amy Martin, female   DOB: 1920-01-11, 81 y.o.   MRN: [id number]    MRN: [id number] Name: Amy Martin  Sex: female Age: 81 y.o. DOB: 1919-04-08  [place] #: [facility] Facility/Room:305W Level Of Care: SNF Provider: Wille Celeste Emergency Contacts: Extended Emergency Contact Information Primary Emergency Contact: Murtha,[name] Address: [address]          [place], [place] [address] Johnnette Litter of [place] Phone: [phone] Mobile Phone: [phone] Relation: Son  Code Status:FULL   Allergies: Review of patient's allergies indicates no known allergies.  Chief Complaint  Patient presents with  . Medical Management of Chronic Issues   including hypertension-history CVA dementia-GERD- anemia  HPI: Patient is 81 y.o. female who is being seen for routine issues including history of hypertension-CVA-dementia-GERD-anemia. Nursing staff does not report any recent acute issues- d   She does have a history of hypertension her lisinopril was recently increased to 40 mg a day she also is on hydralazine 50 mg every 6 hours metoprolol 50 mg twice a day.  Recent blood pressures have been 134/69-134/56-147/62-I got 140/80 manually this evening-  Her other medical issues appear to be relatively stable-he appears to be doing well with supportive care here does ambulate about in the hallway at times.  She has no complaints this evening nursing staff does not report any acute concerns either-        -   Past Medical History  Diagnosis Date  . Cancer ([place])   . Stroke ([place])   . Diabetes mellitus   . Dementia   . Hypertension   . Hyperlipidemia   . Depression   . GERD (gastroesophageal reflux disease)     No past surgical history on file.    Medication List       This list is accurate as of: 03/12/15 11:59 PM.  Always use your most recent med list.               acetaminophen 325 MG tablet  Commonly known as:  TYLENOL  Take 650 mg by mouth  every 4 (four) hours as needed. For pain     aspirin EC 325 MG tablet  Take 325 mg by mouth daily. For CVA     ferrous sulfate 325 (65 FE) MG tablet  Take 325 mg by mouth daily with breakfast. For iron deficiency     hydrALAZINE 50 MG tablet  Commonly known as:  APRESOLINE  Take one tablet by mouth every 6 hours for hypertension     lisinopril 30 MG tablet  Commonly known as:  PRINIVIL,ZESTRIL  Take 40 mg by mouth daily. For HTN     metoprolol 50 MG tablet  Commonly known as:  LOPRESSOR  Take 25 mg by mouth 2 (two) times daily.     multivitamin with minerals Tabs tablet  Take 1 tablet by mouth daily.     omeprazole 20 MG capsule  Commonly known as:  PRILOSEC  Take 20 mg by mouth daily before supper. For reflux     polyethylene glycol packet  Commonly known as:  MIRALAX / GLYCOLAX  Take 17 g by mouth daily. For constipation     potassium chloride 20 MEQ packet  Commonly known as:  KLOR-CON  Take 20 mEq by mouth daily.     protein supplement Powd  Take 1 scoop by mouth 3 (three) times daily with meals. In pudding     senna-docusate 8.6-50 MG tablet  Commonly known as:  Senokot-S  Take 1 tablet by mouth daily. Hold for loose stools        No orders of the defined types were placed in this encounter.    Immunization History  Administered Date(s) Administered  . PPD Test 01/12/2012  . Pneumococcal Polysaccharide-23 12/04/2013    Social History  Substance Use Topics  . Smoking status: Former Smoker    Types: Cigarettes    Quit date: 01/04/1979  . Smokeless tobacco: Not on file  . Alcohol Use: No    Review of Systems  DATA OBTAINED: from nursepatient-, medical record GENERAL:  no fevers or complaints of discomfort SKIN: No itching, rash HEENT: Does not complain of a sore throat-her visual changes RESPIRATORY: Not complaining cough or shortness of breath at times will develop cough symptoms CARDIAC: No chest pain, palpitations, lower extremity edema   GI: No abdominal pain, No N/V/D or constipation, No heartburn or reflux  GU: No dysuria, frequency or urgency, or incontinence  MUSCULOSKELETAL: No unrelieved bone/joint pain NEUROLOGIC: No headache, dizziness  PSYCHIATRIC: No overt anxiety or sadness    Physical Exam She is afebrile pulse of 56 respirations 18 blood pressure 140/80 GENERAL APPEARANCE: Alert, BF No acute distress sitting comfortably in her wheelchair SKIN: No diaphoresis rash HEENT:  Oropharynx is clear mucous membranes moist RESPIRATORY: Breathing is even, unlabored. Lung sounds are clear but poor respiratory effort--  CARDIOVASCULAR: Heart RRR no murmurs, rubs or gallops. No peripheral edema continues to have mild bradycardia at times  GASTROINTESTINAL: Abdomen is soft, non-tender, not distended w/ normal bowel sounds.    MUSCULOSKELETAL: No abnormal joints --general frailty NEUROLOGIC: Cranial nerves 2-12 grossly intact, L side weakness--this is baseline with previous exams--is able to ambulate facility in wheelchair PSYCHIATRIC: dementia, no behavioral issues--she is alert and cooperative with exam follow simple verbal commands is able to have a short conversation which appears to be her baseline I do not really note progression of dementia from last visit    Patient Active Problem List   Diagnosis Date Noted  . History of CVA (cerebrovascular accident) 03/12/2015  . Cough 01/01/2015  . History of stroke 05/08/2014  . Constipation 02/27/2014  . Thrush 12/11/2013  . Protein-calorie malnutrition, severe ([place]) 12/04/2013  . Metabolic encephalopathy 99991111  . Seizure ([place]) 12/02/2013  . Esophageal reflux 07/22/2013  . Chronic kidney disease 01/14/2013  . Anemia 11/02/2012  . Hemorrhoid 11/02/2012  . 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 Senile dementia, uncomplicated [id number]  . Iron deficiency anemia 08/13/2012  . HTN (hypertension) 01/06/2012  . CVA (cerebral infarction) 01/04/2012      Labs  01/02/2015.  Albumin 2.6 bilirubin 0.2 otherwise liver function tests within normal limits   sodium 142 potassium 4.2 BUN 34 creatinine 1.2.  WBC 3.6 hemoglobin 9.1 platelets 186.  09/22/2014.  WBC 4.0 hemoglobin 9.6 platelets 184.  Sodium 140 potassium 4.5 BUN 32 creatinine 1.2.    08/25/2014.  Sodium 141 potassium 4.1 BUN 26 creatinine 1.04.  08/18/2014.  WBC 4.0 hemoglobin 9.8 platelets 183.  [place] 2.7 otherwise liver function tests within normal limits  08/14/2014.  Sodium 145 potassium 3.4 BUN 25 creatinine 1.10  05/09/2014.  WBC 5.1 hemoglobin 9.7 platelets 202.   CBC    Component Value Date/Time   WBC 3.8* 12/03/2013 0432   WBC 4.5 10/25/2013   RBC 3.73* 12/03/2013 0432   HGB 10.6* 12/03/2013 0432   HCT 32.5* 12/03/2013 0432   PLT 229 12/03/2013 0432   MCV 87.1 12/03/2013 0432  LYMPHSABS 1.2 12/02/2013 1300   MONOABS 0.4 12/02/2013 1300   EOSABS 0.2 12/02/2013 1300   BASOSABS 0.0 12/02/2013 1300    CMP     Component Value Date/Time   NA 144 05/09/2014   NA 138 12/03/2013 0432   K 3.9 05/09/2014   CL 105 12/03/2013 0432   CO2 24 12/03/2013 0432   GLUCOSE 111* 12/03/2013 0432   BUN 35* 05/09/2014   BUN 18 12/03/2013 0432   CREATININE 0.82 12/03/2013 0432   CREATININE 1.1 10/25/2013   CALCIUM 9.1 12/03/2013 0432   PROT 6.9 12/02/2013 1300   ALBUMIN 2.9* 12/02/2013 1300   AST 10* 05/09/2014   ALT 7 05/09/2014   ALKPHOS 53 05/09/2014   BILITOT 0.2* 12/02/2013 1300   GFRNONAA 60* 12/03/2013 0432   GFRAA 70* 12/03/2013 0432    Assessment and Plan  #1-hypertension- This appears to have stabilized a do not see consistent elevations-- Pulses appear to run from the high 50s and higher-she is not symptomatic of any bradycardia at this point will monitor    #2-history CVA late effect-this appears to be at baseline she is on aspirin for anticoagulation.  #3 history of senile dementia-this appears to be at baseline as well  she appears to be doing well with supportive care she had been on Depakote which was weaned off th.  #4 history of iron deficiency anemia last hemoglobin was 9.1 --will update this this appears to be  fairly stable-- she is on iron--with suspicions there is an element of chronic disease here  #5-history of GERD she is on a proton pump inhibitor this appears to be stable.    6 #7-mild hypokalemia --this appears to be stable most recent potassium 4.2  will update this she is on supplementation    . #8-chronic kidney disease this appears to be relatively baseline most recent creatinine of  1.2 BUN of 34--will update this as well this has been stable for some time   [id number]         Sarahann Horrell C,

## 2015-03-13 LAB — BASIC METABOLIC PANEL
BUN: 26 mg/dL — AB (ref 4–21)
Creatinine: 1.1 mg/dL (ref 0.5–1.1)
Glucose: 89 mg/dL
Potassium: 3.8 mmol/L (ref 3.4–5.3)
Sodium: 142 mmol/L (ref 137–147)

## 2015-03-13 LAB — CBC AND DIFFERENTIAL
HEMATOCRIT: 28 % — AB (ref 36–46)
Hemoglobin: 9.1 g/dL — AB (ref 12.0–16.0)
Platelets: 177 10*3/uL (ref 150–399)
WBC: 3.9 10*3/mL

## 2015-04-19 ENCOUNTER — Non-Acute Institutional Stay (SKILLED_NURSING_FACILITY): Payer: Medicare Other | Admitting: Internal Medicine

## 2015-04-19 ENCOUNTER — Encounter: Payer: Self-pay | Admitting: Internal Medicine

## 2015-04-19 DIAGNOSIS — F0390 Unspecified dementia without behavioral disturbance: Secondary | ICD-10-CM

## 2015-04-19 DIAGNOSIS — Z8673 Personal history of transient ischemic attack (TIA), and cerebral infarction without residual deficits: Secondary | ICD-10-CM | POA: Diagnosis not present

## 2015-04-19 DIAGNOSIS — D509 Iron deficiency anemia, unspecified: Secondary | ICD-10-CM

## 2015-04-19 DIAGNOSIS — I10 Essential (primary) hypertension: Secondary | ICD-10-CM | POA: Diagnosis not present

## 2015-04-19 DIAGNOSIS — N189 Chronic kidney disease, unspecified: Secondary | ICD-10-CM | POA: Diagnosis not present

## 2015-04-19 NOTE — Progress Notes (Signed)
Patient ID: Amy Martin, female   DOB: 23-Oct-1919, 82 y.o.   MRN: [id number]     MRN: [id number] Name: Amy Martin  Sex: female Age: 82 y.o. DOB: [birth year]  [place] #: [facility] Facility/Room:305W Level Of Care: SNF Provider: Wille Celeste Emergency Contacts: Extended Emergency Contact Information Primary Emergency Contact: Amy Martin Address: [address]          [place], [place] [address] Johnnette Litter of [place] Phone: [phone] Mobile Phone: [phone] Relation: Son  Code Status:FULL   Allergies: Review of patient's allergies indicates no known allergies.  Chief Complaint  Patient presents with  . Medical Management of Chronic Issues   including hypertension-history CVA dementia-GERD- anemia  HPI: Patient is 82 y.o. female who is being seen for routine issues including history of hypertension-CVA-dementia-GERD-anemia. Nursing staff does not report any recent acute issues- d   She does have a history of hypertension her lisinopril was recently increased to 40 mg a day she also is on hydralazine 50 mg every 6 hours metoprolol 50 mg twice a day.  Recent blood pressures have been 144/78-150/68 108/50-there is quite a bit of variability I did take it manually this afternoon and got 124/60-  Her other medical issues appear to be relatively stable-happears to be doing well with supportive care here does ambulate about in the hallway at times.  She has no complaints this evening nursing staff does not report any acute concerns either Family member does state she occasionally has hard stools-she is on MiraLAX-according to her family member she does well with prune juice will add this to her daily diet-        -   Past Medical History  Diagnosis Date  . Cancer ([place])   . Stroke ([place])   . Diabetes mellitus   . Dementia   . Hypertension   . Hyperlipidemia   . Depression   . GERD (gastroesophageal reflux disease)     No past surgical history on  file.    Medication List       This list is accurate as of: 04/19/15  4:31 PM.  Always use your most recent med list.               acetaminophen 325 MG tablet  Commonly known as:  TYLENOL  Take 650 mg by mouth every 4 (four) hours as needed. For pain     aspirin EC 325 MG tablet  Take 325 mg by mouth daily. For CVA     ferrous sulfate 325 (65 FE) MG tablet  Take 325 mg by mouth daily with breakfast. For iron deficiency     hydrALAZINE 50 MG tablet  Commonly known as:  APRESOLINE  Take one tablet by mouth every 6 hours for hypertension     lisinopril 30 MG tablet  Commonly known as:  PRINIVIL,ZESTRIL  Take 40 mg by mouth daily. For HTN     metoprolol 50 MG tablet  Commonly known as:  LOPRESSOR  Take 25 mg by mouth 2 (two) times daily.     multivitamin with minerals Tabs tablet  Take 1 tablet by mouth daily.     omeprazole 20 MG capsule  Commonly known as:  PRILOSEC  Take 20 mg by mouth daily before supper. For reflux     polyethylene glycol packet  Commonly known as:  MIRALAX / GLYCOLAX  Take 17 g by mouth daily. For constipation     potassium chloride 20 MEQ packet  Commonly known as:  KLOR-CON  Take 20 mEq by mouth daily.     protein supplement Powd  Take 1 scoop by mouth 3 (three) times daily with meals. In pudding     senna-docusate 8.6-50 MG tablet  Commonly known as:  Senokot-S  Take 1 tablet by mouth daily. Hold for loose stools        No orders of the defined types were placed in this encounter.    Immunization History  Administered Date(s) Administered  . PPD Test 01/12/2012  . Pneumococcal Polysaccharide-23 12/04/2013    Social History  Substance Use Topics  . Smoking status: Former Smoker    Types: Cigarettes    Quit date: 01/04/1979  . Smokeless tobacco: Not on file  . Alcohol Use: No    Review of Systems  DATA OBTAINED: from nursepatient-, m GENERAL:  no fevers or complaints of discomfort SKIN: No itching, rash HEENT: Does  not complain of a sore throat-or visual changes RESPIRATORY: Not complaining cough or shortness of breath  CARDIAC: No chest pain, palpitations, lower extremity edema  GI: No abdominal pain, No N/V/D at times has constipation, No heartburn or reflux  GU: No dysuria, frequency or urgency, or incontinence  MUSCULOSKELETAL: No unrelieved bone/joint pain NEUROLOGIC: No headache, dizziness  PSYCHIATRIC: No overt anxiety or sadness    Physical Exam She is afebrile pulses 74 respirations 20 blood pressure taken manually 124/60 GENERAL APPEARANCE: Alert, BF No acute distress sitting comfortably in her wheelchair SKIN: No diaphoresis rash HEENT eyes she has prescription lenses sclera and conjunctiva are clear visual acuity appears grossly intact:  Oropharynx is clear mucous membranes moist RESPIRATORY: Breathing is even, unlabored. Lung sounds are clear but poor respiratory effort--  CARDIOVASCULAR: Heart RRR with an occasional irregular beat no murmurs, rubs or gallops. No peripheral edema   GASTROINTESTINAL: Abdomen is soft, non-tender, not distended w/ normal bowel sounds.    MUSCULOSKELETAL: No abnormal joints --general frailty--ambulates independently in her wheelchair NEUROLOGIC: Cranial nerves 2-12 grossly intact, L side weakness although she actually has pretty good grip strength on the left--this is baseline with previous exams--is able to ambulate facility in wheelchair PSYCHIATRIC: dementia, no behavioral issues--she is alert and cooperative with exam follow simple verbal commands is able to have a short conversation which appears to be her baseline I do not really note progression of dementia from last visit    Patient Active Problem List   Diagnosis Date Noted  . History of CVA (cerebrovascular accident) 03/12/2015  . Cough 01/01/2015  . History of stroke 05/08/2014  . Constipation 02/27/2014  . Thrush 12/11/2013  . Protein-calorie malnutrition, severe ([place]) 12/04/2013  .  Metabolic encephalopathy 99991111  . Seizure ([facility]) 12/02/2013  . Esophageal reflux 07/22/2013  . Chronic kidney disease 01/14/2013  . Anemia 11/02/2012  . Hemorrhoid 11/02/2012  . 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 Senile dementia, uncomplicated [id number]  . Iron deficiency anemia 08/13/2012  . HTN (hypertension) 01/06/2012  . CVA (cerebral infarction) 01/04/2012     Labs  03/13/2015.  Sodium 142 potassium 3.8 BUN 26 creatinine 1.1.  WBC 3.9 hemoglobin 9.1 platelets 177.    01/02/2015.  Albumin 2.6 bilirubin 0.2 otherwise liver function tests within normal limits   sodium 142 potassium 4.2 BUN 34 creatinine 1.2.  WBC 3.6 hemoglobin 9.1 platelets 186.  09/22/2014.  WBC 4.0 hemoglobin 9.6 platelets 184.  Sodium 140 potassium 4.5 BUN 32 creatinine 1.2.    08/25/2014.  Sodium 141 potassium 4.1 BUN 26 creatinine 1.04.  08/18/2014.  WBC 4.0 hemoglobin 9.8 platelets 183.  [place] 2.7 otherwise liver function tests within normal limits  08/14/2014.  Sodium 145 potassium 3.4 BUN 25 creatinine 1.10  05/09/2014.  WBC 5.1 hemoglobin 9.7 platelets 202.   CBC    Component Value Date/Time   WBC 3.8* 12/03/2013 0432   WBC 4.5 10/25/2013   RBC 3.73* 12/03/2013 0432   HGB 10.6* 12/03/2013 0432   HCT 32.5* 12/03/2013 0432   PLT 229 12/03/2013 0432   MCV 87.1 12/03/2013 0432   LYMPHSABS 1.2 12/02/2013 1300   MONOABS 0.4 12/02/2013 1300   EOSABS 0.2 12/02/2013 1300   BASOSABS 0.0 12/02/2013 1300    CMP     Component Value Date/Time   NA 144 05/09/2014   NA 138 12/03/2013 0432   K 3.9 05/09/2014   CL 105 12/03/2013 0432   CO2 24 12/03/2013 0432   GLUCOSE 111* 12/03/2013 0432   BUN 35* 05/09/2014   BUN 18 12/03/2013 0432   CREATININE 0.82 12/03/2013 0432   CREATININE 1.1 10/25/2013   CALCIUM 9.1 12/03/2013 0432   PROT 6.9 12/02/2013 1300   ALBUMIN 2.9* 12/02/2013 1300   AST 10* 05/09/2014   ALT 7 05/09/2014   ALKPHOS 53  05/09/2014   BILITOT 0.2* 12/02/2013 1300   GFRNONAA 60* 12/03/2013 0432   GFRAA 70* 12/03/2013 0432    Assessment and Plan  #1-hypertension-  Do not really see consistent elevations blood pressure 124/60 on exam today I do see some listed systolics in the 1 [id number] area-at this point continue to monitor-I wonder if there is some machine variation here-she continues on Lopressor 25 mg twice a day as well as hydralazine 50 mg every 6 hours and lisinopril 40 mg a day will update a metabolic panel   [id number] CVA late effect-this appears to be at baseline she is on aspirin for anticoagulation.  #3 history of senile dementia-this appears to be at baseline as well she appears to be doing well with supportive care she had been on Depakote which was weaned off.  #4 history of iron deficiency anemia last hemoglobin was 9.1 --will update this this appears to be  fairly stable-- she is on iron--with suspicions there is an element of chronic disease here  #5-history of GERD she is on a proton pump inhibitor this appears to be stable.     #7-mild hypokalemia --this appears to be stable most recent potassium 3.8  will update this she is on supplementation    . #8-chronic kidney disease this appears to be relatively baseline most recent creatinine of  1.1 BUN of 26--will update this as well this has been stable for some time   #9 history of constipation she is on MiraLAX will add prune juice apparently she is having some hard stools-continue to monitor  [id number]         Keylee Shrestha C,

## 2015-04-20 LAB — BASIC METABOLIC PANEL
BUN: 25 mg/dL — AB (ref 4–21)
Creatinine: 1 mg/dL (ref 0.5–1.1)
Glucose: 80 mg/dL
POTASSIUM: 4.2 mmol/L (ref 3.4–5.3)
Sodium: 142 mmol/L (ref 137–147)

## 2015-04-20 LAB — CBC AND DIFFERENTIAL
HCT: 27 % — AB (ref 36–46)
Hemoglobin: 8.8 g/dL — AB (ref 12.0–16.0)
Platelets: 172 10*3/uL (ref 150–399)
WBC: 4.1 10*3/mL

## 2015-04-20 LAB — HEPATIC FUNCTION PANEL
ALT: 9 U/L (ref 7–35)
AST: 19 U/L (ref 13–35)
Alkaline Phosphatase: 50 U/L (ref 25–125)
BILIRUBIN, TOTAL: 0.3 mg/dL

## 2015-05-08 LAB — CBC AND DIFFERENTIAL
HCT: 31 % — AB (ref 36–46)
HEMOGLOBIN: 10.2 g/dL — AB (ref 12.0–16.0)
Platelets: 219 10*3/uL (ref 150–399)
WBC: 6.3 10^3/mL

## 2015-05-09 ENCOUNTER — Encounter: Payer: Self-pay | Admitting: Internal Medicine

## 2015-05-09 ENCOUNTER — Non-Acute Institutional Stay (SKILLED_NURSING_FACILITY): Payer: Medicare Other | Admitting: Internal Medicine

## 2015-05-09 DIAGNOSIS — D509 Iron deficiency anemia, unspecified: Secondary | ICD-10-CM

## 2015-05-09 DIAGNOSIS — K219 Gastro-esophageal reflux disease without esophagitis: Secondary | ICD-10-CM | POA: Diagnosis not present

## 2015-05-09 DIAGNOSIS — N183 Chronic kidney disease, stage 3 unspecified: Secondary | ICD-10-CM

## 2015-05-09 NOTE — Progress Notes (Signed)
MRN: [id number] Name: Amy Martin  Sex: female Age: 82 y.o. DOB: 01-21-1920  [place] #: [facility] Facility/Room: Level Of Care: SNF Provider: Inocencio Homes D Emergency Contacts: Extended Emergency Contact Information Primary Emergency Contact: Castagna,[name] Address: [address]          [place], [place] [address] Johnnette Litter of [place] Phone: [phone] Mobile Phone: [phone] Relation: Son  Code Status:   Allergies: Review of patient's allergies indicates no known allergies.  Chief Complaint  Patient presents with  . Medical Management of Chronic Issues    HPI: Patient is 82 y.o. female who is being seen for routine issues of GERD, CKD3, and  Anemia.  Past Medical History  Diagnosis Date  . Cancer ([place])   . Stroke ([place])   . Diabetes mellitus   . Dementia   . Hypertension   . Hyperlipidemia   . Depression   . GERD (gastroesophageal reflux disease)     History reviewed. No pertinent past surgical history.    Medication List       This list is accurate as of: 05/09/15 11:59 PM.  Always use your most recent med list.               acetaminophen 325 MG tablet  Commonly known as:  TYLENOL  Take 650 mg by mouth every 4 (four) hours as needed. For pain     aspirin EC 325 MG tablet  Take 325 mg by mouth daily. For CVA     ferrous sulfate 325 (65 FE) MG tablet  Take 325 mg by mouth daily with breakfast. For iron deficiency     hydrALAZINE 50 MG tablet  Commonly known as:  APRESOLINE  Take one tablet by mouth every 6 hours for hypertension     lisinopril 30 MG tablet  Commonly known as:  PRINIVIL,ZESTRIL  Take 40 mg by mouth daily. For HTN     metoprolol 50 MG tablet  Commonly known as:  LOPRESSOR  Take 25 mg by mouth 2 (two) times daily.     multivitamin with minerals Tabs tablet  Take 1 tablet by mouth daily.     omeprazole 20 MG capsule  Commonly known as:  PRILOSEC  Take 20 mg by mouth daily before supper. For reflux      polyethylene glycol packet  Commonly known as:  MIRALAX / GLYCOLAX  Take 17 g by mouth daily. For constipation     potassium chloride 20 MEQ packet  Commonly known as:  KLOR-CON  Take 20 mEq by mouth daily.     protein supplement Powd  Take 1 scoop by mouth 3 (three) times daily with meals. In pudding     senna-docusate 8.6-50 MG tablet  Commonly known as:  Senokot-S  Take 1 tablet by mouth daily. Hold for loose stools        No orders of the defined types were placed in this encounter.    Immunization History  Administered Date(s) Administered  . PPD Test 01/12/2012  . Pneumococcal Polysaccharide-23 12/04/2013    Social History  Substance Use Topics  . Smoking status: Former Smoker    Types: Cigarettes    Quit date: 01/04/1979  . Smokeless tobacco: Not on file  . Alcohol Use: No    Review of Systems  DATA OBTAINED: from nurse GENERAL:  no fevers, fatigue, appetite changes SKIN: No itching, rash HEENT: No complaint RESPIRATORY: No cough, wheezing, SOB CARDIAC: No chest pain, palpitations, lower extremity edema  GI: No  abdominal pain, No N/V/D or constipation, No heartburn or reflux  GU: No dysuria, frequency or urgency, or incontinence  MUSCULOSKELETAL: No unrelieved bone/joint pain NEUROLOGIC: No headache, dizziness  PSYCHIATRIC: No overt anxiety or sadness  Filed Vitals:   05/09/15 1416  BP: 102/58  Pulse: 68  Temp: 97 F (36.1 C)  Resp: 18    Physical Exam  GENERAL APPEARANCE: Alert, conversant, No acute distress  SKIN: No diaphoresis rash HEENT: Unremarkable RESPIRATORY: Breathing is even, unlabored. Lung sounds are clear   CARDIOVASCULAR: Heart RRR no murmurs, rubs or gallops. No peripheral edema  GASTROINTESTINAL: Abdomen is soft, non-tender, not distended w/ normal bowel sounds.  GENITOURINARY: Bladder non tender, not distended  MUSCULOSKELETAL: No abnormal joints or musculature NEUROLOGIC: Cranial nerves 2-12 grossly intact; L side  weakness PSYCHIATRIC: dementia, no behavioral issues  Patient Active Problem List   Diagnosis Date Noted  . History of CVA (cerebrovascular accident) 03/12/2015  . Cough 01/01/2015  . History of stroke 05/08/2014  . Constipation 02/27/2014  . Thrush 12/11/2013  . Protein-calorie malnutrition, severe ([place]) 12/04/2013  . Metabolic encephalopathy 99991111  . Seizure ([facility]) 12/02/2013  . Esophageal reflux 07/22/2013  . CKD (chronic kidney disease) stage 3, GFR 30-59 ml/min 01/14/2013  . Anemia 11/02/2012  . Hemorrhoid 11/02/2012  . 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 Senile dementia, uncomplicated [id number]  . Iron deficiency anemia 08/13/2012  . HTN (hypertension) 01/06/2012  . CVA (cerebral infarction) 01/04/2012    CBC    Component Value Date/Time   WBC 3.8* 12/03/2013 0432   WBC 4.5 10/25/2013   RBC 3.73* 12/03/2013 0432   HGB 10.6* 12/03/2013 0432   HCT 32.5* 12/03/2013 0432   PLT 229 12/03/2013 0432   MCV 87.1 12/03/2013 0432   LYMPHSABS 1.2 12/02/2013 1300   MONOABS 0.4 12/02/2013 1300   EOSABS 0.2 12/02/2013 1300   BASOSABS 0.0 12/02/2013 1300    CMP     Component Value Date/Time   NA 144 05/09/2014   NA 138 12/03/2013 0432   K 3.9 05/09/2014   CL 105 12/03/2013 0432   CO2 24 12/03/2013 0432   GLUCOSE 111* 12/03/2013 0432   BUN 35* 05/09/2014   BUN 18 12/03/2013 0432   CREATININE 0.82 12/03/2013 0432   CREATININE 1.1 10/25/2013   CALCIUM 9.1 12/03/2013 0432   PROT 6.9 12/02/2013 1300   ALBUMIN 2.9* 12/02/2013 1300   AST 10* 05/09/2014   ALT 7 05/09/2014   ALKPHOS 53 05/09/2014   BILITOT 0.2* 12/02/2013 1300   GFRNONAA 60* 12/03/2013 0432   GFRAA 70* 12/03/2013 0432    Assessment and Plan  Esophageal reflux No signs or sx of reflux;plan -cont omeprazole 20 mg daily  CKD (chronic kidney disease) stage 3, GFR 30-59 ml/min Most recent GFR 56, Cr 1.1; stable, will monitorintermiddently  Iron deficiency anemia Most recent Hb  9.1, a slt decrease from prior;pt continues on iron daily; will order B12, folate, TSH    Sheppard Coil, Noah Delaine, MD

## 2015-05-10 LAB — TSH: TSH: 0.75 u[IU]/mL (ref 0.41–5.90)

## 2015-05-18 ENCOUNTER — Encounter: Payer: Self-pay | Admitting: Internal Medicine

## 2015-05-18 NOTE — Assessment & Plan Note (Signed)
Most recent GFR 56, Cr 1.1; stable, will monitorintermiddently

## 2015-05-18 NOTE — Assessment & Plan Note (Signed)
No signs or sx of reflux;plan -cont omeprazole 20 mg daily

## 2015-05-18 NOTE — Assessment & Plan Note (Signed)
Most recent Hb 9.1, a slt decrease from prior;pt continues on iron daily; will order B12, folate, TSH

## 2015-06-04 ENCOUNTER — Encounter: Payer: Self-pay | Admitting: Nurse Practitioner

## 2015-06-04 ENCOUNTER — Non-Acute Institutional Stay (SKILLED_NURSING_FACILITY): Payer: Medicare Other | Admitting: Nurse Practitioner

## 2015-06-04 DIAGNOSIS — I6990 Unspecified sequelae of unspecified cerebrovascular disease: Secondary | ICD-10-CM

## 2015-06-04 DIAGNOSIS — N183 Chronic kidney disease, stage 3 unspecified: Secondary | ICD-10-CM

## 2015-06-04 DIAGNOSIS — I10 Essential (primary) hypertension: Secondary | ICD-10-CM

## 2015-06-04 DIAGNOSIS — F0390 Unspecified dementia without behavioral disturbance: Secondary | ICD-10-CM

## 2015-06-04 DIAGNOSIS — D508 Other iron deficiency anemias: Secondary | ICD-10-CM

## 2015-06-04 NOTE — Progress Notes (Signed)
Patient ID: Amy Martin, female   DOB: Nov 25, 1919, 82 y.o.   MRN: [id number]    Nursing Home Location:  [facility] of Service: SNF (31)  PCP: Hennie Duos, MD  No Known Allergies  Chief Complaint  Patient presents with  . Medical Management of Chronic Issues    Routine Visit    HPI:  Patient is a 82 y.o. female seen today at [facility] for routine follow up on chronic conditions. Pt with a pmh of CVA, DM, dementia, HTN, hyperlipidemia, anemia, GERD, constipation. Limited ROS and HPI due to dementia. Staff without concerns at this time. There has been no acute issues in the last month. Staff report pts chronic conditions have been stable from nursing stand point.   Review of Systems:  Review of Systems  Unable to perform ROS: Dementia    Past Medical History  Diagnosis Date  . Cancer ([place])   . Stroke ([place])   . Diabetes mellitus   . Dementia   . Hypertension   . Hyperlipidemia   . Depression   . GERD (gastroesophageal reflux disease)    History reviewed. No pertinent past surgical history. Social History:   reports that she quit smoking about 36 years ago. Her smoking use included Cigarettes. She does not have any smokeless tobacco history on file. She reports that she does not drink alcohol. Her drug history is not on file.  History reviewed. No pertinent family history.  Medications: Patient's Medications  New Prescriptions   No medications on file  Previous Medications   ACETAMINOPHEN (TYLENOL) 325 MG TABLET    Take 650 mg by mouth every 4 (four) hours as needed. For pain   AMBULATORY NON FORMULARY MEDICATION    Med Pass: Give 4 oz by mouth QID   ASPIRIN EC 325 MG TABLET    Take 325 mg by mouth daily. For CVA   ENSURE (ENSURE)    Take 1 Can by mouth 2 (two) times daily.   FERROUS SULFATE 325 (65 FE) MG TABLET    Take 325 mg by mouth daily with breakfast. For iron deficiency   HYDRALAZINE  (APRESOLINE) 50 MG TABLET    Take one tablet by mouth every 6 hours for hypertension   LISINOPRIL (PRINIVIL,ZESTRIL) 30 MG TABLET    Take 40 mg by mouth daily. For HTN   METOPROLOL (LOPRESSOR) 50 MG TABLET    Take 25 mg by mouth 2 (two) times daily.    MULTIPLE VITAMIN (MULTIVITAMIN WITH MINERALS) TABS    Take 1 tablet by mouth daily.   NITROGLYCERIN (NITROSTAT) 0.4 MG SL TABLET    Place 0.4 mg under the tongue every 5 (five) minutes as needed for chest pain.   OMEPRAZOLE (PRILOSEC) 20 MG CAPSULE    Take 20 mg by mouth daily before supper. For reflux   POLYETHYLENE GLYCOL (MIRALAX / GLYCOLAX) PACKET    Take 17 g by mouth daily. For constipation   POTASSIUM CHLORIDE (KLOR-CON) 20 MEQ PACKET    Take 20 mEq by mouth daily.   SENNA-DOCUSATE (SENOKOT-S) 8.6-50 MG PER TABLET    Take 1 tablet by mouth daily. Hold for loose stools  Modified Medications   No medications on file  Discontinued Medications   PROTEIN SUPPLEMENT (RESOURCE BENEPROTEIN) POWD    Take 1 scoop by mouth 3 (three) times daily with meals. In pudding     Physical Exam: Filed Vitals:   06/04/15 1525  BP: 102/58  Pulse: 54  Temp: 97 F (36.1 C)  TempSrc: Oral  Resp: 18  Height: 5\' 4"  (1.626 m)  Weight: 105 lb 3.2 oz (47.718 kg)    Physical Exam  Constitutional: She appears well-developed. No distress.  HENT:  Head: Normocephalic and atraumatic.  Mouth/Throat: Oropharynx is clear and moist. No oropharyngeal exudate.  Eyes: EOM are normal. Pupils are equal, round, and reactive to light.  Neck: Neck supple.  Cardiovascular: Normal rate, regular rhythm and normal heart sounds.   Pulmonary/Chest: Effort normal and breath sounds normal.  Abdominal: Soft. Bowel sounds are normal.  Musculoskeletal: She exhibits no edema or tenderness.  Neurological: She is alert.  Skin: Skin is warm.  Psychiatric: She has a normal mood and affect.  Vitals reviewed.   Labs reviewed: Basic Metabolic Panel:  Recent Labs  03/13/15  04/20/15  NA 142 142  K 3.8 4.2  BUN 26* 25*  CREATININE 1.1 1.0   Liver Function Tests:  Recent Labs  04/20/15  AST 19  ALT 9  ALKPHOS 50   No results for input(s): LIPASE, AMYLASE in the last 8760 hours. No results for input(s): AMMONIA in the last 8760 hours. CBC:  Recent Labs  03/13/15 04/20/15 05/08/15  WBC 3.9 4.1 6.3  HGB 9.1* 8.8* 10.2*  HCT 28* 27* 31*  PLT 177 172 219   TSH:  Recent Labs  05/10/15  TSH 0.75   A1C: Lab Results  Component Value Date   HGBA1C 5.9* 12/03/2013   Lipid Panel: No results for input(s): CHOL, HDL, LDLCALC, TRIG, CHOLHDL, LDLDIRECT in the last 8760 hours.   Folate 05/10/15:     7.4  Vitamin B12  05/10/15:    429      Assessment/Plan 1. Other iron deficiency anemias Hgb improved on labs from feb. conts on iron daily    2. Late effects of cerebrovascular disease Stable, without changes. conts on ASA 325 mg daily   3. CKD (chronic kidney disease) stage 3, GFR 30-59 ml/min BUN/Cr stable in January. To stay hydrated and avoid NSAIDs   4. Senile dementia, uncomplicated Stable, without acute changes to cognitive or functional status.   5. Essential hypertension Blood pressure stable, conts on lisinopril, lopressor, hydralazine.    Carlos American. Harle Battiest  [facility] [phone] 8 am - 5 pm) [phone] (after hours)

## 2015-06-18 ENCOUNTER — Non-Acute Institutional Stay (SKILLED_NURSING_FACILITY): Payer: Medicare Other | Admitting: Internal Medicine

## 2015-06-18 ENCOUNTER — Encounter: Payer: Self-pay | Admitting: Internal Medicine

## 2015-06-18 DIAGNOSIS — Z8673 Personal history of transient ischemic attack (TIA), and cerebral infarction without residual deficits: Secondary | ICD-10-CM

## 2015-06-18 DIAGNOSIS — K130 Diseases of lips: Secondary | ICD-10-CM | POA: Diagnosis not present

## 2015-06-18 DIAGNOSIS — F0390 Unspecified dementia without behavioral disturbance: Secondary | ICD-10-CM | POA: Diagnosis not present

## 2015-06-18 DIAGNOSIS — I10 Essential (primary) hypertension: Secondary | ICD-10-CM | POA: Diagnosis not present

## 2015-06-18 DIAGNOSIS — D509 Iron deficiency anemia, unspecified: Secondary | ICD-10-CM | POA: Diagnosis not present

## 2015-06-18 DIAGNOSIS — R600 Localized edema: Secondary | ICD-10-CM | POA: Insufficient documentation

## 2015-06-18 NOTE — Progress Notes (Signed)
Patient ID: Amy Martin, female   DOB: February 02, 1920, 82 y.o.   MRN: [id number]     Nursing Home Location:  [place] of Service: SNF (31)  PCP: Hennie Duos, MD  No Known Allergies This is an acute visit.  Chief complaint-acute visit secondary to lip swelling   HPI:  Patient is a 82 y.o. female seen today at [facility] for an acute visits. Pt with a pmh of CVA, DM, dementia, HTN, hyperlipidemia, anemia, GERD, constipation. Limited ROS and HPI due to dementia.  Apparently earlier today patient had swelling of her lower lip apparently this has improved somewhat-there was no complaints of shortness of breath vital signs were stable there was no other edema noted.  Currently patient's vital signs are stable she has no acute complaints but she does have dementia which limits her review of systems-.  I do note she is on lisinopril and has been on this for some time which can cause swelling at times.  In regards to blood pressure she is on hydralazine 50 mg every 6 hours lisinopril 40 mg a day as well as Lopressor 25 mg twice a day.  Blood pressure today is 134/76 previously 137/60-140/57-148/72    Review of Systems:  Review of Systems  Unable to perform ROS: Dementia  please see history of present illness   Past Medical History  Diagnosis Date  . Cancer ([place])   . Stroke ([place])   . Diabetes mellitus   . Dementia   . Hypertension   . Hyperlipidemia   . Depression   . GERD (gastroesophageal reflux disease)    No past surgical history on file. Social History:   reports that she quit smoking about 36 years ago. Her smoking use included Cigarettes. She does not have any smokeless tobacco history on file. She reports that she does not drink alcohol. Her drug history is not on file.  No family history on file.  Medications: Patient's Medications  New Prescriptions   No medications on file  Previous Medications   ACETAMINOPHEN (TYLENOL) 325 MG TABLET    Take 650 mg by mouth every 4 (four) hours as needed. For pain   AMBULATORY NON FORMULARY MEDICATION    Med Pass: Give 4 oz by mouth QID   ASPIRIN EC 325 MG TABLET    Take 325 mg by mouth daily. For CVA   ENSURE (ENSURE)    Take 1 Can by mouth 2 (two) times daily.   FERROUS SULFATE 325 (65 FE) MG TABLET    Take 325 mg by mouth daily with breakfast. For iron deficiency   HYDRALAZINE (APRESOLINE) 50 MG TABLET    Take one tablet by mouth every 6 hours for hypertension   METOPROLOL (LOPRESSOR) 50 MG TABLET    Take 25 mg by mouth 2 (two) times daily.    MULTIPLE VITAMIN (MULTIVITAMIN WITH MINERALS) TABS    Take 1 tablet by mouth daily.   NITROGLYCERIN (NITROSTAT) 0.4 MG SL TABLET    Place 0.4 mg under the tongue every 5 (five) minutes as needed for chest pain.   OMEPRAZOLE (PRILOSEC) 20 MG CAPSULE    Take 20 mg by mouth daily before supper. For reflux   POLYETHYLENE GLYCOL (MIRALAX / GLYCOLAX) PACKET    Take 17 g by mouth daily. For constipation   POTASSIUM CHLORIDE (KLOR-CON) 20 MEQ PACKET    Take 20 mEq by mouth daily.   SENNA-DOCUSATE (SENOKOT-S) 8.6-50 MG PER  TABLET    Take 1 tablet by mouth daily. Hold for loose stools  Modified Medications   No medications on file  Discontinued Medications   LISINOPRIL (PRINIVIL,ZESTRIL) 30 MG TABLET    Take 40 mg by mouth daily. For HTN     Physical Exam: Filed Vitals:   06/18/15 2313  BP: 134/76  Pulse: 81  Resp: 16    Physical Exam  Constitutional: She appears well-developed. No distress.  HENT:  Head: Normocephalic and atraumatic.  Mouth/Throat: Oropharynx is clear and moist. No oropharyngeal exudateSome fairly minimal edema of her lower lip at this time apparently had been more impressive earlier today--is no tenderness or erythema or increased warmth.  Eyes: EOM are normal. Pupils are equal, round, and reactive to light.  Neck: Neck supple.  Cardiovascular: Normal rate, regular rhythm and normal heart  sounds.   Pulmonary/Chest: Effort normal and breath sounds normal.  Abdominal: Soft. Bowel sounds are normal.  Musculoskeletal: She exhibits no edema or tenderness.  Neurological: She is alert.  Skin: Skin is warm.  Psychiatric: She has a normal mood and affect.  Vitals reviewed.   Labs reviewed  05/10/2015.  Folate 7.4.  TSH 0.75.  B12 429.  05/07/2015.  WBC 6.3 hemoglobin 10.2 platelets 219.  : Basic Metabolic Panel:  Recent Labs  03/13/15 04/20/15  NA 142 142  K 3.8 4.2  BUN 26* 25*  CREATININE 1.1 1.0   Liver Function Tests:  Recent Labs  04/20/15  AST 19  ALT 9  ALKPHOS 50   No results for input(s): LIPASE, AMYLASE in the last 8760 hours. No results for input(s): AMMONIA in the last 8760 hours. CBC:  Recent Labs  03/13/15 04/20/15 05/08/15  WBC 3.9 4.1 6.3  HGB 9.1* 8.8* 10.2*  HCT 28* 27* 31*  PLT 177 172 219   TSH:  Recent Labs  05/10/15  TSH 0.75   A1C: Lab Results  Component Value Date   HGBA1C 5.9* 12/03/2013   Lipid Panel: No results for input(s): CHOL, HDL, LDLCALC, TRIG, CHOLHDL, LDLDIRECT in the last 8760 hours.   Folate 05/10/15:     7.4  Vitamin B12  05/10/15:    429  Assessment and plan.  #1-left swelling-this appears to have largely subsided there is no other evidence of edema or anaphylactic reaction-one would be concerned about the lisinopril-this was discussed with Dr. Sheppard Coil via phone-we will discontinue the lisinopril.  #2 in regards to hypertension will increase her hydralazine 200 mg every 6 hours up from 50 mg every 6 hours-continue the Lopressor as well again lisinopril has been discontinued monitor blood pressures every shift log  BP  for review next week       3. Other iron deficiency anemias Hgb improved on labs from feb. conts on iron dailyUpdate CBC Today   4 Late effects of cerebrovascular disease Stable, without changes. conts on ASA 325 mg daily   5 CKD (chronic kidney disease) stage 3, GFR  30-59 ml/min BUN/Cr stable in January. To stay hydrated and avoid NSAIDs update BMP  6 Senile dementia, uncomplicated Stable, without acute changes to cognitive or functional status.     [id number]

## 2015-06-20 LAB — HEPATIC FUNCTION PANEL
ALT: 7 U/L (ref 7–35)
AST: 13 U/L (ref 13–35)
Alkaline Phosphatase: 67 U/L (ref 25–125)
Bilirubin, Total: 0.3 mg/dL

## 2015-06-20 LAB — CBC AND DIFFERENTIAL
HCT: 33 % — AB (ref 36–46)
Hemoglobin: 10.6 g/dL — AB (ref 12.0–16.0)
Platelets: 261 10*3/uL (ref 150–399)
WBC: 5 10*3/mL

## 2015-06-20 LAB — BASIC METABOLIC PANEL
BUN: 29 mg/dL — AB (ref 4–21)
CREATININE: 1.2 mg/dL — AB (ref 0.5–1.1)
GLUCOSE: 128 mg/dL
Potassium: 4.2 mmol/L (ref 3.4–5.3)
Sodium: 144 mmol/L (ref 137–147)

## 2015-06-27 LAB — HEMOGLOBIN A1C: Hemoglobin A1C: 5.7

## 2015-07-05 ENCOUNTER — Non-Acute Institutional Stay (SKILLED_NURSING_FACILITY): Payer: Medicare Other | Admitting: Internal Medicine

## 2015-07-05 DIAGNOSIS — I10 Essential (primary) hypertension: Secondary | ICD-10-CM | POA: Diagnosis not present

## 2015-07-05 DIAGNOSIS — N183 Chronic kidney disease, stage 3 unspecified: Secondary | ICD-10-CM

## 2015-07-05 NOTE — Progress Notes (Signed)
Patient ID: Amy Martin, female   DOB: 04-30-1919, 82 y.o.   MRN: [id number]      Nursing Home Location:  [place] of Service: SNF (31)  PCP: Hennie Duos, MD  No Known Allergies This is an acute visit.  Chief complaint-acute visit follow-up hypertension   HPI:  Patient is a 82 y.o. female seen today at [facility] for an acute visits.  For follow-up of hypertension She has on numerous agents including Lopressor 25 mg twice a day and hydralazine 100 mg every 6 hours this was recently started since her lisinopril was discontinued-lisinopril was discontinued secondary to some lower lip edema which appears to have resolved.  Recent blood pressures noted 117/56-120/68-123/62-I took it manually today and got 140/80-this appears to be relatively well controlled despite being on the Lopressor.  Pharmacy did leave a note though that the hydralazine dose is rather high at 100 mg every 6 hours-recommended maximum dose apparently is 300 mg every 6 hours. Limitation appears stable with no further reports of lip swelling-she is sitting in her wheelchair appears to be at baseline comfortable--no complaints of chest pain or shortness of breath or discomfort       Review of Systems:  Review of Systems  Unable to perform ROS: Dementia  please see history of present illness   Past Medical History  Diagnosis Date  . Cancer ([place])   . Stroke ([place])   . Diabetes mellitus   . Dementia   . Hypertension   . Hyperlipidemia   . Depression   . GERD (gastroesophageal reflux disease)    No past surgical history on file. Social History:   reports that she quit smoking about 36 years ago. Her smoking use included Cigarettes. She does not have any smokeless tobacco history on file. She reports that she does not drink alcohol. Her drug history is not on file.  No family history on file.  Medications: Patient's Medications  New  Prescriptions   No medications on file  Previous Medications   ACETAMINOPHEN (TYLENOL) 325 MG TABLET    Take 650 mg by mouth every 4 (four) hours as needed. For pain   AMBULATORY NON FORMULARY MEDICATION    Med Pass: Give 4 oz by mouth QID   ASPIRIN EC 325 MG TABLET    Take 325 mg by mouth daily. For CVA   ENSURE (ENSURE)    Take 1 Can by mouth 2 (two) times daily.   FERROUS SULFATE 325 (65 FE) MG TABLET    Take 325 mg by mouth daily with breakfast. For iron deficiency   HYDRALAZINE (APRESOLINE) 50 MG TABLET    Take one tablet by mouth every 6 hours for hypertension   METOPROLOL (LOPRESSOR) 50 MG TABLET    Take 25 mg by mouth 2 (two) times daily.    MULTIPLE VITAMIN (MULTIVITAMIN WITH MINERALS) TABS    Take 1 tablet by mouth daily.   NITROGLYCERIN (NITROSTAT) 0.4 MG SL TABLET    Place 0.4 mg under the tongue every 5 (five) minutes as needed for chest pain.   OMEPRAZOLE (PRILOSEC) 20 MG CAPSULE    Take 20 mg by mouth daily before supper. For reflux   POLYETHYLENE GLYCOL (MIRALAX / GLYCOLAX) PACKET    Take 17 g by mouth daily. For constipation   POTASSIUM CHLORIDE (KLOR-CON) 20 MEQ PACKET    Take 20 mEq by mouth daily.   SENNA-DOCUSATE (SENOKOT-S) 8.6-50 MG PER TABLET  Take 1 tablet by mouth daily. Hold for loose stools  Modified Medications   No medications on file  Discontinued Medications   No medications on file     Physical Exam: Filed Vitals:   07/05/15 2141  BP: 140/80  Pulse: 80  Temp: 97.5 F (36.4 C)  Resp: 20    Physical Exam  Constitutional: She appears well-developed. No distress.  HENT:  Head: Normocephalic and atraumatic.  Mouth/Throat: Oropharynx is clear and moist. No oropharyngeal exudat Lower lip did not appear remarkable in regards to edema  Eyes: EOM are normal. Pupils are equal, round, and reactive to light.  Neck: Neck supple.  Cardiovascular: Normal rate, regular rhythm and normal heart sounds.   Pulmonary/Chest: Effort normal and breath sounds  normal.  Abdominal: Soft. Bowel sounds are normal.  Musculoskeletal: She exhibits no edema or tenderness.  Neurological: She is alert.  Skin: Skin is warm.  Psychiatric: She has a normal mood and affect.  Vitals reviewed.   Labs reviewed  06/20/2015.  WBC 5.0 hemoglobin 10.6 platelets 261.  Sodium 144 potassium 4.2 BUN 29 creatinine 1.15.   05/10/2015.  Folate 7.4.  TSH 0.75.  B12 429.  05/07/2015.  WBC 6.3 hemoglobin 10.2 platelets 219.  : Basic Metabolic Panel:  Recent Labs  03/13/15 04/20/15  NA 142 142  K 3.8 4.2  BUN 26* 25*  CREATININE 1.1 1.0   Liver Function Tests:  Recent Labs  04/20/15  AST 19  ALT 9  ALKPHOS 50   No results for input(s): LIPASE, AMYLASE in the last 8760 hours. No results for input(s): AMMONIA in the last 8760 hours. CBC:  Recent Labs  03/13/15 04/20/15 05/08/15  WBC 3.9 4.1 6.3  HGB 9.1* 8.8* 10.2*  HCT 28* 27* 31*  PLT 177 172 219   TSH:  Recent Labs  05/10/15  TSH 0.75   A1C: Lab Results  Component Value Date   HGBA1C 5.9* 12/03/2013   Lipid Panel: No results for input(s): CHOL, HDL, LDLCALC, TRIG, CHOLHDL, LDLDIRECT in the last 8760 hours.   Folate 05/10/15:     7.4  Vitamin B12  05/10/15:    429  Assessment and plan.    #1 i Hypertension-this appears to be relatively well controlled here again lisinopril discontinued because of lip swelling-hydralazine dose however is a bit too much-will decrease this to 75 mg every 6 hours and monitor her blood pressure twice a day with a log in provider book for review next week.  Also will update a metabolic panel next lab day       2--Other iron deficiency anemias Hgb improved on labs from feb. conts on iron daily--CBC shows stability with a hemoglobin of 10.6 on lab done March 22    3 CKD (chronic kidney disease) stage 3, GFR 30-59 ml/min BUN/Cr stable in January. To stay hydrated and avoid NSAIDs Updated BMP shows stability with a BUN of 29 creatinine  1.15       [id number]

## 2015-07-10 LAB — BASIC METABOLIC PANEL
BUN: 28 mg/dL — AB (ref 4–21)
Creatinine: 1.1 mg/dL (ref 0.5–1.1)
GLUCOSE: 87 mg/dL
Potassium: 4.2 mmol/L (ref 3.4–5.3)
Sodium: 141 mmol/L (ref 137–147)

## 2015-07-13 ENCOUNTER — Non-Acute Institutional Stay (SKILLED_NURSING_FACILITY): Payer: Medicare Other | Admitting: Internal Medicine

## 2015-07-13 ENCOUNTER — Encounter: Payer: Self-pay | Admitting: Internal Medicine

## 2015-07-13 DIAGNOSIS — I69191 Dysphagia following nontraumatic intracerebral hemorrhage: Secondary | ICD-10-CM | POA: Insufficient documentation

## 2015-07-13 NOTE — Progress Notes (Signed)
MRN: [id number] Name: Amy Martin  Sex: female Age: 82 y.o. DOB: 1919-05-12  [place] #: [facility] Facility/Room: Level Of Care: SNF Provider: Inocencio Homes D Emergency Contacts: Extended Emergency Contact Information Primary Emergency Contact: Berkland,[name] Address: [address]          [place],  [address] [place] Phone: [phone] Mobile Phone: [phone] Relation: Son  Code Status:   Allergies: Review of patient's allergies indicates no known allergies.  Chief Complaint  Patient presents with  . Acute Visit    HPI: Patient is 82 y.o. female with dementia and dysphagia who is being seen today for dysphagia. The rehab manager told me that she was going to be dropped form ST 2/2 POA and family always bringing inappropriate food. ST told me that the POA told her that he and the family were her doctors ( no actual degrees are present) and they would tell us when she needed something. She also knew that pt didn't eat SNF food because she waited for family to bring it. She needs pureed and honey thick. I went to see pt as I suspect I will be signing a dysphagia waiver soon.  Past Medical History  Diagnosis Date  . Cancer ([place])   . Stroke ([place])   . Diabetes mellitus   . Dementia   . Hypertension   . Hyperlipidemia   . Depression   . GERD (gastroesophageal reflux disease)     History reviewed. No pertinent past surgical history.    Medication List       This list is accurate as of: 07/13/15 10:17 PM.  Always use your most recent med list.               acetaminophen 325 MG tablet  Commonly known as:  TYLENOL  Take 650 mg by mouth every 4 (four) hours as needed. For pain     AMBULATORY NON FORMULARY MEDICATION  Med Pass: Give 4 oz by mouth QID     aspirin EC 325 MG tablet  Take 325 mg by mouth daily. For CVA     ENSURE  Take 1 Can by mouth 2 (two) times daily.     ferrous sulfate 325 (65 FE) MG tablet  Take 325 mg by mouth  daily with breakfast. For iron deficiency     hydrALAZINE 50 MG tablet  Commonly known as:  APRESOLINE  Take one tablet by mouth every 6 hours for hypertension     metoprolol 50 MG tablet  Commonly known as:  LOPRESSOR  Take 25 mg by mouth 2 (two) times daily.     multivitamin with minerals Tabs tablet  Take 1 tablet by mouth daily.     nitroGLYCERIN 0.4 MG SL tablet  Commonly known as:  NITROSTAT  Place 0.4 mg under the tongue every 5 (five) minutes as needed for chest pain.     omeprazole 20 MG capsule  Commonly known as:  PRILOSEC  Take 20 mg by mouth daily before supper. For reflux     polyethylene glycol packet  Commonly known as:  MIRALAX / GLYCOLAX  Take 17 g by mouth daily. For constipation     potassium chloride 20 MEQ packet  Commonly known as:  KLOR-CON  Take 20 mEq by mouth daily.     senna-docusate 8.6-50 MG tablet  Commonly known as:  Senokot-S  Take 1 tablet by mouth daily. Hold for loose stools        No orders of  the defined types were placed in this encounter.    Immunization History  Administered Date(s) Administered  . PPD Test 01/12/2012  . Pneumococcal Polysaccharide-23 12/04/2013    Social History  Substance Use Topics  . Smoking status: Former Smoker    Types: Cigarettes    Quit date: 01/04/1979  . Smokeless tobacco: Not on file  . Alcohol Use: No    Review of Systems  UTO 2/2 dementia; nursing without cocnermns; ST with concerns as per HPI    Filed Vitals:   07/13/15 1255  BP: 151/65  Pulse: 57  Temp: 97.8 F (36.6 C)  Resp: 16    Physical Exam  GENERAL APPEARANCE: Alert, non conversant, No acute distress  SKIN: No diaphoresis rash HEENT: Unremarkable RESPIRATORY: Breathing is even, unlabored. Lung sounds are clear   CARDIOVASCULAR: Heart RRR no murmurs, rubs or gallops. No peripheral edema  GASTROINTESTINAL: Abdomen is soft, non-tender, not distended w/ normal bowel sounds.  GENITOURINARY: Bladder non tender, not  distended  MUSCULOSKELETAL: No abnormal joints or musculature NEUROLOGIC: Cranial nerves 2-12 grossly intact. Moves all extremities PSYCHIATRIC: dementia, no behavioral issues  Patient Active Problem List   Diagnosis Date Noted  . Dysphagia following nontraumatic intracerebral hemorrhage 07/13/2015  . Lip edema 06/18/2015  . History of CVA (cerebrovascular accident) 03/12/2015  . Cough 01/01/2015  . History of stroke 05/08/2014  . Constipation 02/27/2014  . Thrush 12/11/2013  . Protein-calorie malnutrition, severe ([place]) 12/04/2013  . Metabolic encephalopathy [id number]  . Seizure ([place]) 12/02/2013  . Esophageal reflux 07/22/2013  . CKD (chronic kidney disease) stage 3, GFR 30-59 ml/min 01/14/2013  . Anemia 11/02/2012  . Hemorrhoid 11/02/2012  . 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 Senile dementia, uncomplicated [id number]  . Iron deficiency anemia 08/13/2012  . HTN (hypertension) 01/06/2012  . CVA (cerebral infarction) 01/04/2012    CBC    Component Value Date/Time   WBC 6.3 05/08/2015   WBC 3.8* 12/03/2013 0432   RBC 3.73* 12/03/2013 0432   HGB 10.2* 05/08/2015   HCT 31* 05/08/2015   PLT 219 05/08/2015   MCV 87.1 12/03/2013 0432   LYMPHSABS 1.2 12/02/2013 1300   MONOABS 0.4 12/02/2013 1300   EOSABS 0.2 12/02/2013 1300   BASOSABS 0.0 12/02/2013 1300    CMP     Component Value Date/Time   NA 142 04/20/2015   NA 138 12/03/2013 0432   K 4.2 04/20/2015   CL 105 12/03/2013 0432   CO2 24 12/03/2013 0432   GLUCOSE 111* 12/03/2013 0432   BUN 25* 04/20/2015   BUN 18 12/03/2013 0432   CREATININE 1.0 04/20/2015   CREATININE 0.82 12/03/2013 0432   CALCIUM 9.1 12/03/2013 0432   PROT 6.9 12/02/2013 1300   ALBUMIN 2.9* 12/02/2013 1300   AST 19 04/20/2015   ALT 9 04/20/2015   ALKPHOS 50 04/20/2015   BILITOT 0.2* 12/02/2013 1300   GFRNONAA 60* 12/03/2013 0432   GFRAA 70* 12/03/2013 0432    Assessment and Plan  Dysphagia following nontraumatic  intracerebral hemorrhage I spoke with pt's son from [place] today with daughters in the room as well . He seemed to understand that food was going into her lungs and that multiple PNA's  was the inevitable result, possibly death. I told him they needed to discuss this, and their decision , whatever it was, was fine as long as they understood the consequences tp the pt . I told him they needed to consider no hospitalizations as each hospitalization would be a big hit  to is mothers brain and functioning. Apparently the family  did discuss this . POA signed the dysphagia waiver, as did I.    Time spent > 35 min; > 50% of time with patient was spent reviewing records, labs, tests and studies, counseling and developing plan of care  Hennie Duos, MD

## 2015-07-13 NOTE — Assessment & Plan Note (Addendum)
I spoke with pt's son from [place] today with daughters in the room as well . He seemed to understand that food was going into her lungs and that multiple PNA's  was the inevitable result, possibly death. I told him they needed to discuss this, and their decision , whatever it was, was fine as long as they understood the consequences tp the pt . I told him they needed to consider no hospitalizations as each hospitalization would be a big hit to is mothers brain and functioning. Apparently the family  did discuss this . POA signed the dysphagia waiver, as did I.

## 2015-08-17 ENCOUNTER — Encounter: Payer: Self-pay | Admitting: Internal Medicine

## 2015-08-17 ENCOUNTER — Non-Acute Institutional Stay (SKILLED_NURSING_FACILITY): Payer: Medicare Other | Admitting: Internal Medicine

## 2015-08-17 DIAGNOSIS — I10 Essential (primary) hypertension: Secondary | ICD-10-CM | POA: Diagnosis not present

## 2015-08-17 DIAGNOSIS — N183 Chronic kidney disease, stage 3 unspecified: Secondary | ICD-10-CM

## 2015-08-17 DIAGNOSIS — K219 Gastro-esophageal reflux disease without esophagitis: Secondary | ICD-10-CM | POA: Diagnosis not present

## 2015-08-17 NOTE — Progress Notes (Signed)
MRN: [id number] Name: Amy Martin  Sex: female Age: 82 y.o. DOB: 1919-07-20  [place] #: [facility] Facility/Room:305 W Level Of Care: SNF Provider: Noah Delaine. Sheppard Coil, MD Emergency Contacts: Extended Emergency Contact Information Primary Emergency Contact: Uhlig,[name] Address: [address]          [place], [place] [address] Johnnette Litter of [place] Phone: [phone] Mobile Phone: [phone] Relation: Son  Code Status: Full Code  Allergies: Review of patient's allergies indicates no known allergies.  Chief Complaint  Patient presents with  . Medical Management of Chronic Issues    Routine Visit    HPI: Patient is 82 y.o. female who is being seen for routine issues of HTN, GERD and CKD3.  Past Medical History  Diagnosis Date  . Cancer ([place])   . Stroke ([place])   . Diabetes mellitus   . Dementia   . Hypertension   . Hyperlipidemia   . Depression   . GERD (gastroesophageal reflux disease)     History reviewed. No pertinent past surgical history.    Medication List       This list is accurate as of: 08/17/15 11:59 PM.  Always use your most recent med list.               acetaminophen 325 MG tablet  Commonly known as:  TYLENOL  Take 650 mg by mouth every 4 (four) hours as needed. For pain     AMBULATORY NON FORMULARY MEDICATION  [facility]: Give 4 oz by mouth QID     aspirin EC 325 MG tablet  Take 325 mg by mouth daily. For CVA     ENSURE  Take 1 Can by mouth 2 (two) times daily.     ferrous sulfate 325 (65 FE) MG tablet  Take 325 mg by mouth daily with breakfast. For iron deficiency     hydrALAZINE 25 MG tablet  Commonly known as:  APRESOLINE  Take 25 mg by mouth 4 (four) times daily. Give 1 tablet (25 mg) by mouth along with Hydralazine 50 mg for (75 mg) QID (Q6hr)     hydrALAZINE 50 MG tablet  Commonly known as:  APRESOLINE  Take 50 mg by mouth 4 (four) times daily. Give 1 table (50 mg) by mouth along with hydralazine 25 mg for (75 mg) QID  (Q6hr)     metoprolol 50 MG tablet  Commonly known as:  LOPRESSOR  Take 25 mg by mouth 2 (two) times daily. Hold for HR less than 55     multivitamin with minerals Tabs tablet  Take 1 tablet by mouth daily.     nitroGLYCERIN 0.4 MG SL tablet  Commonly known as:  NITROSTAT  Place 0.4 mg under the tongue every 5 (five) minutes as needed for chest pain. Do not exceed 3 doses     omeprazole 20 MG capsule  Commonly known as:  PRILOSEC  Take 20 mg by mouth daily before supper. For reflux     polyethylene glycol packet  Commonly known as:  MIRALAX / GLYCOLAX  Take 17 g by mouth daily. For constipation     potassium chloride 20 MEQ packet  Commonly known as:  KLOR-CON  Take 20 mEq by mouth daily.     senna-docusate 8.6-50 MG tablet  Commonly known as:  Senokot-S  Take 1 tablet by mouth daily. Hold for loose stools        Meds ordered this encounter  Medications  . hydrALAZINE (APRESOLINE) 25 MG tablet  Sig: Take 25 mg by mouth 4 (four) times daily. Give 1 tablet (25 mg) by mouth along with Hydralazine 50 mg for (75 mg) QID (Q6hr)  . hydrALAZINE (APRESOLINE) 50 MG tablet    Sig: Take 50 mg by mouth 4 (four) times daily. Give 1 table (50 mg) by mouth along with hydralazine 25 mg for (75 mg) QID (Q6hr)    Immunization History  Administered Date(s) Administered  . PPD Test 01/12/2012  . Pneumococcal Polysaccharide-23 12/04/2013    Social History  Substance Use Topics  . Smoking status: Former Smoker    Types: Cigarettes    Quit date: 01/04/1979  . Smokeless tobacco: Not on file  . Alcohol Use: No    Review of Systems  UTO 2/2 dementia; nursing without concerns    Filed Vitals:   08/17/15 1526  BP: 134/68  Pulse: 87  Temp: 97.8 F (36.6 C)  Resp: 20    Physical Exam  GENERAL APPEARANCE: Alert, conversant, No acute distress  SKIN: No diaphoresis rash HEENT: Unremarkable RESPIRATORY: Breathing is even, unlabored. Lung sounds are clear   CARDIOVASCULAR:  Heart RRR no murmurs, rubs or gallops. No peripheral edema  GASTROINTESTINAL: Abdomen is soft, non-tender, not distended w/ normal bowel sounds.  GENITOURINARY: Bladder not distended, not distended  MUSCULOSKELETAL: No abnormal joints or musculature NEUROLOGIC: Cranial nerves 2-12 grossly intact. Moves all extremities PSYCHIATRIC: dementia, no behavioral issues  Patient Active Problem List   Diagnosis Date Noted  . Dysphagia following nontraumatic intracerebral hemorrhage 07/13/2015  . Lip edema 06/18/2015  . History of CVA (cerebrovascular accident) 03/12/2015  . Cough 01/01/2015  . History of stroke 05/08/2014  . Constipation 02/27/2014  . Thrush 12/11/2013  . Protein-calorie malnutrition, severe ([place]) 12/04/2013  . Metabolic encephalopathy 99991111  . Seizure ([place]) 12/02/2013  . Esophageal reflux 07/22/2013  . CKD (chronic kidney disease) stage 3, GFR 30-59 ml/min 01/14/2013  . Anemia 11/02/2012  . Hemorrhoid 11/02/2012  . 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 Senile dementia, uncomplicated [id number]  . Iron deficiency anemia 08/13/2012  . HTN (hypertension) 01/06/2012  . CVA (cerebral infarction) 01/04/2012    CBC    Component Value Date/Time   WBC 5.0 06/20/2015   WBC 3.8* 12/03/2013 0432   RBC 3.73* 12/03/2013 0432   HGB 10.6* 06/20/2015   HCT 33* 06/20/2015   PLT 261 06/20/2015   MCV 87.1 12/03/2013 0432   LYMPHSABS 1.2 12/02/2013 1300   MONOABS 0.4 12/02/2013 1300   EOSABS 0.2 12/02/2013 1300   BASOSABS 0.0 12/02/2013 1300    CMP     Component Value Date/Time   NA 141 07/10/2015   NA 138 12/03/2013 0432   K 4.2 07/10/2015   CL 105 12/03/2013 0432   CO2 24 12/03/2013 0432   GLUCOSE 111* 12/03/2013 0432   BUN 28* 07/10/2015   BUN 18 12/03/2013 0432   CREATININE 1.1 07/10/2015   CREATININE 0.82 12/03/2013 0432   CALCIUM 9.1 12/03/2013 0432   PROT 6.9 12/02/2013 1300   ALBUMIN 2.9* 12/02/2013 1300   AST 13 06/20/2015   ALT 7  06/20/2015   ALKPHOS 67 06/20/2015   BILITOT 0.2* 12/02/2013 1300   GFRNONAA 60* 12/03/2013 0432   GFRAA 70* 12/03/2013 0432    Assessment and Plan  HTN (hypertension) Controlled well on hydralazine 75 mg q 6 and metoprolol 25 mg BID; cont to monitor  Esophageal reflux No signs or sx; cont prilosec 20 mg daily  CKD (chronic kidney disease) stage 3, GFR 30-59  ml/min CrCl 34.6, Cr 1.1 ; stable; will monitor at intervals    Webb Silversmith D. Sheppard Coil, MD

## 2015-08-18 ENCOUNTER — Encounter: Payer: Self-pay | Admitting: Internal Medicine

## 2015-08-18 NOTE — Assessment & Plan Note (Signed)
No signs or sx; cont prilosec 20 mg daily

## 2015-08-18 NOTE — Assessment & Plan Note (Signed)
Controlled well on hydralazine 75 mg q 6 and metoprolol 25 mg BID; cont to monitor

## 2015-08-18 NOTE — Assessment & Plan Note (Signed)
CrCl 34.6, Cr 1.1 ; stable; will monitor at intervals

## 2015-08-23 ENCOUNTER — Non-Acute Institutional Stay (SKILLED_NURSING_FACILITY): Payer: Medicare Other | Admitting: Internal Medicine

## 2015-08-23 ENCOUNTER — Encounter: Payer: Self-pay | Admitting: Internal Medicine

## 2015-08-23 DIAGNOSIS — F0390 Unspecified dementia without behavioral disturbance: Secondary | ICD-10-CM

## 2015-08-23 DIAGNOSIS — D509 Iron deficiency anemia, unspecified: Secondary | ICD-10-CM

## 2015-08-23 NOTE — Progress Notes (Signed)
Location:  [facility] Room Number: 305/W Place of Service:  SNF [phone]) Provider:  Henreitta Leber, MD  Patient Care Team: Hennie Duos, MD as PCP - General (Internal Medicine)  Extended Emergency Contact Information Primary Emergency Contact: Schlie,[name] Address: [address]          [place],  [address] Amy Martin of [place] Phone: [phone] Mobile Phone: [phone] Relation: Son  Code Status:  Full Code Goals of care: Advanced Directive information Advanced Directives 08/23/2015  Does patient have an advance directive? Yes  Type of Advance Directive [place]  Does patient want to make changes to advanced directive? No - Patient declined  Copy of advanced directive(s) in chart? Yes  Would patient like information on creating an advanced directive? -    Chief complaint-visit follow-up anemia-   HPI:  Pt is a 82 y.o. female seen today for follow-up of anemia-as well as dementia-with apparently some weight loss over the last few weeks  Patient continues to be relatively at her baseline with a history of dementia-according nursing staff she has a spotty appetite but eats what her family brings her fairly well.  She has no complaints today her vital signs are stable.  I do know previously she has had some weight loss although that data currently is unavailable.  She is not complaining of any dysphagia or trouble swallowing no abdominal discomfort no nausea or vomiting--She does continue on Prilosec with a history of GERD  She does have a history of iron deficiency anemia in fact hemoglobin was 8.8 back in January  on lab done in March it was up to 10.6 she is on iron-we will update this at this is encouraging.  In regards to renal function creatinine of 1.1 BUN of 28 back in April. To be relatively within her baseline we will update this as well     Past Medical History    Diagnosis Date  . Cancer ([place])   . Stroke ([place])   . Diabetes mellitus   . Dementia   . Hypertension   . Hyperlipidemia   . Depression   . GERD (gastroesophageal reflux disease)    No past surgical history on file.  No Known Allergies    Medication List       This list is accurate as of: 08/23/15  3:19 PM.  Always use your most recent med list.               acetaminophen 325 MG tablet  Commonly known as:  TYLENOL  Take 650 mg by mouth every 4 (four) hours as needed. For pain     AMBULATORY NON FORMULARY MEDICATION  [facility]: Give 4 oz by mouth QID     aspirin EC 325 MG tablet  Take 325 mg by mouth daily. For CVA     ENSURE  Take 1 Can by mouth 2 (two) times daily.     ferrous sulfate 325 (65 FE) MG tablet  Take 325 mg by mouth daily with breakfast. For iron deficiency     hydrALAZINE 25 MG tablet  Commonly known as:  APRESOLINE  Take 25 mg by mouth 4 (four) times daily. Give 1 tablet (25 mg) by mouth along with Hydralazine 50 mg for (75 mg) QID (Q6hr)     hydrALAZINE 50 MG tablet  Commonly known as:  APRESOLINE  Take 50 mg by mouth 4 (four) times daily. Give 1  table (50 mg) by mouth along with hydralazine 25 mg for (75 mg) QID (Q6hr)     metoprolol 50 MG tablet  Commonly known as:  LOPRESSOR  Take 25 mg by mouth 2 (two) times daily. Hold for HR less than 55     multivitamin with minerals Tabs tablet  Take 1 tablet by mouth daily.     nitroGLYCERIN 0.4 MG SL tablet  Commonly known as:  NITROSTAT  Place 0.4 mg under the tongue every 5 (five) minutes as needed for chest pain. Do not exceed 3 doses     omeprazole 20 MG capsule  Commonly known as:  PRILOSEC  Take 20 mg by mouth daily before supper. For reflux     polyethylene glycol packet  Commonly known as:  MIRALAX / GLYCOLAX  Take 17 g by mouth daily. For constipation     potassium chloride 20 MEQ packet  Commonly known as:  KLOR-CON  Take 20 mEq by mouth daily.     senna-docusate 8.6-50 MG  tablet  Commonly known as:  Senokot-S  Take 1 tablet by mouth daily. Hold for loose stools        Review of Systems again this is limited somewhat to dementia-please see history of present illness.  In general no complete fever chills apparently she has had some weight loss.  Respiration is not complaining of shortness breath or cough.  Cardiac no chest pain.  GI is not complaining of any abdominal discomfort nausea vomiting diarrhea or constipation.  Muscle skeletal ambulates about the hallway in her wheelchair she does fairly well with this is not complaining of joint pain.  Neurologic is not complaining of dizziness or headache.  Psych she does have a history of dementia I do not really see significant behaviors however  Immunization History  Administered Date(s) Administered  . PPD Test 01/12/2012  . Pneumococcal Polysaccharide-23 12/04/2013   Pertinent  Health Maintenance Due  Topic Date Due  . DEXA SCAN  03/31/2016 (Originally 07/13/1986)  . INFLUENZA VACCINE  06/27/2016 (Originally 10/30/2015)  . PNA vac Low Risk Adult (2 of 2 - PCV13) 06/27/2016 (Originally 12/05/2014)   No flowsheet data found. Functional Status Survey:    Filed Vitals:   08/23/15 1512  BP: 113/60  Pulse: 71  Temp: 97.8 F (36.6 C)  TempSrc: Oral  Resp: 20  Height: 5\' 4"  (1.626 m)  Weight: 100 lb (45.36 kg)   Body mass index is 17.16 kg/(m^2). Physical Exam   Gen. this is a pleasant elderly female in no distress sitting comfortably in her wheelchair.  Her skin is warm and dry.  Eyes she has prescription lenses pupils appear reactive to light visual acuity appears grossly intact.  Heart is regular rate and rhythm without murmur gallop or rub she does not really have significant lower extremity edema.  Chest is clear to auscultation no labored breathing.  Abdomen soft soft nontender with positive bowel sounds.  Muscle skeletal does move all extremities 4 with arthritic changes  diffuse.  Psych she is oriented to self pleasant and cooperative does follow simple verbal commands-  Labs reviewed:  Recent Labs  04/20/15 06/20/15 07/10/15  NA 142 144 141  K 4.2 4.2 4.2  BUN 25* 29* 28*  CREATININE 1.0 1.2* 1.1    Recent Labs  04/20/15 06/20/15  AST 19 13  ALT 9 7  ALKPHOS 50 67    Recent Labs  04/20/15 05/08/15 06/20/15  WBC 4.1 6.3 5.0  HGB 8.8* 10.2* 10.6*  HCT 27* 31* 33*  PLT 172 219 261   Lab Results  Component Value Date   TSH 0.75 05/10/2015   Lab Results  Component Value Date   HGBA1C 5.7 06/27/2015   Lab Results  Component Value Date   CHOL 149 01/04/2012   HDL 67 01/04/2012   LDLCALC 70 01/04/2012   TRIG 60 01/04/2012   CHOLHDL 2.2 01/04/2012    Significant Diagnostic Results in last 30 days:  No results found.  Assessment/Plan #1 anemia of iron deficiency-she is on iron supplementation hemoglobin showed improvement back in March will update this.  #2 history of dementia with possible weight loss-would like to get a CMP to see were her electrolytes renal function and albumin stand-clinically she appears to be at her baseline wheeling about the facility in her wheelchair and continues to be pleasant and cooperative-apparently she does eat better when the family brings her food-dietary continues to follow her--will await lab results  [place], [place], [place]

## 2015-08-31 LAB — BASIC METABOLIC PANEL
BUN: 22 mg/dL — AB (ref 4–21)
CREATININE: 1.1 mg/dL (ref 0.5–1.1)
Glucose: 155 mg/dL
Potassium: 3.9 mmol/L (ref 3.4–5.3)
SODIUM: 141 mmol/L (ref 137–147)

## 2015-08-31 LAB — CBC AND DIFFERENTIAL
HEMATOCRIT: 33 % — AB (ref 36–46)
Hemoglobin: 10.6 g/dL — AB (ref 12.0–16.0)
PLATELETS: 265 10*3/uL (ref 150–399)
WBC: 3.8 10^3/mL

## 2015-09-19 ENCOUNTER — Inpatient Hospital Stay (HOSPITAL_COMMUNITY)
Admission: EM | Admit: 2015-09-19 | Discharge: 2015-09-22 | DRG: 193 | Disposition: A | Discharge status: Skilled Nursing Facility | Payer: Medicare Other | Attending: Internal Medicine | Admitting: Internal Medicine

## 2015-09-19 ENCOUNTER — Encounter: Payer: Self-pay | Admitting: Internal Medicine

## 2015-09-19 ENCOUNTER — Other Ambulatory Visit: Payer: Self-pay

## 2015-09-19 ENCOUNTER — Emergency Department (HOSPITAL_COMMUNITY): Payer: Medicare Other

## 2015-09-19 ENCOUNTER — Non-Acute Institutional Stay (SKILLED_NURSING_FACILITY): Payer: Medicare Other | Admitting: Internal Medicine

## 2015-09-19 DIAGNOSIS — I69359 Hemiplegia and hemiparesis following cerebral infarction affecting unspecified side: Secondary | ICD-10-CM

## 2015-09-19 DIAGNOSIS — R509 Fever, unspecified: Secondary | ICD-10-CM | POA: Diagnosis present

## 2015-09-19 DIAGNOSIS — E860 Dehydration: Secondary | ICD-10-CM | POA: Diagnosis present

## 2015-09-19 DIAGNOSIS — N183 Chronic kidney disease, stage 3 unspecified: Secondary | ICD-10-CM | POA: Diagnosis present

## 2015-09-19 DIAGNOSIS — N289 Disorder of kidney and ureter, unspecified: Secondary | ICD-10-CM

## 2015-09-19 DIAGNOSIS — E785 Hyperlipidemia, unspecified: Secondary | ICD-10-CM | POA: Diagnosis present

## 2015-09-19 DIAGNOSIS — I129 Hypertensive chronic kidney disease with stage 1 through stage 4 chronic kidney disease, or unspecified chronic kidney disease: Secondary | ICD-10-CM | POA: Diagnosis present

## 2015-09-19 DIAGNOSIS — Z681 Body mass index (BMI) 19 or less, adult: Secondary | ICD-10-CM

## 2015-09-19 DIAGNOSIS — R4182 Altered mental status, unspecified: Secondary | ICD-10-CM | POA: Diagnosis present

## 2015-09-19 DIAGNOSIS — J189 Pneumonia, unspecified organism: Secondary | ICD-10-CM | POA: Diagnosis not present

## 2015-09-19 DIAGNOSIS — K219 Gastro-esophageal reflux disease without esophagitis: Secondary | ICD-10-CM | POA: Diagnosis present

## 2015-09-19 DIAGNOSIS — G9341 Metabolic encephalopathy: Secondary | ICD-10-CM | POA: Diagnosis present

## 2015-09-19 DIAGNOSIS — E43 Unspecified severe protein-calorie malnutrition: Secondary | ICD-10-CM | POA: Diagnosis present

## 2015-09-19 DIAGNOSIS — E1122 Type 2 diabetes mellitus with diabetic chronic kidney disease: Secondary | ICD-10-CM | POA: Diagnosis present

## 2015-09-19 DIAGNOSIS — B9620 Unspecified Escherichia coli [E. coli] as the cause of diseases classified elsewhere: Secondary | ICD-10-CM | POA: Diagnosis present

## 2015-09-19 DIAGNOSIS — R402142 Coma scale, eyes open, spontaneous, at arrival to emergency department: Secondary | ICD-10-CM | POA: Diagnosis present

## 2015-09-19 DIAGNOSIS — R402222 Coma scale, best verbal response, incomprehensible words, at arrival to emergency department: Secondary | ICD-10-CM | POA: Diagnosis present

## 2015-09-19 DIAGNOSIS — Z7982 Long term (current) use of aspirin: Secondary | ICD-10-CM

## 2015-09-19 DIAGNOSIS — R402362 Coma scale, best motor response, obeys commands, at arrival to emergency department: Secondary | ICD-10-CM | POA: Diagnosis present

## 2015-09-19 DIAGNOSIS — Z66 Do not resuscitate: Secondary | ICD-10-CM | POA: Diagnosis present

## 2015-09-19 DIAGNOSIS — N390 Urinary tract infection, site not specified: Secondary | ICD-10-CM | POA: Diagnosis present

## 2015-09-19 DIAGNOSIS — Z87891 Personal history of nicotine dependence: Secondary | ICD-10-CM

## 2015-09-19 DIAGNOSIS — D649 Anemia, unspecified: Secondary | ICD-10-CM | POA: Diagnosis present

## 2015-09-19 DIAGNOSIS — D509 Iron deficiency anemia, unspecified: Secondary | ICD-10-CM | POA: Diagnosis present

## 2015-09-19 DIAGNOSIS — I10 Essential (primary) hypertension: Secondary | ICD-10-CM | POA: Diagnosis present

## 2015-09-19 DIAGNOSIS — F0390 Unspecified dementia without behavioral disturbance: Secondary | ICD-10-CM | POA: Diagnosis present

## 2015-09-19 LAB — COMPREHENSIVE METABOLIC PANEL
ALBUMIN: 2.8 g/dL — AB (ref 3.5–5.0)
ALK PHOS: 70 U/L (ref 38–126)
ALT: 12 U/L — AB (ref 14–54)
AST: 23 U/L (ref 15–41)
Anion gap: 8 (ref 5–15)
BUN: 37 mg/dL — AB (ref 6–20)
CHLORIDE: 108 mmol/L (ref 101–111)
CO2: 26 mmol/L (ref 22–32)
CREATININE: 1.36 mg/dL — AB (ref 0.44–1.00)
Calcium: 9.9 mg/dL (ref 8.9–10.3)
GFR calc Af Amer: 37 mL/min — ABNORMAL LOW (ref >60)
GFR calc non Af Amer: 32 mL/min — ABNORMAL LOW (ref >60)
GLUCOSE: 171 mg/dL — AB (ref 65–99)
Potassium: 4.8 mmol/L (ref 3.5–5.1)
SODIUM: 142 mmol/L (ref 135–145)
Total Bilirubin: 0.6 mg/dL (ref 0.3–1.2)
Total Protein: 6.1 g/dL — ABNORMAL LOW (ref 6.5–8.1)

## 2015-09-19 LAB — BASIC METABOLIC PANEL
BUN: 37 mg/dL — AB (ref 4–21)
Creatinine: 1.4 mg/dL — AB (ref 0.5–1.1)
Glucose: 171 mg/dL
POTASSIUM: 4.8 mmol/L (ref 3.4–5.3)
SODIUM: 142 mmol/L (ref 137–147)

## 2015-09-19 LAB — CBC
HEMATOCRIT: 33.9 % — AB (ref 36.0–46.0)
Hemoglobin: 10.9 g/dL — ABNORMAL LOW (ref 12.0–15.0)
MCH: 27.1 pg (ref 26.0–34.0)
MCHC: 32.2 g/dL (ref 30.0–36.0)
MCV: 84.3 fL (ref 78.0–100.0)
PLATELETS: 205 10*3/uL (ref 150–400)
RBC: 4.02 MIL/uL (ref 3.87–5.11)
RDW: 15.8 % — AB (ref 11.5–15.5)
WBC: 8.3 10*3/uL (ref 4.0–10.5)

## 2015-09-19 LAB — URINALYSIS, ROUTINE W REFLEX MICROSCOPIC
GLUCOSE, UA: NEGATIVE mg/dL
Hgb urine dipstick: NEGATIVE
Ketones, ur: 15 mg/dL — AB
Nitrite: NEGATIVE
PH: 5 (ref 5.0–8.0)
PROTEIN: NEGATIVE mg/dL
SPECIFIC GRAVITY, URINE: 1.02 (ref 1.005–1.030)

## 2015-09-19 LAB — URINE MICROSCOPIC-ADD ON

## 2015-09-19 LAB — DIFFERENTIAL
BASOS ABS: 0 10*3/uL (ref 0.0–0.1)
BASOS PCT: 0 %
Eosinophils Absolute: 0 10*3/uL (ref 0.0–0.7)
Eosinophils Relative: 0 %
LYMPHS PCT: 5 %
Lymphs Abs: 0.4 10*3/uL — ABNORMAL LOW (ref 0.7–4.0)
Monocytes Absolute: 0.6 10*3/uL (ref 0.1–1.0)
Monocytes Relative: 7 %
NEUTROS ABS: 7.4 10*3/uL (ref 1.7–7.7)
Neutrophils Relative %: 88 %

## 2015-09-19 LAB — I-STAT TROPONIN, ED: Troponin i, poc: 0.01 ng/mL (ref 0.00–0.08)

## 2015-09-19 LAB — PROTIME-INR
INR: 1.33 (ref 0.00–1.49)
Prothrombin Time: 16.6 seconds — ABNORMAL HIGH (ref 11.6–15.2)

## 2015-09-19 LAB — CBC AND DIFFERENTIAL
HEMATOCRIT: 34 % — AB (ref 36–46)
HEMOGLOBIN: 10.9 g/dL — AB (ref 12.0–16.0)
Platelets: 205 10*3/uL (ref 150–399)
WBC: 8.3 10*3/mL

## 2015-09-19 LAB — HEPATIC FUNCTION PANEL: Bilirubin, Total: 0.6 mg/dL

## 2015-09-19 LAB — LIPASE, BLOOD: Lipase: 17 U/L (ref 11–51)

## 2015-09-19 MED ORDER — SODIUM CHLORIDE 0.9 % IV BOLUS (SEPSIS)
500.0000 mL | Freq: Once | INTRAVENOUS | Status: AC
  Administered 2015-09-19: 500 mL via INTRAVENOUS

## 2015-09-19 MED ORDER — DEXTROSE 5 % IV SOLN
1.0000 g | Freq: Once | INTRAVENOUS | Status: AC
  Administered 2015-09-19: 1 g via INTRAVENOUS
  Filled 2015-09-19: qty 10

## 2015-09-19 NOTE — ED Notes (Signed)
Patient is from [facility] with C/O lethargy. Patient not as verbal as normal nor as active.  Low grade fever reported. CBG - 220.  BP 138/85, P- 84

## 2015-09-19 NOTE — ED Notes (Signed)
Patient transported to CT 

## 2015-09-19 NOTE — Progress Notes (Signed)
MRN: [id number] Name: Amy Martin  Sex: female Age: 82 y.o. DOB: [birth year]  [facility] #:  Facility/Room: [facility] / [facility]: SNF Provider: Noah Delaine. Sheppard Coil, MD Emergency Contacts: Extended Emergency Contact Information Primary Emergency Contact: Boom,[name] Address: [address]          [place], [place] [address] Johnnette Litter of [place] Phone: [phone] Mobile Phone: [phone] Relation: Son  Code Status:Full Code   Allergies: Review of patient's allergies indicates no known allergies.  Chief Complaint  Patient presents with  . Acute Visit    Acute    HPI: Patient is 82 y.o. female who nursing asked me to see because pt is not herself today. She has no sx or signs. Pt is demented.  Past Medical History  Diagnosis Date  . Cancer ([place])   . Stroke ([place])   . Diabetes mellitus   . Dementia   . Hypertension   . Hyperlipidemia   . Depression   . GERD (gastroesophageal reflux disease)     No past surgical history on file.    Medication List       This list is accurate as of: 09/19/15  8:36 PM.  Always use your most recent med list.               acetaminophen 325 MG tablet  Commonly known as:  TYLENOL  Take 650 mg by mouth every 4 (four) hours as needed for mild pain.     AMBULATORY NON FORMULARY MEDICATION  [facility]: Give 4 oz by mouth QID     aspirin EC 325 MG tablet  Take 325 mg by mouth daily. For CVA     cefUROXime 250 MG tablet  Commonly known as:  CEFTIN  Take 1 tablet (250 mg total) by mouth 2 (two) times daily with a meal. Please give for 2 days then stop     ENSURE  Take 1 Can by mouth 2 (two) times daily.     ferrous sulfate 325 (65 FE) MG tablet  Take 325 mg by mouth daily with breakfast. For iron deficiency     hydrALAZINE 25 MG tablet  Commonly known as:  APRESOLINE  Take 25 mg by mouth 4 (four) times daily. Give 1 tablet (25 mg) by mouth along with Hydralazine 50 mg for (75 mg) QID  (Q6hr)     hydrALAZINE 50 MG tablet  Commonly known as:  APRESOLINE  Take 50 mg by mouth 4 (four) times daily. Give 1 table (50 mg) by mouth along with hydralazine 25 mg for (75 mg) QID (Q6hr)     metoprolol 50 MG tablet  Commonly known as:  LOPRESSOR  Take 25 mg by mouth 2 (two) times daily. Hold for HR less than 55     multivitamin with minerals Tabs tablet  Take 1 tablet by mouth daily.     nitroGLYCERIN 0.4 MG SL tablet  Commonly known as:  NITROSTAT  Place 0.4 mg under the tongue every 5 (five) minutes as needed for chest pain. Do not exceed 3 doses     omeprazole 20 MG capsule  Commonly known as:  PRILOSEC  Take 20 mg by mouth daily before supper. For reflux     polyethylene glycol packet  Commonly known as:  MIRALAX / GLYCOLAX  Take 17 g by mouth daily. For constipation     potassium chloride 20 MEQ packet  Commonly known as:  KLOR-CON  Take 20 mEq by mouth daily.  senna-docusate 8.6-50 MG tablet  Commonly known as:  Senokot-S  Take 1 tablet by mouth daily. Hold for loose stools        No orders of the defined types were placed in this encounter.    Immunization History  Administered Date(s) Administered  . PPD Test 01/12/2012  . Pneumococcal Polysaccharide-23 12/04/2013    Social History  Substance Use Topics  . Smoking status: Former Smoker    Types: Cigarettes    Quit date: 01/04/1979  . Smokeless tobacco: Not on file  . Alcohol Use: No    Review of Systems UTO 2/2 dementia; nursing with concerns per HPI    Filed Vitals:   09/19/15 1311  BP: 126/66  Pulse: 97  Temp: 100.7 F (38.2 C)  Resp: 16    Physical Exam  GENERAL APPEARANCE: Alert, non conversant, No acute distress  SKIN: No diaphoresis rash HEENT: Unremarkable RESPIRATORY: Breathing is even, unlabored. Lung sounds are clear ; RA O2 sat94% CARDIOVASCULAR: Heart RRR no murmurs, rubs or gallops. No peripheral edema  GASTROINTESTINAL: Abdomen is soft, non-tender, not distended  w/ normal bowel sounds.  GENITOURINARY: Bladder non tender, not distended  MUSCULOSKELETAL: No abnormal joints or musculature NEUROLOGIC: Cranial nerves 2-12 grossly intact. Moves all extremities PSYCHIATRIC: dementia, no behavioral issues  Patient Active Problem List   Diagnosis Date Noted  . Fever 09/20/2015  . Dehydration 09/20/2015  . Altered mental status 09/20/2015  . Dysphagia following nontraumatic intracerebral hemorrhage 07/13/2015  . Lip edema 06/18/2015  . History of CVA (cerebrovascular accident) 03/12/2015  . Cough 01/01/2015  . History of stroke 05/08/2014  . Constipation 02/27/2014  . Thrush 12/11/2013  . Protein-calorie malnutrition, severe ([place]) 12/04/2013  . Metabolic encephalopathy 99991111  . Seizure ([place]) 12/02/2013  . Esophageal reflux 07/22/2013  . CKD (chronic kidney disease) stage 3, GFR 30-59 ml/min 01/14/2013  . Anemia 11/02/2012  . Hemorrhoid 11/02/2012  . 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 Senile dementia, uncomplicated [id number]  . Iron deficiency anemia 08/13/2012  . Essential hypertension 01/06/2012  . CVA (cerebral infarction) 01/04/2012    CBC    Component Value Date/Time   WBC 6.9 09/21/2015 0244   WBC 3.8 08/31/2015   RBC 3.93 09/21/2015 0244   HGB 10.3* 09/21/2015 0244   HCT 32.5* 09/21/2015 0244   PLT 236 09/21/2015 0244   MCV 82.7 09/21/2015 0244   LYMPHSABS 0.4* 09/19/2015 2149   MONOABS 0.6 09/19/2015 2149   EOSABS 0.0 09/19/2015 2149   BASOSABS 0.0 09/19/2015 2149    CMP     Component Value Date/Time   NA 144 09/21/2015 0244   NA 141 08/31/2015   K 3.9 09/21/2015 0244   CL 112* 09/21/2015 0244   CO2 27 09/21/2015 0244   GLUCOSE 210* 09/21/2015 0244   BUN 28* 09/21/2015 0244   BUN 22* 08/31/2015   CREATININE 1.04* 09/21/2015 0244   CREATININE 1.1 08/31/2015   CALCIUM 9.6 09/21/2015 0244   PROT 6.1* 09/19/2015 2149   ALBUMIN 2.8* 09/19/2015 2149   AST 23 09/19/2015 2149   ALT 12* 09/19/2015  2149   ALKPHOS 70 09/19/2015 2149   BILITOT 0.6 09/19/2015 2149   GFRNONAA 45* 09/21/2015 0244   GFRAA 52* 09/21/2015 0244    Assessment and Plan  FEBRILE ILLNESS/MS CHANGE - with no clues; have ordered stat CXR, U/A, CBC, BMP and will treat based on results or af pt exhibits a change or symptoms   Time spent > 25 min;> 50% of time with  patient was spent reviewing records, labs, tests and studies, counseling and developing plan of care  Noah Delaine. Sheppard Coil, MD

## 2015-09-19 NOTE — ED Provider Notes (Signed)
CSN: [id number]     Arrival date & time 09/19/15  2036 History   First MD Initiated Contact with Patient 09/19/15 2039     Chief Complaint  Patient presents with  . Fatigue  5 caveat: Altered mental status HPI Patient presents to the emergency room for evaluation of lethargy. The patient is a resident of a nursing facility. According to the records at baseline she usually is pleasant, demented but able to carry on a brief conversation. According to the nursing facility the patient has been listless all day today. She has not been talking as much as usual. She also has not been as active. They measured a low-grade temperature at the facility today. According to EMS laboratory tests were sent off but the results are not available. She was sent into the ED this evening for further evaluation.  The patient will follow simple commands but I cannot get her to speak to me here in the emergency room Past Medical History  Diagnosis Date  . Cancer ([place])   . Stroke ([place])   . Diabetes mellitus   . Dementia   . Hypertension   . Hyperlipidemia   . Depression   . GERD (gastroesophageal reflux disease)    No past surgical history on file. No family history on file. Social History  Substance Use Topics  . Smoking status: Former Smoker    Types: Cigarettes    Quit date: 01/04/1979  . Smokeless tobacco: Not on file  . Alcohol Use: No   OB History    No data available     Review of Systems  All other systems reviewed and are negative.     Allergies  Review of patient's allergies indicates no known allergies.  Home Medications   Prior to Admission medications   Medication Sig Start Date End Date Taking? Authorizing Provider  acetaminophen (TYLENOL) 325 MG tablet Take 650 mg by mouth every 4 (four) hours as needed for mild pain.    Yes Historical Provider, MD  AMBULATORY NON FORMULARY MEDICATION Med Pass: Give 4 oz by mouth QID   Yes Historical Provider, MD  aspirin EC 325 MG tablet Take  325 mg by mouth daily. For CVA   Yes Historical Provider, MD  bisacodyl (DULCOLAX) 10 MG suppository Place 10 mg rectally daily as needed for moderate constipation.   Yes Historical Provider, MD  ENSURE (ENSURE) Take 1 Can by mouth 2 (two) times daily.   Yes Historical Provider, MD  ferrous sulfate 325 (65 FE) MG tablet Take 325 mg by mouth daily with breakfast. For iron deficiency   Yes Historical Provider, MD  hydrALAZINE (APRESOLINE) 25 MG tablet Take 25 mg by mouth 4 (four) times daily. Give 1 tablet (25 mg) by mouth along with Hydralazine 50 mg for (75 mg) QID (Q6hr)   Yes Historical Provider, MD  hydrALAZINE (APRESOLINE) 50 MG tablet Take 50 mg by mouth 4 (four) times daily. Give 1 table (50 mg) by mouth along with hydralazine 25 mg for (75 mg) QID (Q6hr)   Yes Historical Provider, MD  magnesium hydroxide (MILK OF MAGNESIA) 400 MG/5ML suspension Take 30 mLs by mouth daily as needed for mild constipation.   Yes Historical Provider, MD  metoprolol succinate (TOPROL-XL) 25 MG 24 hr tablet Take 25 mg by mouth 2 (two) times daily. Hold for heart rate less then 55   Yes Historical Provider, MD  Multiple Vitamin (MULTIVITAMIN WITH MINERALS) TABS Take 1 tablet by mouth daily.   Yes Historical Provider,  MD  nitroGLYCERIN (NITROSTAT) 0.4 MG SL tablet Place 0.4 mg under the tongue every 5 (five) minutes as needed for chest pain. Do not exceed 3 doses   Yes Historical Provider, MD  omeprazole (PRILOSEC) 20 MG capsule Take 20 mg by mouth daily before supper. For reflux   Yes Historical Provider, MD  polyethylene glycol (MIRALAX / GLYCOLAX) packet Take 17 g by mouth daily. For constipation   Yes Historical Provider, MD  potassium chloride SA (K-DUR,KLOR-CON) 20 MEQ tablet Take 20 mEq by mouth daily.   Yes Historical Provider, MD  senna-docusate (SENOKOT-S) 8.6-50 MG per tablet Take 1 tablet by mouth daily. Hold for loose stools   Yes Historical Provider, MD   BP 159/76 mmHg  Pulse 85  Temp(Src) 98.1 F  (36.7 C) (Axillary)  Resp 20  SpO2 97% Physical Exam  Constitutional: No distress.  Elderly, frail  HENT:  Head: Normocephalic and atraumatic.  Right Ear: External ear normal.  Left Ear: External ear normal.  Mouth/Throat: No oropharyngeal exudate.  Mucous membranes are dry  Eyes: Conjunctivae are normal. Right eye exhibits no discharge. Left eye exhibits no discharge. No scleral icterus.  Neck: Neck supple. No tracheal deviation present.  Cardiovascular: Normal rate, regular rhythm and intact distal pulses.   Pulmonary/Chest: Effort normal and breath sounds normal. No stridor. No respiratory distress. She has no wheezes. She has no rales.  Abdominal: Soft. Bowel sounds are normal. She exhibits no distension. There is no tenderness. There is no rebound and no guarding.  Musculoskeletal: She exhibits no edema or tenderness.  Neurological: She is alert. No cranial nerve deficit (Left facial droop, unintelligible speech) or sensory deficit. She exhibits normal muscle tone. She displays no seizure activity. GCS eye subscore is 4. GCS verbal subscore is 2. GCS motor subscore is 6.  Patient opens her eyes when I speak to her, she will follow commands and attempt to lift her arms and legs, questionable decreased strength right upper extremity,   Skin: Skin is warm and dry. No rash noted. She is not diaphoretic.  Psychiatric: She has a normal mood and affect.  Nursing note and vitals reviewed.   ED Course  Procedures   Medications given in the ED Medications  sodium chloride 0.9 % bolus 500 mL (0 mLs Intravenous Stopped 09/19/15 2206)  sodium chloride 0.9 % bolus 500 mL (500 mLs Intravenous New Bag/Given 09/19/15 2334)  cefTRIAXone (ROCEPHIN) 1 g in dextrose 5 % 50 mL IVPB (1 g Intravenous New Bag/Given 09/19/15 2334)     Labs Review Labs Reviewed  PROTIME-INR - Abnormal; Notable for the following:    Prothrombin Time 16.6 (*)    All other components within normal limits  CBC -  Abnormal; Notable for the following:    Hemoglobin 10.9 (*)    HCT 33.9 (*)    RDW 15.8 (*)    All other components within normal limits  DIFFERENTIAL - Abnormal; Notable for the following:    Lymphs Abs 0.4 (*)    All other components within normal limits  COMPREHENSIVE METABOLIC PANEL - Abnormal; Notable for the following:    Glucose, Bld 171 (*)    BUN 37 (*)    Creatinine, Ser 1.36 (*)    Total Protein 6.1 (*)    Albumin 2.8 (*)    ALT 12 (*)    GFR calc non Af Amer 32 (*)    GFR calc Af Amer 37 (*)    All other components within normal limits  URINALYSIS, ROUTINE W REFLEX MICROSCOPIC (NOT AT [facility]) - Abnormal; Notable for the following:    APPearance CLOUDY (*)    Bilirubin Urine SMALL (*)    Ketones, ur 15 (*)    Leukocytes, UA SMALL (*)    All other components within normal limits  URINE MICROSCOPIC-ADD ON - Abnormal; Notable for the following:    Squamous Epithelial / LPF 0-5 (*)    Bacteria, UA RARE (*)    Casts HYALINE CASTS (*)    All other components within normal limits  URINE CULTURE  LIPASE, BLOOD  I-STAT TROPOININ, ED    Imaging Review Ct Head Wo Contrast  09/20/2015  CLINICAL DATA:  Initial evaluation for altered mental status, facial droop. EXAM: CT HEAD WITHOUT CONTRAST TECHNIQUE: Contiguous axial images were obtained from the base of the skull through the vertex without intravenous contrast. COMPARISON:  Prior CT from 12/02/2013. FINDINGS: Generalized cerebral atrophy similar to prior. Patchy and confluent hypodensity within the periventricular and deep white matter both cerebral hemispheres most consistent with chronic small vessel ischemic disease. Remote right frontal lobe infarct again noted. No acute intracranial hemorrhage. No acute large vessel territory infarct. No mass lesion, midline shift, or mass effect. No hydrocephalus. No extra-axial fluid collection. Scalp soft tissues demonstrate no acute abnormality. No acute abnormality about the globes and  orbits. Paranasal sinuses are clear.  No mastoid effusion. Calvarium intact. IMPRESSION: 1. No acute intracranial process. 2. Remote right frontal lobe infarct, stable. 3. Generalized cerebral atrophy with moderate chronic microvascular ischemic disease, stable. Electronically Signed   By: Jeannine Boga M.D.   On: 09/20/2015 00:20   I have personally reviewed and evaluated these images and lab results as part of my medical decision-making.   EKG Interpretation   Date/Time:  Wednesday September 19 2015 20:50:43 EDT Ventricular Rate:  84 PR Interval:    QRS Duration: 133 QT Interval:  393 QTC Calculation: 465 R Axis:   35 Text Interpretation:  probable sinus rhythm Artifact in lead(s) I II III  aVR aVL aVF V1 V2 V3 V4 V5 V6 Poor data quality in current ECG precludes  serial comparison Confirmed by Scheryl Sanborn  MD-J, Yetzali Weld [id number]) on 09/19/2015  8:58:19 PM      MDM   Final diagnoses:  UTI (lower urinary tract infection)  Renal insufficiency  Metabolic encephalopathy    Labs are suggestive of a UTI associated with dehydration.  BP stable.  Doubt sepsis.  No acute abnormality on CT scan.  Family stated that her facial appearance seems to be her usual.  Suspect lethargy is related to her UTI and fever.  Will consult with medical service for admission, IV abx    Dorie Rank, MD 09/20/15 [phone]

## 2015-09-20 ENCOUNTER — Encounter (HOSPITAL_COMMUNITY): Payer: Self-pay | Admitting: Family Medicine

## 2015-09-20 ENCOUNTER — Observation Stay (HOSPITAL_COMMUNITY): Payer: Medicare Other

## 2015-09-20 DIAGNOSIS — J189 Pneumonia, unspecified organism: Secondary | ICD-10-CM | POA: Diagnosis not present

## 2015-09-20 DIAGNOSIS — E785 Hyperlipidemia, unspecified: Secondary | ICD-10-CM | POA: Diagnosis present

## 2015-09-20 DIAGNOSIS — N390 Urinary tract infection, site not specified: Secondary | ICD-10-CM | POA: Diagnosis present

## 2015-09-20 DIAGNOSIS — E860 Dehydration: Secondary | ICD-10-CM | POA: Diagnosis present

## 2015-09-20 DIAGNOSIS — I10 Essential (primary) hypertension: Secondary | ICD-10-CM

## 2015-09-20 DIAGNOSIS — R402362 Coma scale, best motor response, obeys commands, at arrival to emergency department: Secondary | ICD-10-CM | POA: Diagnosis present

## 2015-09-20 DIAGNOSIS — G9341 Metabolic encephalopathy: Secondary | ICD-10-CM | POA: Diagnosis present

## 2015-09-20 DIAGNOSIS — R4182 Altered mental status, unspecified: Secondary | ICD-10-CM | POA: Diagnosis present

## 2015-09-20 DIAGNOSIS — B9620 Unspecified Escherichia coli [E. coli] as the cause of diseases classified elsewhere: Secondary | ICD-10-CM | POA: Diagnosis present

## 2015-09-20 DIAGNOSIS — Z87891 Personal history of nicotine dependence: Secondary | ICD-10-CM | POA: Diagnosis not present

## 2015-09-20 DIAGNOSIS — R509 Fever, unspecified: Secondary | ICD-10-CM | POA: Diagnosis present

## 2015-09-20 DIAGNOSIS — D508 Other iron deficiency anemias: Secondary | ICD-10-CM | POA: Diagnosis not present

## 2015-09-20 DIAGNOSIS — N3000 Acute cystitis without hematuria: Secondary | ICD-10-CM | POA: Diagnosis not present

## 2015-09-20 DIAGNOSIS — I69359 Hemiplegia and hemiparesis following cerebral infarction affecting unspecified side: Secondary | ICD-10-CM | POA: Diagnosis not present

## 2015-09-20 DIAGNOSIS — Z681 Body mass index (BMI) 19 or less, adult: Secondary | ICD-10-CM | POA: Diagnosis not present

## 2015-09-20 DIAGNOSIS — R402142 Coma scale, eyes open, spontaneous, at arrival to emergency department: Secondary | ICD-10-CM | POA: Diagnosis present

## 2015-09-20 DIAGNOSIS — F0390 Unspecified dementia without behavioral disturbance: Secondary | ICD-10-CM | POA: Diagnosis present

## 2015-09-20 DIAGNOSIS — R402222 Coma scale, best verbal response, incomprehensible words, at arrival to emergency department: Secondary | ICD-10-CM | POA: Diagnosis present

## 2015-09-20 DIAGNOSIS — Z7982 Long term (current) use of aspirin: Secondary | ICD-10-CM | POA: Diagnosis not present

## 2015-09-20 DIAGNOSIS — N183 Chronic kidney disease, stage 3 (moderate): Secondary | ICD-10-CM | POA: Diagnosis present

## 2015-09-20 DIAGNOSIS — K219 Gastro-esophageal reflux disease without esophagitis: Secondary | ICD-10-CM | POA: Diagnosis present

## 2015-09-20 DIAGNOSIS — E43 Unspecified severe protein-calorie malnutrition: Secondary | ICD-10-CM | POA: Diagnosis present

## 2015-09-20 DIAGNOSIS — D509 Iron deficiency anemia, unspecified: Secondary | ICD-10-CM | POA: Diagnosis present

## 2015-09-20 DIAGNOSIS — Z66 Do not resuscitate: Secondary | ICD-10-CM | POA: Diagnosis present

## 2015-09-20 DIAGNOSIS — R404 Transient alteration of awareness: Secondary | ICD-10-CM | POA: Diagnosis not present

## 2015-09-20 DIAGNOSIS — R400 Somnolence: Secondary | ICD-10-CM | POA: Diagnosis not present

## 2015-09-20 DIAGNOSIS — I129 Hypertensive chronic kidney disease with stage 1 through stage 4 chronic kidney disease, or unspecified chronic kidney disease: Secondary | ICD-10-CM | POA: Diagnosis present

## 2015-09-20 DIAGNOSIS — E1122 Type 2 diabetes mellitus with diabetic chronic kidney disease: Secondary | ICD-10-CM | POA: Diagnosis present

## 2015-09-20 LAB — CBC
HCT: 32.4 % — ABNORMAL LOW (ref 36.0–46.0)
HCT: 33 % — ABNORMAL LOW (ref 36.0–46.0)
HEMOGLOBIN: 10.5 g/dL — AB (ref 12.0–15.0)
Hemoglobin: 10.4 g/dL — ABNORMAL LOW (ref 12.0–15.0)
MCH: 27.1 pg (ref 26.0–34.0)
MCH: 26.7 pg (ref 26.0–34.0)
MCHC: 31.8 g/dL (ref 30.0–36.0)
MCHC: 32.1 g/dL (ref 30.0–36.0)
MCV: 84 fL (ref 78.0–100.0)
MCV: 84.4 fL (ref 78.0–100.0)
PLATELETS: 205 10*3/uL (ref 150–400)
Platelets: 212 10*3/uL (ref 150–400)
RBC: 3.84 MIL/uL — ABNORMAL LOW (ref 3.87–5.11)
RBC: 3.93 MIL/uL (ref 3.87–5.11)
RDW: 15.7 % — AB (ref 11.5–15.5)
RDW: 15.8 % — ABNORMAL HIGH (ref 11.5–15.5)
WBC: 9.4 10*3/uL (ref 4.0–10.5)
WBC: 9.5 10*3/uL (ref 4.0–10.5)

## 2015-09-20 LAB — BASIC METABOLIC PANEL
Anion gap: 8 (ref 5–15)
BUN: 32 mg/dL — ABNORMAL HIGH (ref 6–20)
CALCIUM: 9.6 mg/dL (ref 8.9–10.3)
CO2: 25 mmol/L (ref 22–32)
CREATININE: 1.06 mg/dL — AB (ref 0.44–1.00)
Chloride: 109 mmol/L (ref 101–111)
GFR calc non Af Amer: 44 mL/min — ABNORMAL LOW (ref >60)
GFR, EST AFRICAN AMERICAN: 50 mL/min — AB (ref >60)
GLUCOSE: 232 mg/dL — AB (ref 65–99)
Potassium: 4 mmol/L (ref 3.5–5.1)
Sodium: 142 mmol/L (ref 135–145)

## 2015-09-20 LAB — CREATININE, SERUM
CREATININE: 1.08 mg/dL — AB (ref 0.44–1.00)
GFR calc Af Amer: 49 mL/min — ABNORMAL LOW (ref >60)
GFR, EST NON AFRICAN AMERICAN: 43 mL/min — AB (ref >60)

## 2015-09-20 LAB — MRSA PCR SCREENING: MRSA BY PCR: NEGATIVE

## 2015-09-20 LAB — PROCALCITONIN: Procalcitonin: 0.92 ng/mL

## 2015-09-20 MED ORDER — ASPIRIN EC 325 MG PO TBEC
325.0000 mg | DELAYED_RELEASE_TABLET | Freq: Every day | ORAL | Status: DC
  Administered 2015-09-20 – 2015-09-21 (×2): 325 mg via ORAL
  Filled 2015-09-20 (×3): qty 1

## 2015-09-20 MED ORDER — SENNOSIDES-DOCUSATE SODIUM 8.6-50 MG PO TABS
1.0000 | ORAL_TABLET | Freq: Every day | ORAL | Status: DC
  Administered 2015-09-20 – 2015-09-21 (×2): 1 via ORAL
  Filled 2015-09-20 (×3): qty 1

## 2015-09-20 MED ORDER — KCL IN DEXTROSE-NACL 20-5-0.9 MEQ/L-%-% IV SOLN
INTRAVENOUS | Status: AC
  Administered 2015-09-20: 03:00:00 via INTRAVENOUS
  Filled 2015-09-20 (×2): qty 1000

## 2015-09-20 MED ORDER — DEXTROSE 5 % IV SOLN
1.0000 g | INTRAVENOUS | Status: DC
  Administered 2015-09-20 – 2015-09-21 (×2): 1 g via INTRAVENOUS
  Filled 2015-09-20 (×3): qty 10

## 2015-09-20 MED ORDER — POTASSIUM CHLORIDE CRYS ER 20 MEQ PO TBCR
20.0000 meq | EXTENDED_RELEASE_TABLET | Freq: Every day | ORAL | Status: DC
  Administered 2015-09-20 – 2015-09-21 (×2): 20 meq via ORAL
  Filled 2015-09-20 (×3): qty 1

## 2015-09-20 MED ORDER — FERROUS SULFATE 325 (65 FE) MG PO TABS
325.0000 mg | ORAL_TABLET | Freq: Every day | ORAL | Status: DC
  Administered 2015-09-20 – 2015-09-21 (×2): 325 mg via ORAL
  Filled 2015-09-20 (×3): qty 1

## 2015-09-20 MED ORDER — ENSURE ENLIVE PO LIQD
1.0000 | Freq: Two times a day (BID) | ORAL | Status: DC
  Administered 2015-09-20 – 2015-09-21 (×3): 237 mL via ORAL

## 2015-09-20 MED ORDER — HYDRALAZINE HCL 25 MG PO TABS: 25.0000 mg | ORAL_TABLET | Freq: Four times a day (QID) | ORAL | Status: DC

## 2015-09-20 MED ORDER — ENOXAPARIN SODIUM 30 MG/0.3ML ~~LOC~~ SOLN
20.0000 mg | SUBCUTANEOUS | Status: DC
  Administered 2015-09-20 – 2015-09-22 (×3): 20 mg via SUBCUTANEOUS
  Filled 2015-09-20: qty 0.3
  Filled 2015-09-20 (×3): qty 0.2
  Filled 2015-09-20 (×2): qty 0.3

## 2015-09-20 MED ORDER — HYDRALAZINE HCL 50 MG PO TABS
75.0000 mg | ORAL_TABLET | Freq: Four times a day (QID) | ORAL | Status: DC
  Administered 2015-09-20 – 2015-09-22 (×10): 75 mg via ORAL
  Filled 2015-09-20 (×12): qty 1

## 2015-09-20 MED ORDER — METOPROLOL SUCCINATE ER 25 MG PO TB24
25.0000 mg | ORAL_TABLET | Freq: Two times a day (BID) | ORAL | Status: DC
  Administered 2015-09-20 – 2015-09-21 (×4): 25 mg via ORAL
  Filled 2015-09-20 (×5): qty 1

## 2015-09-20 MED ORDER — BISACODYL 10 MG RE SUPP: 10.0000 mg | Freq: Every day | RECTAL | Status: DC | PRN

## 2015-09-20 MED ORDER — HYDRALAZINE HCL 50 MG PO TABS: 50.0000 mg | ORAL_TABLET | Freq: Four times a day (QID) | ORAL | Status: DC

## 2015-09-20 MED ORDER — AZITHROMYCIN 500 MG IV SOLR
500.0000 mg | INTRAVENOUS | Status: DC
  Administered 2015-09-20 – 2015-09-21 (×2): 500 mg via INTRAVENOUS
  Filled 2015-09-20 (×3): qty 500

## 2015-09-20 MED ORDER — POLYETHYLENE GLYCOL 3350 17 G PO PACK
17.0000 g | PACK | Freq: Every day | ORAL | Status: DC
  Administered 2015-09-20 – 2015-09-21 (×2): 17 g via ORAL
  Filled 2015-09-20 (×3): qty 1

## 2015-09-20 NOTE — ED Notes (Signed)
The pts speech is difficult to understand  When she breathes she blows both her cheeks outward

## 2015-09-20 NOTE — Progress Notes (Signed)
Inpatient Diabetes Program Recommendations  AACE/ADA: New Consensus Statement on Inpatient Glycemic Control (2015)  Target Ranges:  Prepandial:   less than 140 mg/dL      Peak postprandial:   less than 180 mg/dL (1-2 hours)      Critically ill patients:  140 - 180 mg/dL   Lab Results  Component Value Date   GLUCAP 105* 12/05/2013   HGBA1C 5.7 06/27/2015    Review of Glycemic Control- elevated lab glucose  Results for JANAISA, BIESER (MRN [id number]) as of 09/20/2015 11:52  Ref. Range 08/31/2015 00:00 09/19/2015 21:49 09/19/2015 22:02 09/19/2015 22:03 09/19/2015 22:40 09/20/2015 03:10 09/20/2015 03:49 09/20/2015 03:49  Glucose Latest Ref Range: 65-99 mg/dL 155 171 (H)     232 (H)     Diabetes history: noted Outpatient Diabetes medications: none Current orders for Inpatient glycemic control: none  Inpatient Diabetes Program Recommendations:  History of diabetes and elevated lab glucose this admission.  Do you want to check finger stick blood sugars?  Gentry Fitz, RN, BA, MHA, CDE Diabetes Coordinator Inpatient Diabetes Program  [phone] (Team Pager) [phone] ([place]) 09/20/2015 11:54 AM

## 2015-09-20 NOTE — ED Notes (Signed)
Report  Called to rn on 6n

## 2015-09-20 NOTE — ED Notes (Signed)
Pt sleeping. 

## 2015-09-20 NOTE — Progress Notes (Signed)
MD txt paged with msg that per family, pt needs a pureed diet. current diet consistency too hard for her to chew. Please adjust diet

## 2015-09-20 NOTE — Care Management Obs Status (Signed)
[facility] NOTIFICATION   Patient Details  Name: Amy Martin MRN: [id number] Date of Birth: [birth year]   Medicare Observation Status Notification Given:  Yes    Marilu Favre, RN 09/20/2015, 10:18 AM

## 2015-09-20 NOTE — Progress Notes (Signed)
Pharmacy Antibiotic Note  Amy Martin is a 82 y.o. female admitted on 09/19/2015 with UTI.  Pharmacy has been consulted for Ceftriaxone dosing for UTI.   Plan: -Ceftriaxone 1g IV q24h -F/U urine culture for directed therapy  Temp (24hrs), Avg:99.2 F (37.3 C), Min:98.1 F (36.7 C), Max:100.7 F (38.2 C)   Recent Labs Lab 09/19/15 2149  WBC 8.3  CREATININE 1.36*    Estimated Creatinine Clearance: 17.4 mL/min (by C-G formula based on Cr of 1.36).    No Known Allergies   [name] 09/20/2015 2:12 AM

## 2015-09-20 NOTE — ED Notes (Signed)
Pt returned from xray sleeping at present.

## 2015-09-20 NOTE — ED Notes (Signed)
Port chest x-ray done

## 2015-09-20 NOTE — H&P (Signed)
History and Physical  Patient Name: Amy Martin     [id number]    DOB: 28-Oct-1919    DOA: 09/19/2015 PCP: Hennie Duos, MD   Patient coming from: Nursing Home  Chief Complaint: Altered mental status  HPI: Amy Martin is a 82 y.o. female with a past medical history significant for HTN, CVA with hemiplegia and chronic IDA who presents with altered mental status.  The patient is altered and unable to provide meaningful history herself.  Per NH and daughter in law by phone, she has moderate to severe dementia at baseline (would recognize family and engage in conversation, but not know where she was) and able to make her needs known.  Today, she was noted to be febrile but otherwise asymptomatic.  Nursing home ordered a chest x-ray and CBC/BMP which were unremarkable, but later the patient was noted to be listless, sluggish, weak and with decreased responsiveness, and so was sent to the ER.  ED course: -Temp 100.59F, hemodynamically stable, saturating well on room air -Na 142, K 4.8, Cr 1.36 (baseline 1.1), WBC 8.3K, Hgb 10.9 -Lipase and troponin and INR normal -UA showed few WBC and RBC and bacteria, CXR showed equivocal L base patchy opacities -She was given ceftriaxone for UTI and TRH were asked to evaluate for admission     Review of Systems:  Unable to obtain due to patient mentation    Past Medical History  Diagnosis Date  . Cancer ([place])   . Stroke ([place])   . Diabetes mellitus   . Dementia   . Hypertension   . Hyperlipidemia   . Depression   . GERD (gastroesophageal reflux disease)     No past surgical history on file.  Social History: Patient lives at [facility].  The patient is not ambulatory.  Remote former smoker. Son Reita Cliche is POA.  From [facility] originally.  Dementia at baseline, able to make needs known and able to participate in ADLs, but not oriented to place at baseline.    No Known Allergies  Family history: Patient unable to provide due to  mentation  Prior to Admission medications   Medication Sig Start Date End Date Taking? Authorizing Provider  acetaminophen (TYLENOL) 325 MG tablet Take 650 mg by mouth every 4 (four) hours as needed for mild pain.    Yes Historical Provider, MD  AMBULATORY NON FORMULARY MEDICATION Med Pass: Give 4 oz by mouth QID   Yes Historical Provider, MD  aspirin EC 325 MG tablet Take 325 mg by mouth daily. For CVA   Yes Historical Provider, MD  bisacodyl (DULCOLAX) 10 MG suppository Place 10 mg rectally daily as needed for moderate constipation.   Yes Historical Provider, MD  ENSURE (ENSURE) Take 1 Can by mouth 2 (two) times daily.   Yes Historical Provider, MD  ferrous sulfate 325 (65 FE) MG tablet Take 325 mg by mouth daily with breakfast. For iron deficiency   Yes Historical Provider, MD  hydrALAZINE (APRESOLINE) 25 MG tablet Take 25 mg by mouth 4 (four) times daily. Give 1 tablet (25 mg) by mouth along with Hydralazine 50 mg for (75 mg) QID (Q6hr)   Yes Historical Provider, MD  hydrALAZINE (APRESOLINE) 50 MG tablet Take 50 mg by mouth 4 (four) times daily. Give 1 table (50 mg) by mouth along with hydralazine 25 mg for (75 mg) QID (Q6hr)   Yes Historical Provider, MD  magnesium hydroxide (MILK OF MAGNESIA) 400 MG/5ML suspension Take 30 mLs by mouth daily as needed  for mild constipation.   Yes Historical Provider, MD  metoprolol succinate (TOPROL-XL) 25 MG 24 hr tablet Take 25 mg by mouth 2 (two) times daily. Hold for heart rate less then 55   Yes Historical Provider, MD  Multiple Vitamin (MULTIVITAMIN WITH MINERALS) TABS Take 1 tablet by mouth daily.   Yes Historical Provider, MD  nitroGLYCERIN (NITROSTAT) 0.4 MG SL tablet Place 0.4 mg under the tongue every 5 (five) minutes as needed for chest pain. Do not exceed 3 doses   Yes Historical Provider, MD  omeprazole (PRILOSEC) 20 MG capsule Take 20 mg by mouth daily before supper. For reflux   Yes Historical Provider, MD  polyethylene glycol (MIRALAX /  GLYCOLAX) packet Take 17 g by mouth daily. For constipation   Yes Historical Provider, MD  potassium chloride SA (K-DUR,KLOR-CON) 20 MEQ tablet Take 20 mEq by mouth daily.   Yes Historical Provider, MD  senna-docusate (SENOKOT-S) 8.6-50 MG per tablet Take 1 tablet by mouth daily. Hold for loose stools   Yes Historical Provider, MD       Physical Exam: BP 182/83 mmHg  Pulse 94  Temp(Src) 98.1 F (36.7 C) (Axillary)  Resp 18  SpO2 97% General appearance: Thin elderly adult female, listless and tired but responsive and in no acute distress.   Eyes: Anicteric, conjunctiva pink, lids and lashes normal.     ENT: No nasal deformity, discharge, or epistaxis.  OP dry without lesions.   Lymph: No cervical or supraclavicular lymphadenopathy. Skin: Warm and dry.  Tenting.  No suspicious rashes or lesions. Cardiac: RRR, nl S1-S2, no murmurs appreciated.  Respiratory: Normal respiratory rate and rhythm.  CTAB without rales or wheezes on my exam. Abdomen: Abdomen soft without rigidity.  No TTP. No ascites, distension.   MSK: No deformities or effusions. Neuro: EOMI and PERRL.  Responsive to questions, but speech slurred.  Muscle tone increased symmetrically, some contractures of arms.  Decreased muscle mass diffusely.  Sensorium decreased, and responding to questions, but weakly following commands and responses are not reliable.   Psych: Unable to assess.    Labs on Admission:  I have personally reviewed following labs and imaging studies: CBC:  Recent Labs Lab 09/19/15 2149  WBC 8.3  NEUTROABS 7.4  HGB 10.9*  HCT 33.9*  MCV 84.3  PLT 99991111   Basic Metabolic Panel:  Recent Labs Lab 09/19/15 2149  NA 142  K 4.8  CL 108  CO2 26  GLUCOSE 171*  BUN 37*  CREATININE 1.36*  CALCIUM 9.9   GFR: Estimated Creatinine Clearance: 17.4 mL/min (by C-G formula based on Cr of 1.36). Liver Function Tests:  Recent Labs Lab 09/19/15 2149  AST 23  ALT 12*  ALKPHOS 70  BILITOT 0.6  PROT  6.1*  ALBUMIN 2.8*    Recent Labs Lab 09/19/15 2203  LIPASE 17   No results for input(s): AMMONIA in the last 168 hours. Coagulation Profile:  Recent Labs Lab 09/19/15 2149  INR 1.33   Cardiac Enzymes: No results for input(s): CKTOTAL, CKMB, CKMBINDEX, TROPONINI in the last 168 hours. BNP (last 3 results) No results for input(s): PROBNP in the last 8760 hours. HbA1C: No results for input(s): HGBA1C in the last 72 hours. CBG: No results for input(s): GLUCAP in the last 168 hours. Lipid Profile: No results for input(s): CHOL, HDL, LDLCALC, TRIG, CHOLHDL, LDLDIRECT in the last 72 hours. Thyroid Function Tests: No results for input(s): TSH, T4TOTAL, FREET4, T3FREE, THYROIDAB in the last 72 hours. Anemia Panel: No  results for input(s): VITAMINB12, FOLATE, FERRITIN, TIBC, IRON, RETICCTPCT in the last 72 hours. Sepsis Labs:        Radiological Exams on Admission: Personally reviewed: Ct Head Wo Contrast  09/20/2015  CLINICAL DATA:  Initial evaluation for altered mental status, facial droop. EXAM: CT HEAD WITHOUT CONTRAST TECHNIQUE: Contiguous axial images were obtained from the base of the skull through the vertex without intravenous contrast. COMPARISON:  Prior CT from 12/02/2013. FINDINGS: Generalized cerebral atrophy similar to prior. Patchy and confluent hypodensity within the periventricular and deep white matter both cerebral hemispheres most consistent with chronic small vessel ischemic disease. Remote right frontal lobe infarct again noted. No acute intracranial hemorrhage. No acute large vessel territory infarct. No mass lesion, midline shift, or mass effect. No hydrocephalus. No extra-axial fluid collection. Scalp soft tissues demonstrate no acute abnormality. No acute abnormality about the globes and orbits. Paranasal sinuses are clear.  No mastoid effusion. Calvarium intact. IMPRESSION: 1. No acute intracranial process. 2. Remote right frontal lobe infarct, stable. 3.  Generalized cerebral atrophy with moderate chronic microvascular ischemic disease, stable. Electronically Signed   By: Jeannine Boga M.D.   On: 09/20/2015 00:20    EKG: Independently reviewed. Rate 84, QTc 465, poor baseline, regular narrow complex, no obvious ST changes.    Assessment/Plan 1. Altered mental status and fever:  Multifactorial AMS in setting of dehydration, fever with CXR opacity on top of dementia and dehydration. -Fluids overnight -Ceftriaxone and azithromycin  -Check procalcitonin   2. Debility:  In wheelchair and longterm care at baseline, will defer PT eval.  3. Severe PCM:  -Continue Ensure -Broached DNR code status with daughter-in-law, to which she was resistant   4. CKD stage III:  Stable  5. HTN:  -Continue hydralazine and metoprolol  6. Hx of CVA:  -Continue aspirin 325  7. GERD:  -Hold PPI  8. Chronic iron def anemia: -Continue iron    DVT prophylaxis: Lovenox  Code Status: FULL  Family Communication: Daughter in Sports coach by phone, assessment discussed, plan described.  All questions answered.  CODE STATUS confirmed.  Disposition Plan: Anticipate fluids and antibiotics overnight.  Likely home in 24-48 hours Consults called: None Admission status: Obs, med surg At the point of initial evaluation, it is my clinical opinion that admission for OBSERVATION is reasonable and necessary because the patient's presenting complaints in the context of their chronic conditions represent sufficient risk of deterioration or significant morbidity to constitute reasonable grounds for close observation in the hospital setting, but that the patient may be medically stable for discharge from the hospital within 24 to 48 hours.    Medical decision making: Patient seen at 1:29 AM on 09/20/2015.  The patient was discussed with Dr. Tomi Bamberger. What exists of the patient's chart was reviewed in depth as well as outside records from her nursing home.  Clinical condition:  stable.        Edwin Dada [facility] Pager [phone]

## 2015-09-20 NOTE — ED Notes (Signed)
Admitting doctor at the bedside 

## 2015-09-20 NOTE — Progress Notes (Signed)
PROGRESS NOTE                                                                                                                                                                                                             Patient Demographics:    Amy Martin, is a 82 y.o. female, DOB - 08-11-1919, [id number]  Admit date - 09/19/2015   Admitting Physician Edwin Dada, MD  Outpatient Primary MD for the patient is Hennie Duos, MD  LOS -   Chief Complaint  Patient presents with  . Fatigue       Brief Narrative   82 y.o. female with history of HTN, CVA with hemiplegia and chronic IDA who presents with altered mental status, Significant for UTI and pneumonia.   Subjective:    Amy Martin cannot provide any complaints.   Assessment  & Plan :    Principal Problem:   Altered mental status Active Problems:   Essential hypertension   Senile dementia, uncomplicated   Anemia   CKD (chronic kidney disease) stage 3, GFR 30-59 ml/min   Protein-calorie malnutrition, severe (HCC)   Fever   Dehydration   Altered mental status and fever:  - Multifactorial AMS in setting of dehydration, pneumonia and UTI , on top of baseline dementia .  Pneumonia - Continue Rocephin and azithromycin  UTI - Urine culture, continue with Rocephin  Debility - In wheelchair and longterm care at baseline, will defer PT eval.  Severe PCM:  - Continue Ensure, Continues to have poor appetite.  CKD stage III:  - Stable  HTN - Continue hydralazine and metoprolol  Hx of CVA  - Continue aspirin 325   GERD:  - Hold PPI  Chronic iron def anemia: - Continue iron      Code Status : full  Family Communication  : None at bedside  Disposition Plan  : Back to SNF when stable  Consults  :  none  Procedures  :none  DVT Prophylaxis  :  Lovenox   Lab Results  Component Value Date   PLT 212 09/20/2015   PLT 205 09/20/2015     Antibiotics  :    Anti-infectives    Start     Dose/Rate Route Frequency Ordered Stop   09/20/15 2200  cefTRIAXone (ROCEPHIN) 1 g in dextrose 5 % 50 mL  IVPB     1 g 100 mL/hr over 30 Minutes Intravenous Every 24 hours 09/20/15 0214     09/20/15 0230  azithromycin (ZITHROMAX) 500 mg in dextrose 5 % 250 mL IVPB     500 mg 250 mL/hr over 60 Minutes Intravenous Every 24 hours 09/20/15 0228 09/27/15 0229   09/19/15 2330  cefTRIAXone (ROCEPHIN) 1 g in dextrose 5 % 50 mL IVPB     1 g 100 mL/hr over 30 Minutes Intravenous  Once 09/19/15 2320 09/20/15 0034        Objective:   Filed Vitals:   09/20/15 0045 09/20/15 0100 09/20/15 0213 09/20/15 0427  BP: 187/85 182/83 157/82 155/71  Pulse: 89 94 92 94  Temp:   98.2 F (36.8 C) 98.2 F (36.8 C)  TempSrc:   Oral Oral  Resp:   19 19  SpO2: 95% 97% 98% 98%    Wt Readings from Last 3 Encounters:  09/19/15 43.455 kg (95 lb 12.8 oz)  08/23/15 45.36 kg (100 lb)  08/17/15 45.632 kg (100 lb 9.6 oz)     Intake/Output Summary (Last 24 hours) at 09/20/15 1448 Last data filed at 09/20/15 0935  Gross per 24 hour  Intake      0 ml  Output      0 ml  Net      0 ml     Physical Exam  Sleepy, but wakes up to loud verbal stimuli , frail,  Confused Symmetrical Chest wall movement, Good air movement bilaterally,  RRR,No Gallops,Rubs or new Murmurs, No Parasternal Heave +ve B.Sounds, Abd Soft, No tenderness, No organomegaly appriciated,  No Cyanosis, Clubbing or edema, No new Rash or bruise      Data Review:    CBC  Recent Labs Lab 09/19/15 2149 09/20/15 0349  WBC 8.3 9.5  9.4  HGB 10.9* 10.4*  10.5*  HCT 33.9* 32.4*  33.0*  PLT 205 205  212  MCV 84.3 84.4  84.0  MCH 27.1 27.1  26.7  MCHC 32.2 32.1  31.8  RDW 15.8* 15.8*  15.7*  LYMPHSABS 0.4*  --   MONOABS 0.6  --   EOSABS 0.0  --   BASOSABS 0.0  --     Chemistries   Recent Labs Lab 09/19/15 2149 09/20/15 0349  NA 142 142  K 4.8 4.0  CL 108 109  CO2  26 25  GLUCOSE 171* 232*  BUN 37* 32*  CREATININE 1.36* 1.08*  1.06*  CALCIUM 9.9 9.6  AST 23  --   ALT 12*  --   ALKPHOS 70  --   BILITOT 0.6  --    ------------------------------------------------------------------------------------------------------------------ No results for input(s): CHOL, HDL, LDLCALC, TRIG, CHOLHDL, LDLDIRECT in the last 72 hours.  Lab Results  Component Value Date   HGBA1C 5.7 06/27/2015   ------------------------------------------------------------------------------------------------------------------ No results for input(s): TSH, T4TOTAL, T3FREE, THYROIDAB in the last 72 hours.  Invalid input(s): FREET3 ------------------------------------------------------------------------------------------------------------------ No results for input(s): VITAMINB12, FOLATE, FERRITIN, TIBC, IRON, RETICCTPCT in the last 72 hours.  Coagulation profile  Recent Labs Lab 09/19/15 2149  INR 1.33    No results for input(s): DDIMER in the last 72 hours.  Cardiac Enzymes No results for input(s): CKMB, TROPONINI, MYOGLOBIN in the last 168 hours.  Invalid input(s): CK ------------------------------------------------------------------------------------------------------------------ No results found for: BNP  Inpatient Medications  Scheduled Meds: . aspirin EC  325 mg Oral Daily  . azithromycin  500 mg Intravenous Q24H  . cefTRIAXone (ROCEPHIN)  IV  1 g  Intravenous Q24H  . enoxaparin (LOVENOX) injection  20 mg Subcutaneous Q24H  . feeding supplement (ENSURE ENLIVE)  1 Bottle Oral BID  . ferrous sulfate  325 mg Oral Q breakfast  . hydrALAZINE  75 mg Oral Q6H  . metoprolol succinate  25 mg Oral BID  . polyethylene glycol  17 g Oral Daily  . potassium chloride SA  20 mEq Oral Daily  . senna-docusate  1 tablet Oral Daily   Continuous Infusions: . dextrose 5 % and 0.9 % NaCl with KCl 20 mEq/L 75 mL/hr at 09/20/15 0254   PRN Meds:.bisacodyl  Micro  Results Recent Results (from the past 240 hour(s))  Urine culture     Status: None (Preliminary result)   Collection Time: 09/19/15 10:40 PM  Result Value Ref Range Status   Specimen Description URINE, CATHETERIZED  Final   Special Requests NONE  Final   Culture PENDING  Incomplete   Report Status PENDING  Incomplete  MRSA PCR Screening     Status: None   Collection Time: 09/20/15  3:10 AM  Result Value Ref Range Status   MRSA by PCR NEGATIVE NEGATIVE Final    Comment:        The GeneXpert MRSA Assay (FDA approved for NASAL specimens only), is one component of a comprehensive MRSA colonization surveillance program. It is not intended to diagnose MRSA infection nor to guide or monitor treatment for MRSA infections.     Radiology Reports Ct Head Wo Contrast  09/20/2015  CLINICAL DATA:  Initial evaluation for altered mental status, facial droop. EXAM: CT HEAD WITHOUT CONTRAST TECHNIQUE: Contiguous axial images were obtained from the base of the skull through the vertex without intravenous contrast. COMPARISON:  Prior CT from 12/02/2013. FINDINGS: Generalized cerebral atrophy similar to prior. Patchy and confluent hypodensity within the periventricular and deep white matter both cerebral hemispheres most consistent with chronic small vessel ischemic disease. Remote right frontal lobe infarct again noted. No acute intracranial hemorrhage. No acute large vessel territory infarct. No mass lesion, midline shift, or mass effect. No hydrocephalus. No extra-axial fluid collection. Scalp soft tissues demonstrate no acute abnormality. No acute abnormality about the globes and orbits. Paranasal sinuses are clear.  No mastoid effusion. Calvarium intact. IMPRESSION: 1. No acute intracranial process. 2. Remote right frontal lobe infarct, stable. 3. Generalized cerebral atrophy with moderate chronic microvascular ischemic disease, stable. Electronically Signed   By: Jeannine Boga M.D.   On:  09/20/2015 00:20   Dg Chest Portable 1 View  09/20/2015  CLINICAL DATA:  Initial evaluation for acute fever, weakness. EXAM: PORTABLE CHEST 1 VIEW COMPARISON:  Prior radiograph from 12/02/2013. FINDINGS: Cardiac and mediastinal silhouettes are stable in size and contour, and remain within normal limits. Atheromatous plaque within the aortic arch. Lungs are hypoinflated. Perihilar vascular congestion, which may be related to shallow lung inflation. There are patchy left basilar opacities, which may reflect atelectasis or infiltrates. No pulmonary edema or pleural effusion. No pneumothorax. No acute osseous abnormality. Osteopenia. Degenerative osteoarthrosis about the shoulders. Surgical clips overlie the left axilla. IMPRESSION: 1. Shallow lung inflation with mild patchy left basilar opacity, which may reflect atelectasis or infiltrate. 2. No other active cardiopulmonary disease. 3. Aortic atherosclerosis peer Electronically Signed   By: Jeannine Boga M.D.   On: 09/20/2015 02:02      [facility], Noach Calvillo M.D on 09/20/2015 at 2:48 PM  Between 7am to 7pm - Pager - [phone]  After 7pm go to [email or web address] - password [email or web address]  [facility] -  Office  [phone]

## 2015-09-21 DIAGNOSIS — J189 Pneumonia, unspecified organism: Principal | ICD-10-CM

## 2015-09-21 DIAGNOSIS — D508 Other iron deficiency anemias: Secondary | ICD-10-CM

## 2015-09-21 DIAGNOSIS — N3000 Acute cystitis without hematuria: Secondary | ICD-10-CM

## 2015-09-21 DIAGNOSIS — R404 Transient alteration of awareness: Secondary | ICD-10-CM

## 2015-09-21 LAB — CBC
HEMATOCRIT: 32.5 % — AB (ref 36.0–46.0)
HEMOGLOBIN: 10.3 g/dL — AB (ref 12.0–15.0)
MCH: 26.2 pg (ref 26.0–34.0)
MCHC: 31.7 g/dL (ref 30.0–36.0)
MCV: 82.7 fL (ref 78.0–100.0)
Platelets: 236 10*3/uL (ref 150–400)
RBC: 3.93 MIL/uL (ref 3.87–5.11)
RDW: 15.7 % — ABNORMAL HIGH (ref 11.5–15.5)
WBC: 6.9 10*3/uL (ref 4.0–10.5)

## 2015-09-21 LAB — BASIC METABOLIC PANEL
Anion gap: 5 (ref 5–15)
BUN: 28 mg/dL — AB (ref 4–21)
BUN: 28 mg/dL — AB (ref 6–20)
CHLORIDE: 112 mmol/L — AB (ref 101–111)
CO2: 27 mmol/L (ref 22–32)
CREATININE: 1 mg/dL (ref 0.5–1.1)
CREATININE: 1.04 mg/dL — AB (ref 0.44–1.00)
Calcium: 9.6 mg/dL (ref 8.9–10.3)
GFR calc Af Amer: 52 mL/min — ABNORMAL LOW (ref >60)
GFR calc non Af Amer: 45 mL/min — ABNORMAL LOW (ref >60)
Glucose, Bld: 210 mg/dL — ABNORMAL HIGH (ref 65–99)
POTASSIUM: 3.9 mmol/L (ref 3.5–5.1)
SODIUM: 144 mmol/L (ref 135–145)

## 2015-09-21 LAB — GLUCOSE, CAPILLARY
GLUCOSE-CAPILLARY: 98 mg/dL (ref 65–99)
Glucose-Capillary: 96 mg/dL (ref 65–99)
Glucose-Capillary: 124 mg/dL — ABNORMAL HIGH (ref 65–99)

## 2015-09-21 LAB — CBC AND DIFFERENTIAL: WBC: 6.9 10*3/mL

## 2015-09-21 MED ORDER — AZITHROMYCIN 250 MG PO TABS
500.0000 mg | ORAL_TABLET | Freq: Every day | ORAL | Status: DC
  Administered 2015-09-21: 500 mg via ORAL
  Filled 2015-09-21: qty 2

## 2015-09-21 MED ORDER — ADULT MULTIVITAMIN W/MINERALS CH
1.0000 | ORAL_TABLET | Freq: Every day | ORAL | Status: DC
  Administered 2015-09-21: 1 via ORAL
  Filled 2015-09-21 (×2): qty 1

## 2015-09-21 MED ORDER — KCL IN DEXTROSE-NACL 20-5-0.9 MEQ/L-%-% IV SOLN
INTRAVENOUS | Status: AC
  Administered 2015-09-21 – 2015-09-22 (×2): via INTRAVENOUS
  Filled 2015-09-21 (×2): qty 1000

## 2015-09-21 NOTE — Care Management Important Message (Signed)
Important Message  Patient Details  Name: Amy Martin MRN: [id number] Date of Birth: 01-11-1920   Medicare Important Message Given:  Yes    Loann Quill 09/21/2015, 9:59 AM

## 2015-09-21 NOTE — Progress Notes (Signed)
Initial Nutrition Assessment  DOCUMENTATION CODES:   Underweight, Severe malnutrition in context of chronic illness  INTERVENTION:   -Continue Ensure Enlive po BID, each supplement provides 350 kcal and 20 grams of protein -Mighty Shake with meals -MVI daily  NUTRITION DIAGNOSIS:   Malnutrition related to chronic illness as evidenced by severe depletion of body fat, severe depletion of muscle mass.  GOAL:   Patient will meet greater than or equal to 90% of their needs  MONITOR:   PO intake, Supplement acceptance, Labs, Weight trends, Skin, I & O's  REASON FOR ASSESSMENT:   Other (Comment)    ASSESSMENT:   Amy Martin is a 82 y.o. female with a past medical history significant for HTN, CVA with hemiplegia and chronic IDA who presents with altered mental status.  RD pulled to chart due to low BMI.   Pt admitted with AMS. She is a resident of [facility].   Pt unable to provide hx and no family present at time of visit. Pt did not arouse to name being called or during exam.   Reviewed SNF records. Pt uses dentures when eating. She receives KCl, MVI, ferrous sulfate, 4 oz Medpass 4 times daily, and Ensure BID, per MAR. Noted RN downgraded diet to puree on 09/20/15 PM due to difficulty tolerating texture of heart healthy diet.   Meal completion has been poor; 0-30%. Ensure supplement at bedside has been untouched.  Reviewed wt hx; noted a 10.5% wt loss over the past 3 months. Suspect ongoing weight loss and poor oral intake PTA.  Nutrition-Focused physical exam completed. Findings are severe fat depletion, severe muscle depletion, and no edema.   Per MD notes, MD attempted to discuss hospice/comfort care, but pt family refused.   Labs reviewed.  Diet Order:  DIET - DYS 1 Room service appropriate?: Yes; Fluid consistency:: Thin  Skin:  Reviewed, no issues  Last BM:  09/20/15  Height:   Ht Readings from Last 1 Encounters:  09/20/15 5\' 4"  (1.626 m)     Weight:   Wt Readings from Last 1 Encounters:  09/20/15 94 lb 12.8 oz (43 kg)    Ideal Body Weight:  54.5 kg  BMI:  Body mass index is 16.26 kg/(m^2).  Estimated Nutritional Needs:   Kcal:  1200-1400  Protein:  50-60 grams  Fluid:  1.2-1.4 L  EDUCATION NEEDS:   No education needs identified at this time  Amy Martin A. Jimmye Norman, RD, LDN, CDE Pager: [phone] After hours Pager: [phone]

## 2015-09-21 NOTE — NC FL2 (Signed)
[place] MEDICAID FL2 LEVEL OF CARE SCREENING TOOL     IDENTIFICATION  Patient Name: Amy Martin Birthdate: 10/09/1919 Sex: female Admission Date (Current Location): 09/19/2015  [place] and [place] Number:  Kathleen Argue [id number] [place] and Address:  [facility], [place] [address], [place], [place] [address]      Provider Number: [id number]  Attending Physician Name and Address:  Albertine Patricia, MD  Relative Name and Phone Number:  [place], [place] [phone]    Current Level of Care: Hospital Recommended Level of Care: [place] Prior Approval Number:    Date Approved/Denied:   PASRR Number:   [id number]   Discharge Plan: SNF    Current Diagnoses: Patient Active Problem List   Diagnosis Date Noted  . Fever 09/20/2015  . Dehydration 09/20/2015  . Altered mental status 09/20/2015  . Dysphagia following nontraumatic intracerebral hemorrhage 07/13/2015  . Lip edema 06/18/2015  . History of CVA (cerebrovascular accident) 03/12/2015  . Cough 01/01/2015  . History of stroke 05/08/2014  . Constipation 02/27/2014  . Thrush 12/11/2013  . Protein-calorie malnutrition, severe ([place]) 12/04/2013  . Metabolic encephalopathy [id number]  . Seizure ([place]) 12/02/2013  . Esophageal reflux 07/22/2013  . CKD (chronic kidney disease) stage 3, GFR 30-59 ml/min 01/14/2013  . Anemia 11/02/2012  . Hemorrhoid 11/02/2012  . 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 Senile dementia, uncomplicated [id number]  . Iron deficiency anemia 08/13/2012  . Essential hypertension 01/06/2012  . CVA (cerebral infarction) 01/04/2012    Orientation RESPIRATION BLADDER Height & Weight     Self, Situation  Normal Incontinent Weight: 94 lb 12.8 oz (43 kg) Height:  5\' 4"  (162.6 cm)  BEHAVIORAL SYMPTOMS/MOOD NEUROLOGICAL BOWEL NUTRITION STATUS    Convulsions/Seizures Incontinent    AMBULATORY STATUS COMMUNICATION OF NEEDS Skin    Limited Assist Verbally Surgical wounds                       Personal Care Assistance Level of Assistance  Bathing, Dressing Bathing Assistance: Limited assistance   Dressing Assistance: Limited assistance     Functional Limitations Info  Sight, Hearing, Speech Sight Info: Adequate Hearing Info: Adequate Speech Info: Adequate    SPECIAL CARE FACTORS FREQUENCY                       Contractures Contractures Info: Not present    Additional Factors Info  Code Status, Allergies Code Status Info: DNR Allergies Info: No Known Allergies           Current Medications (09/21/2015):  This is the current hospital active medication list Current Facility-Administered Medications  Medication Dose Route Frequency Provider Last Rate Last Dose  . aspirin EC tablet 325 mg  325 mg Oral Daily Edwin Dada, MD   325 mg at 09/21/15 0957  . azithromycin (ZITHROMAX) tablet 500 mg  500 mg Oral Daily Jake Church Masters, [facility]      . bisacodyl (DULCOLAX) suppository 10 mg  10 mg Rectal Daily PRN Edwin Dada, MD      . cefTRIAXone (ROCEPHIN) 1 g in dextrose 5 % 50 mL IVPB  1 g Intravenous Q24H Erenest Blank, RPH   1 g at 09/20/15 2123  . dextrose 5 % and 0.9 % NaCl with KCl 20 mEq/L infusion   Intravenous Continuous Albertine Patricia, MD 75 mL/hr at 09/21/15 1458    . enoxaparin (LOVENOX) injection 20 mg  20 mg Subcutaneous  Q24H Edwin Dada, MD   20 mg at 09/21/15 0957  . feeding supplement (ENSURE ENLIVE) (ENSURE ENLIVE) liquid 237 mL  1 Bottle Oral BID Edwin Dada, MD   237 mL at 09/21/15 1000  . ferrous sulfate tablet 325 mg  325 mg Oral Q breakfast Edwin Dada, MD   325 mg at 09/21/15 0959  . hydrALAZINE (APRESOLINE) tablet 75 mg  75 mg Oral Q6H Edwin Dada, MD   75 mg at 09/21/15 1823  . metoprolol succinate (TOPROL-XL) 24 hr tablet 25 mg  25 mg Oral BID Edwin Dada, MD   25 mg at 09/21/15 0959  . multivitamin  with minerals tablet 1 tablet  1 tablet Oral Daily Albertine Patricia, MD   1 tablet at 09/21/15 1823  . polyethylene glycol (MIRALAX / GLYCOLAX) packet 17 g  17 g Oral Daily Edwin Dada, MD   17 g at 09/21/15 0957  . potassium chloride SA (K-DUR,KLOR-CON) CR tablet 20 mEq  20 mEq Oral Daily Edwin Dada, MD   20 mEq at 09/21/15 0957  . senna-docusate (Senokot-S) tablet 1 tablet  1 tablet Oral Daily Edwin Dada, MD   1 tablet at 09/21/15 [id number]     Discharge Medications: Please see discharge summary for a list of discharge medications.  Relevant Imaging Results:  Relevant Lab Results:   Additional Information SSN [id number]  Ross Ludwig, [place]

## 2015-09-21 NOTE — Progress Notes (Signed)
PROGRESS NOTE                                                                                                                                                                                                             Patient Demographics:    Amy Martin, is a 82 y.o. female, DOB - [birth year], [id number]  Admit date - 09/19/2015   Admitting Physician Edwin Dada, MD  Outpatient Primary MD for the patient is Hennie Duos, MD  LOS - 1  Chief Complaint  Patient presents with  . Fatigue       Brief Narrative   82 y.o. female with history of HTN, CVA with hemiplegia and chronic IDA who presents with altered mental status, Significant for UTI and pneumonia.   Subjective:    Amy Martin more awake today, denies any chest pain, nausea or shortness of breath.   Assessment  & Plan :    Principal Problem:   Altered mental status Active Problems:   Essential hypertension   Senile dementia, uncomplicated   Anemia   CKD (chronic kidney disease) stage 3, GFR 30-59 ml/min   Protein-calorie malnutrition, severe (HCC)   Fever   Dehydration   Altered mental status and fever:  - Multifactorial AMS in setting of dehydration, pneumonia and UTI , on top of baseline dementia . - Mentation significantly improved today  Pneumonia - Continue Rocephin and azithromycin  UTI - Urine culture growing Escherichia coli, sensitivities pending, continue with Rocephin  Debility - In wheelchair and longterm care at baseline..  Severe PCM:  - Continue Ensure, Continues to have poor appetite.  CKD stage III:  - Stable  HTN - Continue hydralazine and metoprolol  Hx of CVA  - Continue aspirin 325   GERD:  - Hold PPI  Chronic iron def anemia: - Continue iron      Code Status : DNR, confirmed with son via phone on 6/23  Family Communication  : Discussed with son via phone  Disposition Plan  : Back to SNF when  stable  Consults  :  none  Procedures  :none  DVT Prophylaxis  :  Lovenox   Lab Results  Component Value Date   PLT 236 09/21/2015    Antibiotics  :    Anti-infectives    Start     Dose/Rate Route  Frequency Ordered Stop   09/21/15 2200  azithromycin (ZITHROMAX) tablet 500 mg     500 mg Oral Daily 09/21/15 1109 09/27/15 2159   09/20/15 2200  cefTRIAXone (ROCEPHIN) 1 g in dextrose 5 % 50 mL IVPB     1 g 100 mL/hr over 30 Minutes Intravenous Every 24 hours 09/20/15 0214     09/20/15 0230  azithromycin (ZITHROMAX) 500 mg in dextrose 5 % 250 mL IVPB  Status:  Discontinued     500 mg 250 mL/hr over 60 Minutes Intravenous Every 24 hours 09/20/15 0228 09/21/15 1109   09/19/15 2330  cefTRIAXone (ROCEPHIN) 1 g in dextrose 5 % 50 mL IVPB     1 g 100 mL/hr over 30 Minutes Intravenous  Once 09/19/15 2320 09/20/15 0034        Objective:   Filed Vitals:   09/20/15 2028 09/20/15 2134 09/21/15 0425 09/21/15 1329  BP: 164/88 103/58 123/66 145/61  Pulse: 97 87 85 88  Temp: 98.3 F (36.8 C)  98.7 F (37.1 C) 98.7 F (37.1 C)  TempSrc: Oral  Oral Axillary  Resp: 17  19   Height:  5\' 4"  (1.626 m)    Weight:  43 kg (94 lb 12.8 oz)    SpO2: 95%  98% 98%    Wt Readings from Last 3 Encounters:  09/20/15 43 kg (94 lb 12.8 oz)  09/19/15 43.455 kg (95 lb 12.8 oz)  08/23/15 45.36 kg (100 lb)     Intake/Output Summary (Last 24 hours) at 09/21/15 1424 Last data filed at 09/21/15 1305  Gross per 24 hour  Intake   1080 ml  Output      0 ml  Net   1080 ml     Physical Exam  Sleepy, but wakes up to loud verbal stimuli , frail,  Confused Symmetrical Chest wall movement, Good air movement bilaterally,  RRR,No Gallops,Rubs or new Murmurs, No Parasternal Heave +ve B.Sounds, Abd Soft, No tenderness, No organomegaly appriciated,  No Cyanosis, Clubbing or edema, No new Rash or bruise      Data Review:    CBC  Recent Labs Lab 09/19/15 2149 09/20/15 0349 09/21/15 0244  WBC 8.3  9.5  9.4 6.9  HGB 10.9* 10.4*  10.5* 10.3*  HCT 33.9* 32.4*  33.0* 32.5*  PLT 205 205  212 236  MCV 84.3 84.4  84.0 82.7  MCH 27.1 27.1  26.7 26.2  MCHC 32.2 32.1  31.8 31.7  RDW 15.8* 15.8*  15.7* 15.7*  LYMPHSABS 0.4*  --   --   MONOABS 0.6  --   --   EOSABS 0.0  --   --   BASOSABS 0.0  --   --     Chemistries   Recent Labs Lab 09/19/15 2149 09/20/15 0349 09/21/15 0244  NA 142 142 144  K 4.8 4.0 3.9  CL 108 109 112*  CO2 26 25 27   GLUCOSE 171* 232* 210*  BUN 37* 32* 28*  CREATININE 1.36* 1.08*  1.06* 1.04*  CALCIUM 9.9 9.6 9.6  AST 23  --   --   ALT 12*  --   --   ALKPHOS 70  --   --   BILITOT 0.6  --   --    ------------------------------------------------------------------------------------------------------------------ No results for input(s): CHOL, HDL, LDLCALC, TRIG, CHOLHDL, LDLDIRECT in the last 72 hours.  Lab Results  Component Value Date   HGBA1C 5.7 06/27/2015   ------------------------------------------------------------------------------------------------------------------ No results for input(s): TSH, T4TOTAL, T3FREE, THYROIDAB in  the last 72 hours.  Invalid input(s): FREET3 ------------------------------------------------------------------------------------------------------------------ No results for input(s): VITAMINB12, FOLATE, FERRITIN, TIBC, IRON, RETICCTPCT in the last 72 hours.  Coagulation profile  Recent Labs Lab 09/19/15 2149  INR 1.33    No results for input(s): DDIMER in the last 72 hours.  Cardiac Enzymes No results for input(s): CKMB, TROPONINI, MYOGLOBIN in the last 168 hours.  Invalid input(s): CK ------------------------------------------------------------------------------------------------------------------ No results found for: BNP  Inpatient Medications  Scheduled Meds: . aspirin EC  325 mg Oral Daily  . azithromycin  500 mg Oral Daily  . cefTRIAXone (ROCEPHIN)  IV  1 g Intravenous Q24H  . enoxaparin  (LOVENOX) injection  20 mg Subcutaneous Q24H  . feeding supplement (ENSURE ENLIVE)  1 Bottle Oral BID  . ferrous sulfate  325 mg Oral Q breakfast  . hydrALAZINE  75 mg Oral Q6H  . metoprolol succinate  25 mg Oral BID  . polyethylene glycol  17 g Oral Daily  . potassium chloride SA  20 mEq Oral Daily  . senna-docusate  1 tablet Oral Daily   Continuous Infusions:   PRN Meds:.bisacodyl  Micro Results Recent Results (from the past 240 hour(s))  Urine culture     Status: Abnormal (Preliminary result)   Collection Time: 09/19/15 10:40 PM  Result Value Ref Range Status   Specimen Description URINE, CATHETERIZED  Final   Special Requests NONE  Final   Culture 60,000 COLONIES/mL ESCHERICHIA COLI (A)  Final   Report Status PENDING  Incomplete  MRSA PCR Screening     Status: None   Collection Time: 09/20/15  3:10 AM  Result Value Ref Range Status   MRSA by PCR NEGATIVE NEGATIVE Final    Comment:        The GeneXpert MRSA Assay (FDA approved for NASAL specimens only), is one component of a comprehensive MRSA colonization surveillance program. It is not intended to diagnose MRSA infection nor to guide or monitor treatment for MRSA infections.     Radiology Reports Ct Head Wo Contrast  09/20/2015  CLINICAL DATA:  Initial evaluation for altered mental status, facial droop. EXAM: CT HEAD WITHOUT CONTRAST TECHNIQUE: Contiguous axial images were obtained from the base of the skull through the vertex without intravenous contrast. COMPARISON:  Prior CT from 12/02/2013. FINDINGS: Generalized cerebral atrophy similar to prior. Patchy and confluent hypodensity within the periventricular and deep white matter both cerebral hemispheres most consistent with chronic small vessel ischemic disease. Remote right frontal lobe infarct again noted. No acute intracranial hemorrhage. No acute large vessel territory infarct. No mass lesion, midline shift, or mass effect. No hydrocephalus. No extra-axial fluid  collection. Scalp soft tissues demonstrate no acute abnormality. No acute abnormality about the globes and orbits. Paranasal sinuses are clear.  No mastoid effusion. Calvarium intact. IMPRESSION: 1. No acute intracranial process. 2. Remote right frontal lobe infarct, stable. 3. Generalized cerebral atrophy with moderate chronic microvascular ischemic disease, stable. Electronically Signed   By: Jeannine Boga M.D.   On: 09/20/2015 00:20   Dg Chest Portable 1 View  09/20/2015  CLINICAL DATA:  Initial evaluation for acute fever, weakness. EXAM: PORTABLE CHEST 1 VIEW COMPARISON:  Prior radiograph from 12/02/2013. FINDINGS: Cardiac and mediastinal silhouettes are stable in size and contour, and remain within normal limits. Atheromatous plaque within the aortic arch. Lungs are hypoinflated. Perihilar vascular congestion, which may be related to shallow lung inflation. There are patchy left basilar opacities, which may reflect atelectasis or infiltrates. No pulmonary edema or pleural effusion. No pneumothorax.  No acute osseous abnormality. Osteopenia. Degenerative osteoarthrosis about the shoulders. Surgical clips overlie the left axilla. IMPRESSION: 1. Shallow lung inflation with mild patchy left basilar opacity, which may reflect atelectasis or infiltrate. 2. No other active cardiopulmonary disease. 3. Aortic atherosclerosis peer Electronically Signed   By: Jeannine Boga M.D.   On: 09/20/2015 02:02   Time spent 25 minutes   ELGERGAWY, DAWOOD M.D on 09/21/2015 at 2:24 PM  Between 7am to 7pm - Pager - [phone]  After 7pm go to [email or web address] - password [email or web address]  [facility] -  Office  [phone]

## 2015-09-21 NOTE — Clinical Social Work Note (Signed)
Clinical Social Work Assessment  Patient Details  Name: Amy Martin MRN: [id number] Date of Birth: Aug 13, 1919  Date of referral:  09/21/15               Reason for consult:  Facility Placement                Permission sought to share information with:  Facility Sport and exercise psychologist, Family Supports Permission granted to share information::  Yes, Verbal Permission Granted  Name::     Amy, Martin [phone] or [phone]  Agency::  SNF admissions  Relationship::     Contact Information:     Housing/Transportation Living arrangements for the past 2 months:  [place] of Information:  Medical Team Patient Interpreter Needed:  None Criminal Activity/Legal Involvement Pertinent to Current Situation/Hospitalization:  No - Comment as needed Significant Relationships:  Adult Children Lives with:  Facility Resident Do you feel safe going back to the place where you live?  Yes Need for family participation in patient care:  Yes (Comment)  Care giving concerns:  Patient's son does not have any concerns about patient returning to SNF.   Social Worker assessment / plan: Patient is a 82 year old female who is alert and oriented x2, patient has some dementia, CSW spoke to patient's son to complete assessment.  Patient has been living at [facility] for several years, and the patient's son does not have any issues with her returning back to SNF.  Patient's son expressed that he hopes she improves before she is able to return back to SNF.  Patient's son did not have any other questions or concerns.  Employment status:  Retired Forensic scientist:  Medicare PT Recommendations:  Not assessed at this time [place] / Referral to community resources:  [place]  Patient/Family's Response to care: Patient's family in agreement of her returning back to SNF. Patient/Family's Understanding of and Emotional Response to Diagnosis, Current  Treatment, and Prognosis:  Patient's family aware of current treatment plan and prognosis.  Emotional Assessment Appearance:  Appears stated age Attitude/Demeanor/Rapport:    Affect (typically observed):  Appropriate, Calm Orientation:  Oriented to Self, Oriented to Situation Alcohol / Substance use:  Not Applicable Psych involvement (Current and /or in the community):  No (Comment)  Discharge Needs  Concerns to be addressed:  No discharge needs identified Readmission within the last 30 days:  No Current discharge risk:  None Barriers to Discharge:  No Barriers Identified   Anell Barr 09/21/2015, 6:52 PM

## 2015-09-22 LAB — HEMOGLOBIN A1C
Hgb A1c MFr Bld: 5.2 % (ref 4.8–5.6)
MEAN PLASMA GLUCOSE: 103 mg/dL

## 2015-09-22 LAB — URINE CULTURE

## 2015-09-22 LAB — GLUCOSE, CAPILLARY: Glucose-Capillary: 95 mg/dL (ref 65–99)

## 2015-09-22 MED ORDER — CEFUROXIME AXETIL 250 MG PO TABS: 250.0000 mg | ORAL_TABLET | Freq: Two times a day (BID) | ORAL | Status: DC

## 2015-09-22 MED ORDER — CEFUROXIME AXETIL 500 MG PO TABS: 250.0000 mg | ORAL_TABLET | Freq: Two times a day (BID) | ORAL | Status: DC

## 2015-09-22 NOTE — Clinical Social Work Note (Signed)
CSW contacted [facility] [place] for correct pt social security number and PSARR number. [facility] also notified that pt being discharged today per doctor.   CSW contacted pt son [name] and notified that his mother was discharged today. CSW also called [facility] to transport pt and informed pt nurse to give orders to SNF.

## 2015-09-22 NOTE — Discharge Instructions (Signed)
Follow with Primary MD Hennie Duos, MD in SNF  Get CBC, CMP, 2 view Chest X ray checked  by Primary MD next visit.    Activity: As tolerated with Full fall precautions use walker/cane & assistance as needed   Disposition SNF   Diet: Dysphagia 1/pured diet with thin liquid, with feeding assistance and aspiration precautions.  For Heart failure patients - Check your Weight same time everyday, if you gain over 2 pounds, or you develop in leg swelling, experience more shortness of breath or chest pain, call your Primary MD immediately. Follow Cardiac Low Salt Diet and 1.5 lit/day fluid restriction.   On your next visit with your primary care physician please Get Medicines reviewed and adjusted.   Please request your Prim.MD to go over all Hospital Tests and Procedure/Radiological results at the follow up, please get all Hospital records sent to your Prim MD by signing hospital release before you go home.   If you experience worsening of your admission symptoms, develop shortness of breath, life threatening emergency, suicidal or homicidal thoughts you must seek medical attention immediately by calling 911 or calling your MD immediately  if symptoms less severe.  You Must read complete instructions/literature along with all the possible adverse reactions/side effects for all the Medicines you take and that have been prescribed to you. Take any new Medicines after you have completely understood and accpet all the possible adverse reactions/side effects.   Do not drive, operating heavy machinery, perform activities at heights, swimming or participation in water activities or provide baby sitting services if your were admitted for syncope or siezures until you have seen by Primary MD or a Neurologist and advised to do so again.  Do not drive when taking Pain medications.    Do not take more than prescribed Pain, Sleep and Anxiety Medications  Special Instructions: If you have smoked  or chewed Tobacco  in the last 2 yrs please stop smoking, stop any regular Alcohol  and or any Recreational drug use.  Wear Seat belts while driving.   Please note  You were cared for by a hospitalist during your hospital stay. If you have any questions about your discharge medications or the care you received while you were in the hospital after you are discharged, you can call the unit and asked to speak with the hospitalist on call if the hospitalist that took care of you is not available. Once you are discharged, your primary care physician will handle any further medical issues. Please note that NO REFILLS for any discharge medications will be authorized once you are discharged, as it is imperative that you return to your primary care physician (or establish a relationship with a primary care physician if you do not have one) for your aftercare needs so that they can reassess your need for medications and monitor your lab values.

## 2015-09-22 NOTE — Progress Notes (Signed)
Report called to [place] at [phone].  AVS printed for Amy Martin, pending their arrival. IV removed. Belongings packed.

## 2015-09-22 NOTE — Discharge Summary (Signed)
Amy Martin, is a 82 y.o. female  DOB 1919/05/16  MRN [id number].  Admission date:  09/19/2015  Admitting Physician  Edwin Dada, MD  Discharge Date:  09/22/2015   Primary MD  Hennie Duos, MD  Recommendations for primary care physician for things to follow:  - Please check CBC, BMP in 1 week - consider palliative care in  consult if patient continues to have poor oral intake.  CODE STATUS is DO NOT RESUSCITATE, was confirmed with the son, if patient continues to have poor oral intake, and deteriorate, then would recommend palliative care consult to discuss goals of care, and possible comfort care, was discussed with the son, and agreeable to plan.  D/W with son darryl  at day of discharge Admission Diagnosis  Metabolic encephalopathy 99991111 UTI (lower urinary tract infection) [N39.0] Renal insufficiency [N28.9]   Discharge Diagnosis  Metabolic encephalopathy 99991111 UTI (lower urinary tract infection) [N39.0] Renal insufficiency [N28.9]    Principal Problem:   Altered mental status Active Problems:   Essential hypertension   Senile dementia, uncomplicated   Anemia   CKD (chronic kidney disease) stage 3, GFR 30-59 ml/min   Protein-calorie malnutrition, severe (HCC)   Fever   Dehydration      Past Medical History  Diagnosis Date  . Cancer ([facility])   . Stroke ([facility])   . Diabetes mellitus   . Dementia   . Hypertension   . Hyperlipidemia   . Depression   . GERD (gastroesophageal reflux disease)     History reviewed. No pertinent past surgical history.     History of present illness and  Hospital Course:     Kindly see H&P for history of present illness and admission details, please review complete Labs, Consult reports and Test reports for all details in brief  HPI  from the history and physical done on the day of admission 09/20/2015 HPI: Amy Martin is a 82  y.o. female with a past medical history significant for HTN, CVA with hemiplegia and chronic IDA who presents with altered mental status.  The patient is altered and unable to provide meaningful history herself. Per NH and daughter in law by phone, she has moderate to severe dementia at baseline (would recognize family and engage in conversation, but not know where she was) and able to make her needs known. Today, she was noted to be febrile but otherwise asymptomatic. Nursing home ordered a chest x-ray and CBC/BMP which were unremarkable, but later the patient was noted to be listless, sluggish, weak and with decreased responsiveness, and so was sent to the ER.  ED course: -Temp 100.34F, hemodynamically stable, saturating well on room air -Na 142, K 4.8, Cr 1.36 (baseline 1.1), WBC 8.3K, Hgb 10.9 -Lipase and troponin and INR normal -UA showed few WBC and RBC and bacteria, CXR showed equivocal L base patchy opacities -She was given ceftriaxone for UTI and TRH were asked to evaluate for admission   Hospital Course  82 y.o. female with history of HTN, CVA with hemiplegia  and chronic IDA who presents with altered mental status, Significant for UTI and pneumonia.  Altered mental status and fever:  - Multifactorial AMS in setting of dehydration, pneumonia and UTI , on top of baseline advanced  dementia .   Pneumonia - Treated with Rocephin and azithromycin, to discharge on 2 days on oral Ceftin.  UTI - Urine culture growing Escherichia coli, sensitive, treated with Rocephin   Debility - In wheelchair and longterm care at baseline..  Severe PCM:  - Continue Ensure, encourage by mouth intake, remains to have poor oral intake, discussed with son, if patient continues to have poor oral intake, then goals of care should focus on palliative, would recommend palliative care consult at facility,  CKD stage III:  - Stable  HTN - Continue hydralazine and metoprolol  Hx of CVA  -  Continue aspirin 325  GERD:  - Hold PPI  Chronic iron def anemia: - Continue iron   Discharge Condition:  Stable, but over all prognosis is poor giving poor oral intake and malnutrition secondary to advanced dementia.   Follow UP  Follow-up Information    Follow up with Hennie Duos, MD.   Specialty:  Internal Medicine   Contact information:   [place] [address] [phone]         Discharge Instructions  and  Discharge Medications    Discharge Instructions    Discharge instructions    Complete by:  As directed   Follow with Primary MD Hennie Duos, MD in SNF  Get CBC, CMP, 2 view Chest X ray checked  by Primary MD next visit.    Activity: As tolerated with Full fall precautions use walker/cane & assistance as needed   Disposition SNF   Diet: Dysphagia 1/pured diet with thin liquid, with feeding assistance and aspiration precautions.  For Heart failure patients - Check your Weight same time everyday, if you gain over 2 pounds, or you develop in leg swelling, experience more shortness of breath or chest pain, call your Primary MD immediately. Follow Cardiac Low Salt Diet and 1.5 lit/day fluid restriction.   On your next visit with your primary care physician please Get Medicines reviewed and adjusted.   Please request your Prim.MD to go over all Hospital Tests and Procedure/Radiological results at the follow up, please get all Hospital records sent to your Prim MD by signing hospital release before you go home.   If you experience worsening of your admission symptoms, develop shortness of breath, life threatening emergency, suicidal or homicidal thoughts you must seek medical attention immediately by calling 911 or calling your MD immediately  if symptoms less severe.  You Must read complete instructions/literature along with all the possible adverse reactions/side effects for all the Medicines you take and that have been  prescribed to you. Take any new Medicines after you have completely understood and accpet all the possible adverse reactions/side effects.   Do not drive, operating heavy machinery, perform activities at heights, swimming or participation in water activities or provide baby sitting services if your were admitted for syncope or siezures until you have seen by Primary MD or a Neurologist and advised to do so again.  Do not drive when taking Pain medications.    Do not take more than prescribed Pain, Sleep and Anxiety Medications  Special Instructions: If you have smoked or chewed Tobacco  in the last 2 yrs please stop smoking, stop any regular Alcohol  and or any Recreational drug  use.  Wear Seat belts while driving.   Please note  You were cared for by a hospitalist during your hospital stay. If you have any questions about your discharge medications or the care you received while you were in the hospital after you are discharged, you can call the unit and asked to speak with the hospitalist on call if the hospitalist that took care of you is not available. Once you are discharged, your primary care physician will handle any further medical issues. Please note that NO REFILLS for any discharge medications will be authorized once you are discharged, as it is imperative that you return to your primary care physician (or establish a relationship with a primary care physician if you do not have one) for your aftercare needs so that they can reassess your need for medications and monitor your lab values.            Medication List    STOP taking these medications        AMBULATORY NON FORMULARY MEDICATION      TAKE these medications        acetaminophen 325 MG tablet  Commonly known as:  TYLENOL  Take 650 mg by mouth every 4 (four) hours as needed for mild pain.     aspirin EC 325 MG tablet  Take 325 mg by mouth daily. For CVA     bisacodyl 10 MG suppository  Commonly known as:   DULCOLAX  Place 10 mg rectally daily as needed for moderate constipation.     cefUROXime 250 MG tablet  Commonly known as:  CEFTIN  Take 1 tablet (250 mg total) by mouth 2 (two) times daily with a meal. Please give for 2 days then stop     ENSURE  Take 1 Can by mouth 2 (two) times daily.     ferrous sulfate 325 (65 FE) MG tablet  Take 325 mg by mouth daily with breakfast. For iron deficiency     hydrALAZINE 25 MG tablet  Commonly known as:  APRESOLINE  Take 25 mg by mouth 4 (four) times daily. Give 1 tablet (25 mg) by mouth along with Hydralazine 50 mg for (75 mg) QID (Q6hr)     hydrALAZINE 50 MG tablet  Commonly known as:  APRESOLINE  Take 50 mg by mouth 4 (four) times daily. Give 1 table (50 mg) by mouth along with hydralazine 25 mg for (75 mg) QID (Q6hr)     magnesium hydroxide 400 MG/5ML suspension  Commonly known as:  MILK OF MAGNESIA  Take 30 mLs by mouth daily as needed for mild constipation.     metoprolol succinate 25 MG 24 hr tablet  Commonly known as:  TOPROL-XL  Take 25 mg by mouth 2 (two) times daily. Hold for heart rate less then 55     multivitamin with minerals Tabs tablet  Take 1 tablet by mouth daily.     nitroGLYCERIN 0.4 MG SL tablet  Commonly known as:  NITROSTAT  Place 0.4 mg under the tongue every 5 (five) minutes as needed for chest pain. Do not exceed 3 doses     omeprazole 20 MG capsule  Commonly known as:  PRILOSEC  Take 20 mg by mouth daily before supper. For reflux     polyethylene glycol packet  Commonly known as:  MIRALAX / GLYCOLAX  Take 17 g by mouth daily. For constipation     potassium chloride SA 20 MEQ tablet  Commonly known as:  K-DUR,KLOR-CON  Take 20 mEq by mouth daily.     senna-docusate 8.6-50 MG tablet  Commonly known as:  Senokot-S  Take 1 tablet by mouth daily. Hold for loose stools          Diet and Activity recommendation: See Discharge Instructions above   Consults obtained -  none   Major procedures and  Radiology Reports - PLEASE review detailed and final reports for all details, in brief -      Ct Head Wo Contrast  09/20/2015  CLINICAL DATA:  Initial evaluation for altered mental status, facial droop. EXAM: CT HEAD WITHOUT CONTRAST TECHNIQUE: Contiguous axial images were obtained from the base of the skull through the vertex without intravenous contrast. COMPARISON:  Prior CT from 12/02/2013. FINDINGS: Generalized cerebral atrophy similar to prior. Patchy and confluent hypodensity within the periventricular and deep white matter both cerebral hemispheres most consistent with chronic small vessel ischemic disease. Remote right frontal lobe infarct again noted. No acute intracranial hemorrhage. No acute large vessel territory infarct. No mass lesion, midline shift, or mass effect. No hydrocephalus. No extra-axial fluid collection. Scalp soft tissues demonstrate no acute abnormality. No acute abnormality about the globes and orbits. Paranasal sinuses are clear.  No mastoid effusion. Calvarium intact. IMPRESSION: 1. No acute intracranial process. 2. Remote right frontal lobe infarct, stable. 3. Generalized cerebral atrophy with moderate chronic microvascular ischemic disease, stable. Electronically Signed   By: Jeannine Boga M.D.   On: 09/20/2015 00:20   Dg Chest Portable 1 View  09/20/2015  CLINICAL DATA:  Initial evaluation for acute fever, weakness. EXAM: PORTABLE CHEST 1 VIEW COMPARISON:  Prior radiograph from 12/02/2013. FINDINGS: Cardiac and mediastinal silhouettes are stable in size and contour, and remain within normal limits. Atheromatous plaque within the aortic arch. Lungs are hypoinflated. Perihilar vascular congestion, which may be related to shallow lung inflation. There are patchy left basilar opacities, which may reflect atelectasis or infiltrates. No pulmonary edema or pleural effusion. No pneumothorax. No acute osseous abnormality. Osteopenia. Degenerative osteoarthrosis about the  shoulders. Surgical clips overlie the left axilla. IMPRESSION: 1. Shallow lung inflation with mild patchy left basilar opacity, which may reflect atelectasis or infiltrate. 2. No other active cardiopulmonary disease. 3. Aortic atherosclerosis peer Electronically Signed   By: Jeannine Boga M.D.   On: 09/20/2015 02:02    Micro Results   Recent Results (from the past 240 hour(s))  Urine culture     Status: Abnormal   Collection Time: 09/19/15 10:40 PM  Result Value Ref Range Status   Specimen Description URINE, CATHETERIZED  Final   Special Requests NONE  Final   Culture 60,000 COLONIES/mL ESCHERICHIA COLI (A)  Final   Report Status 09/22/2015 FINAL  Final   Organism ID, Bacteria ESCHERICHIA COLI (A)  Final      Susceptibility   Escherichia coli - MIC*    AMPICILLIN <=2 SENSITIVE Sensitive     CEFAZOLIN <=4 SENSITIVE Sensitive     CEFTRIAXONE <=1 SENSITIVE Sensitive     CIPROFLOXACIN <=0.25 SENSITIVE Sensitive     GENTAMICIN <=1 SENSITIVE Sensitive     IMIPENEM <=0.25 SENSITIVE Sensitive     NITROFURANTOIN <=16 SENSITIVE Sensitive     TRIMETH/SULFA <=20 SENSITIVE Sensitive     AMPICILLIN/SULBACTAM <=2 SENSITIVE Sensitive     PIP/TAZO <=4 SENSITIVE Sensitive     * 60,000 COLONIES/mL ESCHERICHIA COLI  MRSA PCR Screening     Status: None   Collection Time: 09/20/15  3:10 AM  Result Value Ref Range Status  MRSA by PCR NEGATIVE NEGATIVE Final    Comment:        The GeneXpert MRSA Assay (FDA approved for NASAL specimens only), is one component of a comprehensive MRSA colonization surveillance program. It is not intended to diagnose MRSA infection nor to guide or monitor treatment for MRSA infections.        Today   Subjective:   Amy Martin today With significant dementia, cannot provide any complaints.  Objective:   Blood pressure 146/63, pulse 71, temperature 98.7 F (37.1 C), temperature source Oral, resp. rate 16, height 5\' 4"  (1.626 m), weight 43 kg (94  lb 12.8 oz), SpO2 99 %.   Intake/Output Summary (Last 24 hours) at 09/22/15 1117 Last data filed at 09/22/15 0918  Gross per 24 hour  Intake   5490 ml  Output      0 ml  Net   5490 ml    Exam  Sleepy, but wakes up to loud verbal stimuli , frail, Confused Symmetrical Chest wall movement, Good air movement bilaterally,  RRR,No Gallops,Rubs or new Murmurs, No Parasternal Heave +ve B.Sounds, Abd Soft, No tenderness, No organomegaly appriciated,  No Cyanosis, Clubbing or edema, No new Rash or bruise   Data Review   CBC w Diff: Lab Results  Component Value Date   WBC 6.9 09/21/2015   WBC 3.8 08/31/2015   HGB 10.3* 09/21/2015   HCT 32.5* 09/21/2015   PLT 236 09/21/2015   LYMPHOPCT 5 09/19/2015   MONOPCT 7 09/19/2015   EOSPCT 0 09/19/2015   BASOPCT 0 09/19/2015    CMP: Lab Results  Component Value Date   NA 144 09/21/2015   NA 141 08/31/2015   K 3.9 09/21/2015   CL 112* 09/21/2015   CO2 27 09/21/2015   BUN 28* 09/21/2015   BUN 22* 08/31/2015   CREATININE 1.04* 09/21/2015   CREATININE 1.1 08/31/2015   GLU 155 08/31/2015   PROT 6.1* 09/19/2015   ALBUMIN 2.8* 09/19/2015   BILITOT 0.6 09/19/2015   ALKPHOS 70 09/19/2015   AST 23 09/19/2015   ALT 12* 09/19/2015  .   Total Time in preparing paper work, data evaluation and todays exam - 35 minutes  Ailish Prospero M.D on 09/22/2015 at 11:17 AM  [facility]   Office  [phone]

## 2015-09-22 NOTE — Clinical Social Work Placement (Signed)
   CLINICAL SOCIAL WORK PLACEMENT  NOTE  Date:  09/22/2015  Patient Details  Name: Amy Martin MRN: [id number] Date of Birth: 1920/03/16  Clinical Social Work is seeking post-discharge placement for this patient at the   level of care (*CSW will initial, date and re-position this form in  chart as items are completed):      Patient/family provided with [facility] Work Department's list of facilities offering this level of care within the geographic area requested by the patient (or if unable, by the patient's family).      Patient/family informed of their freedom to choose among providers that offer the needed level of care, that participate in Medicare, Medicaid or managed care program needed by the patient, have an available bed and are willing to accept the patient.      Patient/family informed of [facility]'s ownership interest in [facility] and [facility], as well as of the fact that they are under no obligation to receive care at these facilities.  PASRR submitted to EDS on       PASRR number received on       Existing PASRR number confirmed on       FL2 transmitted to all facilities in geographic area requested by pt/family on       FL2 transmitted to all facilities within larger geographic area on       Patient informed that his/her managed care company has contracts with or will negotiate with certain facilities, including the following:            Patient/family informed of bed offers received.  Patient chooses bed at       Physician recommends and patient chooses bed at      Patient to be transferred to   on  .  Patient to be transferred to facility by       Patient family notified on   of transfer.  Name of family member notified:        PHYSICIAN       Additional Comment:    _______________________________________________ Junie Spencer, LCSW 09/22/2015, 11:03 AM

## 2015-09-23 ENCOUNTER — Encounter: Payer: Self-pay | Admitting: Internal Medicine

## 2015-09-25 ENCOUNTER — Encounter: Payer: Self-pay | Admitting: Internal Medicine

## 2015-09-25 ENCOUNTER — Non-Acute Institutional Stay (SKILLED_NURSING_FACILITY): Payer: Medicare Other | Admitting: Internal Medicine

## 2015-09-25 DIAGNOSIS — J189 Pneumonia, unspecified organism: Secondary | ICD-10-CM

## 2015-09-25 DIAGNOSIS — I69191 Dysphagia following nontraumatic intracerebral hemorrhage: Secondary | ICD-10-CM

## 2015-09-25 DIAGNOSIS — D509 Iron deficiency anemia, unspecified: Secondary | ICD-10-CM

## 2015-09-25 DIAGNOSIS — I10 Essential (primary) hypertension: Secondary | ICD-10-CM | POA: Diagnosis not present

## 2015-09-25 DIAGNOSIS — N390 Urinary tract infection, site not specified: Secondary | ICD-10-CM | POA: Diagnosis not present

## 2015-09-25 DIAGNOSIS — E43 Unspecified severe protein-calorie malnutrition: Secondary | ICD-10-CM

## 2015-09-25 DIAGNOSIS — K219 Gastro-esophageal reflux disease without esophagitis: Secondary | ICD-10-CM

## 2015-09-25 DIAGNOSIS — Z8673 Personal history of transient ischemic attack (TIA), and cerebral infarction without residual deficits: Secondary | ICD-10-CM

## 2015-09-25 DIAGNOSIS — B9620 Unspecified Escherichia coli [E. coli] as the cause of diseases classified elsewhere: Secondary | ICD-10-CM

## 2015-09-25 DIAGNOSIS — G9341 Metabolic encephalopathy: Secondary | ICD-10-CM

## 2015-09-25 NOTE — Progress Notes (Signed)
MRN: [id number] Name: Amy Martin  Sex: female Age: 82 y.o. DOB: 20-May-1919  [place] #:  Facility/Room:[facility] / [address] Level Of Care: SNF Provider: Noah Delaine. Sheppard Coil, MD Emergency Contacts: Extended Emergency Contact Information Primary Emergency Contact: Delmore,[name] Address: [address]          [place], [place] [address] Johnnette Litter of [place] Phone: [phone] Mobile Phone: [phone] Relation: Son  Code Status: DNR  Allergies: Review of patient's allergies indicates no known allergies.  Chief Complaint  Patient presents with  . New Admit To SNF    Admit to Facility    HPI: Patient is 82 y.o. female with HTN, CVA with hemiplegia and chronic IDA who presents with altered mental status.Nursing home ordered a chest x-ray and CBC/BMP which were unremarkable, but later the patient was noted to be listless, sluggish, weak and with decreased responsiveness, and so was sent to the ER.Pt was admitted to [facility] from 6/21-24 where she was treated for HCAP and an E Coli UTI. PT is admitted back to SNF for residential care.Because she is FTT, son has made her a DNR at SNF also.  Past Medical History  Diagnosis Date  . Cancer ([place])   . Stroke ([place])   . Diabetes mellitus   . Dementia   . Hypertension   . Hyperlipidemia   . Depression   . GERD (gastroesophageal reflux disease)     No past surgical history on file.    Medication List       This list is accurate as of: 09/25/15  9:08 PM.  Always use your most recent med list.               acetaminophen 325 MG tablet  Commonly known as:  TYLENOL  Take 650 mg by mouth every 4 (four) hours as needed for mild pain.     aspirin EC 325 MG tablet  Take 325 mg by mouth daily. For CVA     bisacodyl 10 MG suppository  Commonly known as:  DULCOLAX  Place 10 mg rectally daily as needed for moderate constipation.     ENSURE  Take 1 Can by mouth 2 (two) times daily.     ferrous sulfate 325 (65 FE) MG tablet  Take 325  mg by mouth daily with breakfast. For iron deficiency     hydrALAZINE 25 MG tablet  Commonly known as:  APRESOLINE  Take 25 mg by mouth 4 (four) times daily. Give 1 tablet (25 mg) by mouth along with Hydralazine 50 mg for (75 mg) QID (Q6hr)     hydrALAZINE 50 MG tablet  Commonly known as:  APRESOLINE  Take 50 mg by mouth 4 (four) times daily. Give 1 table (50 mg) by mouth along with hydralazine 25 mg for (75 mg) QID (Q6hr)     magnesium hydroxide 400 MG/5ML suspension  Commonly known as:  MILK OF MAGNESIA  Take 30 mLs by mouth daily as needed for mild constipation.     metoprolol succinate 25 MG 24 hr tablet  Commonly known as:  TOPROL-XL  Take 25 mg by mouth 2 (two) times daily. At 0900 & 1700. Hold for HR less than 55.     multivitamin with minerals Tabs tablet  Take 1 tablet by mouth daily.     nitroGLYCERIN 0.4 MG SL tablet  Commonly known as:  NITROSTAT  Place 0.4 mg under the tongue every 5 (five) minutes as needed for chest pain. Do not exceed 3 doses  omeprazole 20 MG capsule  Commonly known as:  PRILOSEC  Take 20 mg by mouth daily before supper. For reflux     polyethylene glycol packet  Commonly known as:  MIRALAX / GLYCOLAX  Take 17 g by mouth daily. For constipation     potassium chloride SA 20 MEQ tablet  Commonly known as:  K-DUR,KLOR-CON  Take 20 mEq by mouth daily.     senna-docusate 8.6-50 MG tablet  Commonly known as:  Senokot-S  Take 1 tablet by mouth daily. Hold for loose stools        Meds ordered this encounter  Medications  . metoprolol succinate (TOPROL-XL) 25 MG 24 hr tablet    Sig: Take 25 mg by mouth 2 (two) times daily. At 0900 & 1700. Hold for HR less than 55.    Immunization History  Administered Date(s) Administered  . PPD Test 01/12/2012  . Pneumococcal Polysaccharide-23 12/04/2013    Social History  Substance Use Topics  . Smoking status: Former Smoker    Types: Cigarettes    Quit date: 01/04/1979  . Smokeless tobacco:  Not on file  . Alcohol Use: No    Family history is + DM  No family history on file.    Review of Systems  DATA OBTAINED: from  nurse, medical record,son GENERAL:  no fevers, fatigue, appetite changes SKIN: No itching, rash or wounds EYES: No eye pain, redness, discharge EARS: No earache, tinnitus, change in hearing NOSE: No congestion, drainage or bleeding  MOUTH/THROAT: No mouth or tooth pain, No sore throat RESPIRATORY: No cough, wheezing, SOB CARDIAC: No chest pain, palpitations, lower extremity edema  GI: No abdominal pain, No N/V/D or constipation, No heartburn or reflux  GU: No dysuria, frequency or urgency, + incontinence baseline MUSCULOSKELETAL: No unrelieved bone/joint pain NEUROLOGIC: No headache, dizziness or focal weakness PSYCHIATRIC: No c/o anxiety or sadness   Filed Vitals:   09/25/15 0840  BP: 142/64  Pulse: 87  Temp: 97.1 F (36.2 C)  Resp: 20    SpO2 Readings from Last 1 Encounters:  09/22/15 99%        Physical Exam  GENERAL APPEARANCE: Alert, conversant,  No acute distress.  SKIN: No diaphoresis rash HEAD: Normocephalic, atraumatic  EYES: Conjunctiva/lids clear. Pupils round, reactive. EOMs intact.  EARS: External exam WNL, canals clear. Hearing grossly normal.  NOSE: No deformity or discharge.  MOUTH/THROAT: Lips w/o lesions  RESPIRATORY: Breathing is even, unlabored. Lung sounds are clear   CARDIOVASCULAR: Heart RRR no murmurs, rubs or gallops. No peripheral edema.   GASTROINTESTINAL: Abdomen is soft, non-tender, not distended w/ normal bowel sounds. GENITOURINARY: Bladder non tender, not distended  MUSCULOSKELETAL: No abnormal joints or musculature NEUROLOGIC:  Cranial nerves 2-12 grossly intact. Moves all extremities  PSYCHIATRIC: MS back to baseline, no behavioral issues  Patient Active Problem List   Diagnosis Date Noted  . E. coli UTI (urinary tract infection) 09/25/2015  . HCAP (healthcare-associated pneumonia) 09/25/2015  .  Fever 09/20/2015  . Dehydration 09/20/2015  . Altered mental status 09/20/2015  . Dysphagia following nontraumatic intracerebral hemorrhage 07/13/2015  . Lip edema 06/18/2015  . History of CVA (cerebrovascular accident) 03/12/2015  . Cough 01/01/2015  . History of stroke 05/08/2014  . Constipation 02/27/2014  . Thrush 12/11/2013  . Protein-calorie malnutrition, severe ([place]) 12/04/2013  . Metabolic encephalopathy 99991111  . Seizure ([place]) 12/02/2013  . Esophageal reflux 07/22/2013  . CKD (chronic kidney disease) stage 3, GFR 30-59 ml/min 01/14/2013  . Anemia 11/02/2012  . Hemorrhoid  11/02/2012  . 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 Senile dementia, uncomplicated [id number]  . Iron deficiency anemia 08/13/2012  . Essential hypertension 01/06/2012  . CVA (cerebral infarction) 01/04/2012       Component Value Date/Time   WBC 6.9 09/21/2015 0244   WBC 6.9 09/21/2015   RBC 3.93 09/21/2015 0244   HGB 10.3* 09/21/2015 0244   HCT 32.5* 09/21/2015 0244   PLT 236 09/21/2015 0244   MCV 82.7 09/21/2015 0244   LYMPHSABS 0.4* 09/19/2015 2149   MONOABS 0.6 09/19/2015 2149   EOSABS 0.0 09/19/2015 2149   BASOSABS 0.0 09/19/2015 2149        Component Value Date/Time   NA 144 09/21/2015 0244   NA 142 09/19/2015   K 3.9 09/21/2015 0244   CL 112* 09/21/2015 0244   CO2 27 09/21/2015 0244   GLUCOSE 210* 09/21/2015 0244   BUN 28* 09/21/2015 0244   BUN 28* 09/21/2015   CREATININE 1.04* 09/21/2015 0244   CREATININE 1.0 09/21/2015   CALCIUM 9.6 09/21/2015 0244   PROT 6.1* 09/19/2015 2149   ALBUMIN 2.8* 09/19/2015 2149   AST 23 09/19/2015 2149   ALT 12* 09/19/2015 2149   ALKPHOS 70 09/19/2015 2149   BILITOT 0.6 09/19/2015 2149   GFRNONAA 45* 09/21/2015 0244   GFRAA 52* 09/21/2015 0244    Lab Results  Component Value Date   HGBA1C 5.2 09/21/2015    Lab Results  Component Value Date   CHOL 149 01/04/2012   HDL 67 01/04/2012   LDLCALC 70 01/04/2012   TRIG  60 01/04/2012   CHOLHDL 2.2 01/04/2012     Ct Head Wo Contrast  09/20/2015  CLINICAL DATA:  Initial evaluation for altered mental status, facial droop. EXAM: CT HEAD WITHOUT CONTRAST TECHNIQUE: Contiguous axial images were obtained from the base of the skull through the vertex without intravenous contrast. COMPARISON:  Prior CT from 12/02/2013. FINDINGS: Generalized cerebral atrophy similar to prior. Patchy and confluent hypodensity within the periventricular and deep white matter both cerebral hemispheres most consistent with chronic small vessel ischemic disease. Remote right frontal lobe infarct again noted. No acute intracranial hemorrhage. No acute large vessel territory infarct. No mass lesion, midline shift, or mass effect. No hydrocephalus. No extra-axial fluid collection. Scalp soft tissues demonstrate no acute abnormality. No acute abnormality about the globes and orbits. Paranasal sinuses are clear.  No mastoid effusion. Calvarium intact. IMPRESSION: 1. No acute intracranial process. 2. Remote right frontal lobe infarct, stable. 3. Generalized cerebral atrophy with moderate chronic microvascular ischemic disease, stable. Electronically Signed   By: Jeannine Boga M.D.   On: 09/20/2015 00:20   Dg Chest Portable 1 View  09/20/2015  CLINICAL DATA:  Initial evaluation for acute fever, weakness. EXAM: PORTABLE CHEST 1 VIEW COMPARISON:  Prior radiograph from 12/02/2013. FINDINGS: Cardiac and mediastinal silhouettes are stable in size and contour, and remain within normal limits. Atheromatous plaque within the aortic arch. Lungs are hypoinflated. Perihilar vascular congestion, which may be related to shallow lung inflation. There are patchy left basilar opacities, which may reflect atelectasis or infiltrates. No pulmonary edema or pleural effusion. No pneumothorax. No acute osseous abnormality. Osteopenia. Degenerative osteoarthrosis about the shoulders. Surgical clips overlie the left axilla.  IMPRESSION: 1. Shallow lung inflation with mild patchy left basilar opacity, which may reflect atelectasis or infiltrate. 2. No other active cardiopulmonary disease. 3. Aortic atherosclerosis peer Electronically Signed   By: Jeannine Boga M.D.   On: 09/20/2015 02:02    Not all labs,  radiology exams or other studies done during hospitalization come through on my EPIC note; however they are reviewed by me.    Assessment and Plan  Altered mental status and fever:  - Multifactorial AMS in setting of dehydration, pneumonia and UTI , on top of baseline advanced dementia . SNF - resolved;pt at baseline   Pneumonia - Treated with Rocephin and azithromycin SNF -  2 more days on oral Ceftin.  UTI - Urine culture growing Escherichia coli, sensitive, treated with Rocephin  SNF - ceftin for 2 more days  Debility - In wheelchair and longterm care at baseline..  Severe PCM:  SNF - Continue Ensure, encourage by mouth intake, remains to have poor oral intake, discussed with son, if patient continues to have poor oral intake, then goals of care should focus on palliative, would recommend palliative care consult at facility; pt's son has made her DNR  DYSPHAGIA SNF - pt with pureed and honey thick but she won't eat is so will go with chopped meat and nectar thick; pt has dysphagia waiver  CKD stage III:  SNF - will f/u BMP- Stable  HTN SNF - controlled;Continue hydralazine and metoprolol  Hx of CVA  SNF - stable- Continue aspirin 325  GERD:  SNF - cont prilosec 20 mg daily  Chronic iron def anemia: SNF - Continue iron; will f/u CBC    Time spent > 45 min;> 50% of time with patient was spent reviewing records, labs, tests and studies, counseling and developing plan of care  Webb Silversmith D. Sheppard Coil, MD

## 2015-09-26 ENCOUNTER — Non-Acute Institutional Stay (SKILLED_NURSING_FACILITY): Payer: Medicare Other | Admitting: Internal Medicine

## 2015-09-26 ENCOUNTER — Encounter: Payer: Self-pay | Admitting: Internal Medicine

## 2015-09-26 DIAGNOSIS — T783XXA Angioneurotic edema, initial encounter: Secondary | ICD-10-CM

## 2015-09-26 NOTE — Progress Notes (Signed)
MRN: [id number] Name: Amy Martin  Sex: female Age: 82 y.o. DOB: 03-14-1920  [place] #:  Facility/Room: [facility] [place]: SNF Provider: Noah Delaine. Sheppard Coil, MD Emergency Contacts: Extended Emergency Contact Information Primary Emergency Contact: Mondesir,[name] Address: [address]          [place], [place] [address] Johnnette Litter of [place] Phone: [phone] Mobile Phone: [phone] Relation: Son  Code Status: DNR  Allergies: Review of patient's allergies indicates no known allergies.  Chief Complaint  Patient presents with  . Acute Visit    Acute    HPI: Patient is 82 y.o. female who nursing has asked me to see for swelling of her lower lip noted this am. Pt is on no new meds or products. Not on ACE inhibitor. No itching to throat or SOB. No GI sx.  Past Medical History  Diagnosis Date  . Cancer ([place])   . Stroke ([place])   . Diabetes mellitus   . Dementia   . Hypertension   . Hyperlipidemia   . Depression   . GERD (gastroesophageal reflux disease)     History reviewed. No pertinent past surgical history.    Medication List       This list is accurate as of: 09/26/15 11:59 PM.  Always use your most recent med list.               acetaminophen 325 MG tablet  Commonly known as:  TYLENOL  Take 650 mg by mouth every 4 (four) hours as needed for mild pain.     aspirin EC 325 MG tablet  Take 325 mg by mouth daily. For CVA     bisacodyl 10 MG suppository  Commonly known as:  DULCOLAX  Place 10 mg rectally daily as needed for moderate constipation.     ENSURE  Take 1 Can by mouth 2 (two) times daily.     ferrous sulfate 325 (65 FE) MG tablet  Take 325 mg by mouth daily with breakfast. For iron deficiency     hydrALAZINE 25 MG tablet  Commonly known as:  APRESOLINE  Take 25 mg by mouth 4 (four) times daily. Give 1 tablet (25 mg) by mouth along with Hydralazine 50 mg for (75 mg) QID (Q6hr)     hydrALAZINE 50 MG tablet  Commonly known as:   APRESOLINE  Take 50 mg by mouth 4 (four) times daily. Give 1 table (50 mg) by mouth along with hydralazine 25 mg for (75 mg) QID (Q6hr)     magnesium hydroxide 400 MG/5ML suspension  Commonly known as:  MILK OF MAGNESIA  Take 30 mLs by mouth daily as needed for mild constipation.     metoprolol succinate 25 MG 24 hr tablet  Commonly known as:  TOPROL-XL  Take 25 mg by mouth 2 (two) times daily. At 0900 & 1700. Hold for HR less than 55.     multivitamin with minerals Tabs tablet  Take 1 tablet by mouth daily.     nitroGLYCERIN 0.4 MG SL tablet  Commonly known as:  NITROSTAT  Place 0.4 mg under the tongue every 5 (five) minutes as needed for chest pain. Do not exceed 3 doses     omeprazole 20 MG capsule  Commonly known as:  PRILOSEC  Take 20 mg by mouth daily before supper. For reflux     polyethylene glycol packet  Commonly known as:  MIRALAX / GLYCOLAX  Take 17 g by mouth daily. For constipation  potassium chloride SA 20 MEQ tablet  Commonly known as:  K-DUR,KLOR-CON  Take 20 mEq by mouth daily.     senna-docusate 8.6-50 MG tablet  Commonly known as:  Senokot-S  Take 1 tablet by mouth daily. Hold for loose stools        No orders of the defined types were placed in this encounter.    Immunization History  Administered Date(s) Administered  . PPD Test 01/12/2012  . Pneumococcal Polysaccharide-23 12/04/2013    Social History  Substance Use Topics  . Smoking status: Former Smoker    Types: Cigarettes    Quit date: 01/04/1979  . Smokeless tobacco: Not on file  . Alcohol Use: No    Review of Systems-pt can not cooperate fully;nursing per HPI    Filed Vitals:   09/26/15 1526  BP: 141/79  Pulse: 78  Temp: 98.1 F (36.7 C)  Resp: 18    Physical Exam  GENERAL APPEARANCE: Alert,min conversant, No acute distress ; in DR in [facility] SKIN: No diaphoresis rash HEENT: mod swelling lower lip RESPIRATORY: Breathing is even, unlabored. Lung sounds are clear    CARDIOVASCULAR: Heart RRR no murmurs, rubs or gallops. No peripheral edema  GASTROINTESTINAL: Abdomen is soft, non-tender, not distended w/ normal bowel sounds.  GENITOURINARY: Bladder non tender, not distended  MUSCULOSKELETAL: No abnormal joints or musculature NEUROLOGIC: Cranial nerves 2-12 grossly intact. Moves all extremities PSYCHIATRIC: dementia, no behavioral issues  Patient Active Problem List   Diagnosis Date Noted  . E. coli UTI (urinary tract infection) 09/25/2015  . HCAP (healthcare-associated pneumonia) 09/25/2015  . Fever 09/20/2015  . Dehydration 09/20/2015  . Altered mental status 09/20/2015  . Dysphagia following nontraumatic intracerebral hemorrhage 07/13/2015  . Lip edema 06/18/2015  . History of CVA (cerebrovascular accident) 03/12/2015  . Cough 01/01/2015  . History of stroke 05/08/2014  . Constipation 02/27/2014  . Thrush 12/11/2013  . Protein-calorie malnutrition, severe ([place]) 12/04/2013  . Metabolic encephalopathy 99991111  . Seizure ([place]) 12/02/2013  . Esophageal reflux 07/22/2013  . CKD (chronic kidney disease) stage 3, GFR 30-59 ml/min 01/14/2013  . Anemia 11/02/2012  . Hemorrhoid 11/02/2012  . 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 Senile dementia, uncomplicated [id number]  . Iron deficiency anemia 08/13/2012  . Essential hypertension 01/06/2012  . CVA (cerebral infarction) 01/04/2012    CBC    Component Value Date/Time   WBC 4.1 09/27/2015 1858   WBC 6.9 09/21/2015   RBC 3.55* 09/27/2015 1858   HGB 10.5* 09/27/2015 1909   HCT 31.0* 09/27/2015 1909   PLT 324 09/27/2015 1858   MCV 83.1 09/27/2015 1858   LYMPHSABS 0.9 09/27/2015 1858   MONOABS 0.4 09/27/2015 1858   EOSABS 0.1 09/27/2015 1858   BASOSABS 0.0 09/27/2015 1858    CMP     Component Value Date/Time   NA 139 09/27/2015 1909   NA 142 09/19/2015   K 4.1 09/27/2015 1909   CL 104 09/27/2015 1909   CO2 23 09/27/2015 1858   GLUCOSE 70 09/27/2015 1909   BUN 17  09/27/2015 1909   BUN 28* 09/21/2015   CREATININE 0.90 09/27/2015 1909   CREATININE 1.0 09/21/2015   CALCIUM 8.8* 09/27/2015 1858   PROT 6.2* 09/27/2015 1858   ALBUMIN 2.5* 09/27/2015 1858   AST 18 09/27/2015 1858   ALT 14 09/27/2015 1858   ALKPHOS 71 09/27/2015 1858   BILITOT 0.5 09/27/2015 1858   GFRNONAA 48* 09/27/2015 1858   GFRAA 55* 09/27/2015 1858    Assessment and Plan  ANGIOEDEMA LIP -NO NEW MEDS, EXPOSURES. nOT ON ace INHIBITOR; hae written for benadryl and zantac for 5 days; will monitor   Time spent > 25 min;> 50% of time with patient was spent reviewing records, labs, tests and studies, counseling and developing plan of care  Webb Silversmith D. Sheppard Coil, MD

## 2015-09-27 ENCOUNTER — Emergency Department (HOSPITAL_COMMUNITY): Payer: Medicare Other

## 2015-09-27 ENCOUNTER — Non-Acute Institutional Stay (SKILLED_NURSING_FACILITY): Payer: Medicare Other | Admitting: Internal Medicine

## 2015-09-27 ENCOUNTER — Encounter (HOSPITAL_COMMUNITY): Payer: Self-pay | Admitting: Emergency Medicine

## 2015-09-27 ENCOUNTER — Encounter: Payer: Self-pay | Admitting: Internal Medicine

## 2015-09-27 ENCOUNTER — Emergency Department (HOSPITAL_COMMUNITY)
Admission: EM | Admit: 2015-09-27 | Discharge: 2015-09-28 | Disposition: A | Discharge status: Home / Self Care | Payer: Medicare Other | Attending: Emergency Medicine | Admitting: Emergency Medicine

## 2015-09-27 DIAGNOSIS — Z87891 Personal history of nicotine dependence: Secondary | ICD-10-CM | POA: Insufficient documentation

## 2015-09-27 DIAGNOSIS — Y69 Unspecified misadventure during surgical and medical care: Secondary | ICD-10-CM | POA: Insufficient documentation

## 2015-09-27 DIAGNOSIS — I10 Essential (primary) hypertension: Secondary | ICD-10-CM | POA: Diagnosis not present

## 2015-09-27 DIAGNOSIS — F0390 Unspecified dementia without behavioral disturbance: Secondary | ICD-10-CM | POA: Insufficient documentation

## 2015-09-27 DIAGNOSIS — T887XXA Unspecified adverse effect of drug or medicament, initial encounter: Secondary | ICD-10-CM | POA: Insufficient documentation

## 2015-09-27 DIAGNOSIS — T50905A Adverse effect of unspecified drugs, medicaments and biological substances, initial encounter: Secondary | ICD-10-CM

## 2015-09-27 DIAGNOSIS — R400 Somnolence: Secondary | ICD-10-CM

## 2015-09-27 DIAGNOSIS — Z8673 Personal history of transient ischemic attack (TIA), and cerebral infarction without residual deficits: Secondary | ICD-10-CM | POA: Insufficient documentation

## 2015-09-27 DIAGNOSIS — F329 Major depressive disorder, single episode, unspecified: Secondary | ICD-10-CM | POA: Diagnosis not present

## 2015-09-27 DIAGNOSIS — R4182 Altered mental status, unspecified: Secondary | ICD-10-CM | POA: Diagnosis present

## 2015-09-27 DIAGNOSIS — E119 Type 2 diabetes mellitus without complications: Secondary | ICD-10-CM | POA: Diagnosis not present

## 2015-09-27 DIAGNOSIS — Z79899 Other long term (current) drug therapy: Secondary | ICD-10-CM | POA: Insufficient documentation

## 2015-09-27 DIAGNOSIS — E785 Hyperlipidemia, unspecified: Secondary | ICD-10-CM | POA: Insufficient documentation

## 2015-09-27 DIAGNOSIS — R404 Transient alteration of awareness: Secondary | ICD-10-CM | POA: Diagnosis not present

## 2015-09-27 DIAGNOSIS — R4189 Other symptoms and signs involving cognitive functions and awareness: Secondary | ICD-10-CM

## 2015-09-27 DIAGNOSIS — Z859 Personal history of malignant neoplasm, unspecified: Secondary | ICD-10-CM | POA: Insufficient documentation

## 2015-09-27 DIAGNOSIS — T450X5A Adverse effect of antiallergic and antiemetic drugs, initial encounter: Secondary | ICD-10-CM | POA: Diagnosis not present

## 2015-09-27 DIAGNOSIS — Z7982 Long term (current) use of aspirin: Secondary | ICD-10-CM | POA: Insufficient documentation

## 2015-09-27 LAB — CBC WITH DIFFERENTIAL/PLATELET
BASOS ABS: 0 10*3/uL (ref 0.0–0.1)
Basophils Relative: 0 %
EOS PCT: 2 %
Eosinophils Absolute: 0.1 10*3/uL (ref 0.0–0.7)
HCT: 29.5 % — ABNORMAL LOW (ref 36.0–46.0)
HEMOGLOBIN: 9.8 g/dL — AB (ref 12.0–15.0)
LYMPHS PCT: 22 %
Lymphs Abs: 0.9 10*3/uL (ref 0.7–4.0)
MCH: 27.6 pg (ref 26.0–34.0)
MCHC: 33.2 g/dL (ref 30.0–36.0)
MCV: 83.1 fL (ref 78.0–100.0)
Monocytes Absolute: 0.4 10*3/uL (ref 0.1–1.0)
Monocytes Relative: 9 %
Neutro Abs: 2.7 10*3/uL (ref 1.7–7.7)
Neutrophils Relative %: 67 %
PLATELETS: 324 10*3/uL (ref 150–400)
RBC: 3.55 MIL/uL — AB (ref 3.87–5.11)
RDW: 15.4 % (ref 11.5–15.5)
WBC: 4.1 10*3/uL (ref 4.0–10.5)

## 2015-09-27 LAB — COMPREHENSIVE METABOLIC PANEL
ALK PHOS: 71 U/L (ref 38–126)
ALT: 14 U/L (ref 14–54)
AST: 18 U/L (ref 15–41)
Albumin: 2.5 g/dL — ABNORMAL LOW (ref 3.5–5.0)
Anion gap: 8 (ref 5–15)
BILIRUBIN TOTAL: 0.5 mg/dL (ref 0.3–1.2)
BUN: 16 mg/dL (ref 6–20)
CO2: 23 mmol/L (ref 22–32)
Calcium: 8.8 mg/dL — ABNORMAL LOW (ref 8.9–10.3)
Chloride: 106 mmol/L (ref 101–111)
Creatinine, Ser: 0.98 mg/dL (ref 0.44–1.00)
GFR calc Af Amer: 55 mL/min — ABNORMAL LOW (ref >60)
GFR, EST NON AFRICAN AMERICAN: 48 mL/min — AB (ref >60)
GLUCOSE: 74 mg/dL (ref 65–99)
POTASSIUM: 4 mmol/L (ref 3.5–5.1)
SODIUM: 137 mmol/L (ref 135–145)
TOTAL PROTEIN: 6.2 g/dL — AB (ref 6.5–8.1)

## 2015-09-27 LAB — I-STAT CHEM 8, ED
BUN: 17 mg/dL (ref 6–20)
CALCIUM ION: 1.25 mmol/L — AB (ref 1.12–1.23)
CHLORIDE: 104 mmol/L (ref 101–111)
Creatinine, Ser: 0.9 mg/dL (ref 0.44–1.00)
Glucose, Bld: 70 mg/dL (ref 65–99)
HEMATOCRIT: 31 % — AB (ref 36.0–46.0)
HEMOGLOBIN: 10.5 g/dL — AB (ref 12.0–15.0)
POTASSIUM: 4.1 mmol/L (ref 3.5–5.1)
Sodium: 139 mmol/L (ref 135–145)
TCO2: 25 mmol/L (ref 0–100)

## 2015-09-27 LAB — URINALYSIS, ROUTINE W REFLEX MICROSCOPIC
Bilirubin Urine: NEGATIVE
GLUCOSE, UA: NEGATIVE mg/dL
HGB URINE DIPSTICK: NEGATIVE
KETONES UR: 40 mg/dL — AB
LEUKOCYTES UA: NEGATIVE
Nitrite: NEGATIVE
PH: 5 (ref 5.0–8.0)
PROTEIN: NEGATIVE mg/dL
Specific Gravity, Urine: 1.013 (ref 1.005–1.030)

## 2015-09-27 LAB — AMMONIA

## 2015-09-27 LAB — CBG MONITORING, ED: GLUCOSE-CAPILLARY: 85 mg/dL (ref 65–99)

## 2015-09-27 LAB — I-STAT CG4 LACTIC ACID, ED: Lactic Acid, Venous: 1.15 mmol/L (ref 0.5–1.9)

## 2015-09-27 NOTE — Progress Notes (Signed)
Location:   [facility] Room Number: 305/W Place of Service:  SNF (31) Provider:  Henreitta Leber, MD  Patient Care Team: Hennie Duos, MD as PCP - General (Internal Medicine)  Extended Emergency Contact Information Primary Emergency Contact: Schweitzer,[name] Address: [address]          [place], [place] [address] Johnnette Litter of [place] Phone: [phone] Mobile Phone: [phone] Relation: Son  Code Status:  DNR Goals of care: Advanced Directive information Advanced Directives 09/27/2015  Does patient have an advance directive? Yes  Type of Paramedic of [place];Out of facility DNR (pink MOST or yellow form)  Does patient want to make changes to advanced directive? No - Patient declined  Copy of advanced directive(s) in chart? Yes     Chief Complaint  Patient presents with  . Acute Visit    Unresponsive    HPI:  Pt is a 82 y.o. female seen today for an acute visit forUnresponsiveness.  She was recently hospitalized with sepsis thought to be secondary to pneumonia and UTI-complicated with significant debility.  Apparently patient was in her usual state yesterday and early today up in the wheelchair-although apparently she was somewhat more lethargic today.  She also was seen yesterday apparently for some lip edema which she has developed on times-she does have a Benadryl order apparently last received Benadryl earlier today.  Currently she is lying in bed does not appear to be in any distress but is minimally responsive at times will grimace to painful stimuli.  Vital signs appear to be stable her blood sugar is low normal however at 69.  She has been given glucagon.  O2 saturations are in the 90s on room air-blood pressure 122/64 pulse is in the 80s.      Past Medical History  Diagnosis Date  . Cancer ([place])   . Stroke ([place])   . Diabetes mellitus   . Dementia   . Hypertension   . Hyperlipidemia   .  Depression   . GERD (gastroesophageal reflux disease)    History reviewed. No pertinent past surgical history.  No Known Allergies    Medication List       This list is accurate as of: 09/27/15  4:27 PM.  Always use your most recent med list.               acetaminophen 325 MG tablet  Commonly known as:  TYLENOL  Take 650 mg by mouth every 4 (four) hours as needed for mild pain.     aspirin EC 325 MG tablet  Take 325 mg by mouth daily. For CVA     bisacodyl 10 MG suppository  Commonly known as:  DULCOLAX  Place 10 mg rectally daily as needed for moderate constipation.     ENSURE  Take 1 Can by mouth 2 (two) times daily.     ferrous sulfate 325 (65 FE) MG tablet  Take 325 mg by mouth daily with breakfast. For iron deficiency     hydrALAZINE 25 MG tablet  Commonly known as:  APRESOLINE  Take 25 mg by mouth 4 (four) times daily. Give 1 tablet (25 mg) by mouth along with Hydralazine 50 mg for (75 mg) QID (Q6hr)     hydrALAZINE 50 MG tablet  Commonly known as:  APRESOLINE  Take 50 mg by mouth 4 (four) times daily. Give 1 table (50 mg) by mouth along with hydralazine 25 mg for (75 mg) QID (  Q6hr)     magnesium hydroxide 400 MG/5ML suspension  Commonly known as:  MILK OF MAGNESIA  Take 30 mLs by mouth daily as needed for mild constipation.     metoprolol succinate 25 MG 24 hr tablet  Commonly known as:  TOPROL-XL  Take 25 mg by mouth 2 (two) times daily. At 0900 & 1700. Hold for HR less than 55.     multivitamin with minerals Tabs tablet  Take 1 tablet by mouth daily.     nitroGLYCERIN 0.4 MG SL tablet  Commonly known as:  NITROSTAT  Place 0.4 mg under the tongue every 5 (five) minutes as needed for chest pain. Do not exceed 3 doses     omeprazole 20 MG capsule  Commonly known as:  PRILOSEC  Take 20 mg by mouth daily before supper. For reflux     polyethylene glycol packet  Commonly known as:  MIRALAX / GLYCOLAX  Take 17 g by mouth daily. For constipation      potassium chloride SA 20 MEQ tablet  Commonly known as:  K-DUR,KLOR-CON  Take 20 mEq by mouth daily.     senna-docusate 8.6-50 MG tablet  Commonly known as:  Senokot-S  Take 1 tablet by mouth daily. Hold for loose stools        Review of Systems   Essentially unattainable secondary to patient's relatively unresponsive state  Immunization History  Administered Date(s) Administered  . PPD Test 01/12/2012  . Pneumococcal Polysaccharide-23 12/04/2013   Pertinent  Health Maintenance Due  Topic Date Due  . DEXA SCAN  03/31/2016 (Originally 07/13/1986)  . INFLUENZA VACCINE  06/27/2016 (Originally 10/30/2015)  . PNA vac Low Risk Adult (2 of 2 - PCV13) 06/27/2016 (Originally 12/05/2014)   No flowsheet data found. Functional Status Survey:    Filed Vitals:   09/27/15 1620  BP: 145/72  Pulse: 75  Temp: 98.2 F (36.8 C)  TempSrc: Oral  Resp: 18  Height: 5\' 4"  (1.626 m)  Weight: 95 lb (43.092 kg)  Of note update blood pressure is 122/64 pulse is 84 respirations 16 she continues to be afebrile with axillary temperature of 97.3 O2 saturation is in the 90s on room air her CBG is 69 Body mass index is 16.3 kg/(m^2). Physical Exam   In general this a frail elderly female lying in bed she is minimally responsive to painful stimuli is not speaking.  Her skin is warm and dry.  Eyes pupils appear reactive but slightly sluggish.  Oropharynx could not really be assessed the patient would not open her mouth she does have some sputum evident. Lower lip seems slightly swollen  Chest poor respiratory effort but could not appreciate overt congestion heart is regular rate and rhythm without murmur gallop or rub she does not appear to have significant lower extremity edema.  Her abdomen is soft does not appear to be acutely tender although patient is not responding much-I'll sounds are positive.  Musculoskeletal she does have at times spontaneous movement more so over upper extremities she does  not really follow verbal commands    Neurologic is difficult exam she does have some spontaneous movement more so  upper extremities-does make facial grimacing at times with painful stimuli  Psych-again she is not speaking is minimally responsive  Labs reviewed:  Recent Labs  09/19/15 2149 09/20/15 0349 09/21/15 09/21/15 0244  NA 142 142  --  144  K 4.8 4.0  --  3.9  CL 108 109  --  112*  CO2  26 25  --  27  GLUCOSE 171* 232*  --  210*  BUN 37* 32* 28* 28*  CREATININE 1.36* 1.08*  1.06* 1.0 1.04*  CALCIUM 9.9 9.6  --  9.6    Recent Labs  04/20/15 06/20/15 09/19/15 2149  AST 19 13 23   ALT 9 7 12*  ALKPHOS 50 67 70  BILITOT  --   --  0.6  PROT  --   --  6.1*  ALBUMIN  --   --  2.8*    Recent Labs  09/19/15 2149 09/20/15 0349 09/21/15 09/21/15 0244  WBC 8.3 9.5  9.4 6.9 6.9  NEUTROABS 7.4  --   --   --   HGB 10.9* 10.4*  10.5*  --  10.3*  HCT 33.9* 32.4*  33.0*  --  32.5*  MCV 84.3 84.4  84.0  --  82.7  PLT 205 205  212  --  236   Lab Results  Component Value Date   TSH 0.75 05/10/2015   Lab Results  Component Value Date   HGBA1C 5.2 09/21/2015   Lab Results  Component Value Date   CHOL 149 01/04/2012   HDL 67 01/04/2012   LDLCALC 70 01/04/2012   TRIG 60 01/04/2012   CHOLHDL 2.2 01/04/2012    Significant Diagnostic Results in last 30 days:  Ct Head Wo Contrast  09/20/2015  CLINICAL DATA:  Initial evaluation for altered mental status, facial droop. EXAM: CT HEAD WITHOUT CONTRAST TECHNIQUE: Contiguous axial images were obtained from the base of the skull through the vertex without intravenous contrast. COMPARISON:  Prior CT from 12/02/2013. FINDINGS: Generalized cerebral atrophy similar to prior. Patchy and confluent hypodensity within the periventricular and deep white matter both cerebral hemispheres most consistent with chronic small vessel ischemic disease. Remote right frontal lobe infarct again noted. No acute intracranial hemorrhage. No acute  large vessel territory infarct. No mass lesion, midline shift, or mass effect. No hydrocephalus. No extra-axial fluid collection. Scalp soft tissues demonstrate no acute abnormality. No acute abnormality about the globes and orbits. Paranasal sinuses are clear.  No mastoid effusion. Calvarium intact. IMPRESSION: 1. No acute intracranial process. 2. Remote right frontal lobe infarct, stable. 3. Generalized cerebral atrophy with moderate chronic microvascular ischemic disease, stable. Electronically Signed   By: Jeannine Boga M.D.   On: 09/20/2015 00:20   Dg Chest Portable 1 View  09/20/2015  CLINICAL DATA:  Initial evaluation for acute fever, weakness. EXAM: PORTABLE CHEST 1 VIEW COMPARISON:  Prior radiograph from 12/02/2013. FINDINGS: Cardiac and mediastinal silhouettes are stable in size and contour, and remain within normal limits. Atheromatous plaque within the aortic arch. Lungs are hypoinflated. Perihilar vascular congestion, which may be related to shallow lung inflation. There are patchy left basilar opacities, which may reflect atelectasis or infiltrates. No pulmonary edema or pleural effusion. No pneumothorax. No acute osseous abnormality. Osteopenia. Degenerative osteoarthrosis about the shoulders. Surgical clips overlie the left axilla. IMPRESSION: 1. Shallow lung inflation with mild patchy left basilar opacity, which may reflect atelectasis or infiltrate. 2. No other active cardiopulmonary disease. 3. Aortic atherosclerosis peer Electronically Signed   By: Jeannine Boga M.D.   On: 09/20/2015 02:02    Assessment/Plan  Altered mental status with increased somnolence minimally responsive-concern here for an acute process difficult to fully tell etiology-she does have recent history of pneumonia UTI and sepsis which may be contributing to this-she also does have a history of CVA.  Per nursing she has received Benadryl for lip swelling  although has been some time since she is receive  this.  At this point we will sent to the ER for expedient evaluation-she did receive glucose supplementation and blood sugar when EMS arrived had risen slightly up to 71.  Again will await ER evaluation.  Patient did receive serial exams before EMS arrived her condition appear to be essentially unchanged again grimacing with painful stimuli but not speaking not really responding much otherwise she did at times continue with some spontaneous movements  [id number]    Oralia Manis, [place]

## 2015-09-27 NOTE — ED Notes (Signed)
Bed: [facility]ALD Expected date:  Expected time:  Means of arrival:  Comments: 

## 2015-09-27 NOTE — ED Notes (Signed)
Amy Martin [phone]

## 2015-09-27 NOTE — ED Notes (Signed)
Attempted to call SNF ([facility] x 2) to give report, no answer. PTAR notified.

## 2015-09-27 NOTE — ED Notes (Signed)
Bed: WA18 Expected date:  Expected time:  Means of arrival:  Comments: [facility] 

## 2015-09-27 NOTE — ED Provider Notes (Signed)
CSN: [id number]     Arrival date & time 09/27/15  1700 History   First MD Initiated Contact with Patient 09/27/15 1714     Chief Complaint  Patient presents with  . Altered Mental Status     HPI  Patient presents for evaluation of decreased level of consciousness. Patient was just recently admitted for a possible UTI and pneumonia. Completed a course of antibiotics. Was discharged back to her skilled nursing. In review of her discharge information patient was still minimally interactive and barely eating at discharge and paly of care consideration was requested to family.  Family states that she has had good days and bad days since then. Sunday she is interacting with physical therapy. The base is only eating a bite or 2 at a time of her meals.  They were called by the care facility today. They were told they thought she had some swelling of her lip she was given oral Benadryl. Then she became difficult to arouse.  Upon arrival here the family states her lip does not appear swollen that she does not have her dentures in.  Past Medical History  Diagnosis Date  . Cancer ([place])   . Stroke ([place])   . Diabetes mellitus   . Dementia   . Hypertension   . Hyperlipidemia   . Depression   . GERD (gastroesophageal reflux disease)    History reviewed. No pertinent past surgical history. No family history on file. Social History  Substance Use Topics  . Smoking status: Former Smoker    Types: Cigarettes    Quit date: 01/04/1979  . Smokeless tobacco: None  . Alcohol Use: No   OB History    No data available     Review of Systems  Unable to perform ROS: Dementia      Allergies  Review of patient's allergies indicates no known allergies.  Home Medications   Prior to Admission medications   Medication Sig Start Date End Date Taking? Authorizing Provider  acetaminophen (TYLENOL) 325 MG tablet Take 650 mg by mouth every 4 (four) hours as needed for mild pain.     Historical  Provider, MD  aspirin EC 325 MG tablet Take 325 mg by mouth daily. For CVA    Historical Provider, MD  bisacodyl (DULCOLAX) 10 MG suppository Place 10 mg rectally daily as needed for moderate constipation.    Historical Provider, MD  ENSURE (ENSURE) Take 1 Can by mouth 2 (two) times daily.    Historical Provider, MD  ferrous sulfate 325 (65 FE) MG tablet Take 325 mg by mouth daily with breakfast. For iron deficiency    Historical Provider, MD  hydrALAZINE (APRESOLINE) 25 MG tablet Take 25 mg by mouth 4 (four) times daily. Give 1 tablet (25 mg) by mouth along with Hydralazine 50 mg for (75 mg) QID (Q6hr)    Historical Provider, MD  hydrALAZINE (APRESOLINE) 50 MG tablet Take 50 mg by mouth 4 (four) times daily. Give 1 table (50 mg) by mouth along with hydralazine 25 mg for (75 mg) QID (Q6hr)    Historical Provider, MD  magnesium hydroxide (MILK OF MAGNESIA) 400 MG/5ML suspension Take 30 mLs by mouth daily as needed for mild constipation.    Historical Provider, MD  metoprolol succinate (TOPROL-XL) 25 MG 24 hr tablet Take 25 mg by mouth 2 (two) times daily. At 0900 & 1700. Hold for HR less than 55.    Historical Provider, MD  Multiple Vitamin (MULTIVITAMIN WITH MINERALS) TABS Take 1 tablet  by mouth daily.    Historical Provider, MD  nitroGLYCERIN (NITROSTAT) 0.4 MG SL tablet Place 0.4 mg under the tongue every 5 (five) minutes as needed for chest pain. Do not exceed 3 doses    Historical Provider, MD  omeprazole (PRILOSEC) 20 MG capsule Take 20 mg by mouth daily before supper. For reflux    Historical Provider, MD  polyethylene glycol (MIRALAX / GLYCOLAX) packet Take 17 g by mouth daily. For constipation    Historical Provider, MD  potassium chloride SA (K-DUR,KLOR-CON) 20 MEQ tablet Take 20 mEq by mouth daily.    Historical Provider, MD  senna-docusate (SENOKOT-S) 8.6-50 MG per tablet Take 1 tablet by mouth daily. Hold for loose stools    Historical Provider, MD   BP 176/65 mmHg  Pulse 65  Temp(Src)  97.4 F (36.3 C) (Rectal)  Resp 18  SpO2 100% Physical Exam  Constitutional: She appears cachectic. No distress.  Open her eyes to loud voice. Will squeeze with both hands. No obvious facial droop.  Overall thin and emaciated  HENT:  Head: Normocephalic.  Eyes: Conjunctivae are normal. Pupils are equal, round, and reactive to light. No scleral icterus.  Neck: Normal range of motion. Neck supple. No thyromegaly present.  Cardiovascular: Normal rate and regular rhythm.  Exam reveals no gallop and no friction rub.   No murmur heard. Pulmonary/Chest: Effort normal and breath sounds normal. No respiratory distress. She has no wheezes. She has no rales.  Abdominal: Soft. Bowel sounds are normal. She exhibits no distension. There is no tenderness. There is no rebound.  Musculoskeletal: Normal range of motion.  Neurological:  Move all 4 extremities.  Skin: Skin is warm and dry. No rash noted.    ED Course  Procedures (including critical care time) Labs Review Labs Reviewed  CBC WITH DIFFERENTIAL/PLATELET - Abnormal; Notable for the following:    RBC 3.55 (*)    Hemoglobin 9.8 (*)    HCT 29.5 (*)    All other components within normal limits  COMPREHENSIVE METABOLIC PANEL - Abnormal; Notable for the following:    Calcium 8.8 (*)    Total Protein 6.2 (*)    Albumin 2.5 (*)    GFR calc non Af Amer 48 (*)    GFR calc Af Amer 55 (*)    All other components within normal limits  AMMONIA - Abnormal; Notable for the following:    Ammonia <9 (*)    All other components within normal limits  URINALYSIS, ROUTINE W REFLEX MICROSCOPIC (NOT AT [facility]) - Abnormal; Notable for the following:    Ketones, ur 40 (*)    All other components within normal limits  I-STAT CHEM 8, ED - Abnormal; Notable for the following:    Calcium, Ion 1.25 (*)    Hemoglobin 10.5 (*)    HCT 31.0 (*)    All other components within normal limits  URINE CULTURE  CBG MONITORING, ED  I-STAT CG4 LACTIC ACID, ED     Imaging Review Ct Head Wo Contrast  09/27/2015  CLINICAL DATA:  82 year old female with altered mental status and unresponsive. EXAM: CT HEAD WITHOUT CONTRAST TECHNIQUE: Contiguous axial images were obtained from the base of the skull through the vertex without intravenous contrast. COMPARISON:  09/19/2015 and prior exams FINDINGS: Atrophy and severe chronic small-vessel white matter ischemic changes are again identified. No acute intracranial abnormalities are identified, including mass lesion or mass effect, hydrocephalus, extra-axial fluid collection, midline shift, hemorrhage, or acute infarction. The visualized bony calvarium  is unremarkable. IMPRESSION: No evidence of acute intracranial abnormality. Atrophy and chronic small-vessel white matter ischemic changes. Electronically Signed   By: Margarette Canada M.D.   On: 09/27/2015 20:15   Dg Chest Port 1 View  09/27/2015  CLINICAL DATA:  Weakness.  Altered mental status. EXAM: PORTABLE CHEST 1 VIEW COMPARISON:  09/20/2015 FINDINGS: Unchanged moderate aortic tortuosity. Normal heart size. Normal pulmonary vasculature. No airspace consolidation. No large effusions. IMPRESSION: No acute findings. Electronically Signed   By: Andreas Newport M.D.   On: 09/27/2015 22:02   I have personally reviewed and evaluated these images and lab results as part of my medical decision-making.   EKG Interpretation None      MDM   Final diagnoses:  Dementia, without behavioral disturbance  Medication side effects, initial encounter   Patient is speaking a few words at a time with her family as they're here. Ultimately the family is to go home. Patient studies are normal. Urine does not appear infected. No parapneumonic. Normal CT.   The head indication for admission. Think she is appropriate for discharge back to her care facility. I agree with discharging the family to consider palliative care. Avoid Benadryl. These are likely affects of the patient's  dementia and some toxic metabolic effects of her Benadryl and her age.    Tanna Furry, MD 09/27/15 2230

## 2015-09-27 NOTE — Discharge Instructions (Signed)
Avoid Benadryl.  Primary care evaluation with any new or worsening symptoms.

## 2015-09-27 NOTE — ED Notes (Signed)
Unable to get blood from IV placement

## 2015-09-27 NOTE — ED Notes (Signed)
Per EMS pt comes from [facility] for [employer].  Patient was found unresponsive by staff at facility but states "just prior she was fine".  Staff checked CBG 64 so they administered 1/2 tube insta-glucose.  EMS arrived and CBG was 68 so gave her 12.5g D50 via IV.  Patient CBG 85 but patient still only responsive to pain. SNF staff reported to EMS that patient isnt diabetic, but per paper work patient is DM type 2.

## 2015-09-29 ENCOUNTER — Encounter: Payer: Self-pay | Admitting: Internal Medicine

## 2015-09-29 LAB — URINE CULTURE: CULTURE: NO GROWTH

## 2015-10-01 ENCOUNTER — Encounter: Payer: Self-pay | Admitting: Internal Medicine

## 2015-10-01 ENCOUNTER — Non-Acute Institutional Stay (SKILLED_NURSING_FACILITY): Payer: Medicare Other | Admitting: Internal Medicine

## 2015-10-01 DIAGNOSIS — R627 Adult failure to thrive: Secondary | ICD-10-CM | POA: Diagnosis not present

## 2015-10-01 DIAGNOSIS — F0390 Unspecified dementia without behavioral disturbance: Secondary | ICD-10-CM

## 2015-10-01 DIAGNOSIS — R400 Somnolence: Secondary | ICD-10-CM

## 2015-10-01 NOTE — Progress Notes (Signed)
Location:   [facility] Room Number: 305/W Place of Service:  SNF [phone]) Provider:  Henreitta Leber, MD  Patient Care Team: Hennie Duos, MD as PCP - General (Internal Medicine)  Extended Emergency Contact Information Primary Emergency Contact: Weiher,[name] Address: [address]          [place], [place] [address] Johnnette Litter of [place] Phone: [phone] Mobile Phone: [phone] Relation: Son  Code Status:  DNR Goals of care: Advanced Directive information Advanced Directives 10/01/2015  Does patient have an advance directive? Yes  Type of Paramedic of [place];Out of facility DNR (pink MOST or yellow form)  Does patient want to make changes to advanced directive? No - Patient declined  Copy of advanced directive(s) in chart? Yes     Chief Complaint  Patient presents with  . Acute Visit    Patient unresponsive     HPI:  Pt is a 82 y.o. female seen today for an acute visit for An episode of unresponsiveness-when I entered the facility patient was in her wheelchair but says is not really responding-when painful stimuli was applied she would start moving her arms however and grimace somewhat.  Patient back on June 29 was sent to the ER for similar presentation-she had been recently treated for pneumonia as well as a UTI-she also had lip edema previous to her ER visit-and was given Benadryl-in the ER was thought possibly the Benadryl had caused  the unresponsiveness-complicated as well with patient not eating and drinking very well with what appears to be gradually increasing failure to thrive issues with a history of progressive dementia.  It appears she did receive Benadryl one time this morning however apparently for agitation.  Vital signs are stable blood pressure 144/88 pulse is in the 70s she is afebrile CBG is 82 O2 saturation is 96% on room air.       Past Medical History  Diagnosis Date  . Cancer  ([place])   . Stroke ([place])   . Diabetes mellitus   . Dementia   . Hypertension   . Hyperlipidemia   . Depression   . GERD (gastroesophageal reflux disease)    History reviewed. No pertinent past surgical history.  No Known Allergies    Medication List       This list is accurate as of: 10/01/15  2:24 PM.  Always use your most recent med list.               acetaminophen 325 MG tablet  Commonly known as:  TYLENOL  Take 650 mg by mouth every 4 (four) hours as needed for mild pain.     aspirin EC 325 MG tablet  Take 325 mg by mouth daily. For CVA     bisacodyl 10 MG suppository  Commonly known as:  DULCOLAX  Place 10 mg rectally daily as needed for moderate constipation.     ENSURE  Take 1 Can by mouth 2 (two) times daily.     ferrous sulfate 325 (65 FE) MG tablet  Take 325 mg by mouth daily with breakfast. For iron deficiency     hydrALAZINE 25 MG tablet  Commonly known as:  APRESOLINE  Give 1 tablet (25 mg) by mouth along with Hydralazine 50 mg for (75 mg)  (Q6hr)     hydrALAZINE 50 MG tablet  Commonly known as:  APRESOLINE  Give 1 table (50 mg) by mouth along with hydralazine 25 mg for (75  mg)  (Q6hr)     metoprolol succinate 25 MG 24 hr tablet  Commonly known as:  TOPROL-XL  Take 25 mg by mouth 2 (two) times daily. At 0900 & 1700. Hold for HR less than 55.     MILK OF MAGNESIA PO  Take 30 mls by mouth daily PRN for mild constipation.     multivitamin with minerals Tabs tablet  Take one tablet by mouth daily     nitroGLYCERIN 0.4 MG SL tablet  Commonly known as:  NITROSTAT  Place 0.4 mg under the tongue every 5 (five) minutes as needed for chest pain. Do not exceed 3 doses     omeprazole 20 MG capsule  Commonly known as:  PRILOSEC  Take 20 mg by mouth daily before supper. For reflux     polyethylene glycol packet  Commonly known as:  MIRALAX / GLYCOLAX  Take 17 g by mouth daily. For constipation     potassium chloride SA 20 MEQ tablet  Commonly known  as:  K-DUR,KLOR-CON  Take 20 mEq by mouth daily.     senna-docusate 8.6-50 MG tablet  Commonly known as:  Senokot-S  Take 1 tablet by mouth daily. Hold for loose stools        Review of Systems   Essentially unattainable please see history of present illness  Immunization History  Administered Date(s) Administered  . PPD Test 01/12/2012  . Pneumococcal Polysaccharide-23 12/04/2013   Pertinent  Health Maintenance Due  Topic Date Due  . DEXA SCAN  03/31/2016 (Originally 07/13/1986)  . INFLUENZA VACCINE  06/27/2016 (Originally 10/30/2015)  . PNA vac Low Risk Adult (2 of 2 - PCV13) 06/27/2016 (Originally 12/05/2014)   No flowsheet data found. Functional Status Survey:    Filed Vitals:   10/01/15 1350  BP: 144/88  Pulse: 75  Temp: 98.5 F (36.9 C)  TempSrc: Oral  Resp: 16   There is no weight on file to calculate BMI. Physical Exam   In general this is a frail elderly female who appears to be somnolent however she does respond to painful stimuli moving her upper extremities and grimacing.  Her skin is warm and dry.  Eyes pupils appear reactive sclera and conjunctiva clear.  Oropharynx difficult to assess since patient would not really open her mouth I do not really see significant lipedema beyond baseline.  Chest is clear to auscultation no overt labored breathing or congestion noted.  Heart is regular rate and rhythm without murmur gallop or rub does not really appear to have significant lower extremity edema.  Her abdomen is soft does not appear to be tender there are positive bowel sounds.  Musculoskeletal does have some spontaneous movement again of her upper extremities and at times lower extremities with painful stimuli she does grimace at times but will not open her eyes or respond to verbal commands.  Neurologic as noted above I do not see any true lateralizing findings although this is certainly limited with patient's mental status at the time she does have  some spontaneous movements and facial grimacing.  Psych again she does have progressive dementia is not really speaking at this time  Labs reviewed:  Recent Labs  09/20/15 0349  09/21/15 0244 09/27/15 1858 09/27/15 1909  NA 142  --  144 137 139  K 4.0  --  3.9 4.0 4.1  CL 109  --  112* 106 104  CO2 25  --  27 23  --   GLUCOSE 232*  --  210* 74 70  BUN 32*  < > 28* 16 17  CREATININE 1.08*  1.06*  < > 1.04* 0.98 0.90  CALCIUM 9.6  --  9.6 8.8*  --   < > = values in this interval not displayed.  Recent Labs  06/20/15 09/19/15 2149 09/27/15 1858  AST 13 23 18   ALT 7 12* 14  ALKPHOS 67 70 71  BILITOT  --  0.6 0.5  PROT  --  6.1* 6.2*  ALBUMIN  --  2.8* 2.5*    Recent Labs  09/19/15 2149 09/20/15 0349 09/21/15 09/21/15 0244 09/27/15 1858 09/27/15 1909  WBC 8.3 9.5  9.4 6.9 6.9 4.1  --   NEUTROABS 7.4  --   --   --  2.7  --   HGB 10.9* 10.4*  10.5*  --  10.3* 9.8* 10.5*  HCT 33.9* 32.4*  33.0*  --  32.5* 29.5* 31.0*  MCV 84.3 84.4  84.0  --  82.7 83.1  --   PLT 205 205  212  --  236 324  --    Lab Results  Component Value Date   TSH 0.75 05/10/2015   Lab Results  Component Value Date   HGBA1C 5.2 09/21/2015   Lab Results  Component Value Date   CHOL 149 01/04/2012   HDL 67 01/04/2012   LDLCALC 70 01/04/2012   TRIG 60 01/04/2012   CHOLHDL 2.2 01/04/2012    Significant Diagnostic Results in last 30 days:  Ct Head Wo Contrast  09/27/2015  CLINICAL DATA:  82 year old female with altered mental status and unresponsive. EXAM: CT HEAD WITHOUT CONTRAST TECHNIQUE: Contiguous axial images were obtained from the base of the skull through the vertex without intravenous contrast. COMPARISON:  09/19/2015 and prior exams FINDINGS: Atrophy and severe chronic small-vessel white matter ischemic changes are again identified. No acute intracranial abnormalities are identified, including mass lesion or mass effect, hydrocephalus, extra-axial fluid collection, midline  shift, hemorrhage, or acute infarction. The visualized bony calvarium is unremarkable. IMPRESSION: No evidence of acute intracranial abnormality. Atrophy and chronic small-vessel white matter ischemic changes. Electronically Signed   By: Margarette Canada M.D.   On: 09/27/2015 20:15   Ct Head Wo Contrast  09/20/2015  CLINICAL DATA:  Initial evaluation for altered mental status, facial droop. EXAM: CT HEAD WITHOUT CONTRAST TECHNIQUE: Contiguous axial images were obtained from the base of the skull through the vertex without intravenous contrast. COMPARISON:  Prior CT from 12/02/2013. FINDINGS: Generalized cerebral atrophy similar to prior. Patchy and confluent hypodensity within the periventricular and deep white matter both cerebral hemispheres most consistent with chronic small vessel ischemic disease. Remote right frontal lobe infarct again noted. No acute intracranial hemorrhage. No acute large vessel territory infarct. No mass lesion, midline shift, or mass effect. No hydrocephalus. No extra-axial fluid collection. Scalp soft tissues demonstrate no acute abnormality. No acute abnormality about the globes and orbits. Paranasal sinuses are clear.  No mastoid effusion. Calvarium intact. IMPRESSION: 1. No acute intracranial process. 2. Remote right frontal lobe infarct, stable. 3. Generalized cerebral atrophy with moderate chronic microvascular ischemic disease, stable. Electronically Signed   By: Jeannine Boga M.D.   On: 09/20/2015 00:20   Dg Chest Port 1 View  09/27/2015  CLINICAL DATA:  Weakness.  Altered mental status. EXAM: PORTABLE CHEST 1 VIEW COMPARISON:  09/20/2015 FINDINGS: Unchanged moderate aortic tortuosity. Normal heart size. Normal pulmonary vasculature. No airspace consolidation. No large effusions. IMPRESSION: No acute findings. Electronically Signed   By: Shaune Pascal  Alroy Dust M.D.   On: 09/27/2015 22:02   Dg Chest Portable 1 View  09/20/2015  CLINICAL DATA:  Initial evaluation for acute  fever, weakness. EXAM: PORTABLE CHEST 1 VIEW COMPARISON:  Prior radiograph from 12/02/2013. FINDINGS: Cardiac and mediastinal silhouettes are stable in size and contour, and remain within normal limits. Atheromatous plaque within the aortic arch. Lungs are hypoinflated. Perihilar vascular congestion, which may be related to shallow lung inflation. There are patchy left basilar opacities, which may reflect atelectasis or infiltrates. No pulmonary edema or pleural effusion. No pneumothorax. No acute osseous abnormality. Osteopenia. Degenerative osteoarthrosis about the shoulders. Surgical clips overlie the left axilla. IMPRESSION: 1. Shallow lung inflation with mild patchy left basilar opacity, which may reflect atelectasis or infiltrate. 2. No other active cardiopulmonary disease. 3. Aortic atherosclerosis peer Electronically Signed   By: Jeannine Boga M.D.   On: 09/20/2015 02:02    Assessment/Plan  1 history of unresponsiveness-patient's vital signs appear to be stable this appears similar to previous presentation-I did discuss this with her son via phone-she does not wander sent to the hospital anymore-I did discuss her challenges with progressing dementia and poor by mouth intake and he expressed understanding stating his mother would not want any aggressive measures taken-I will write comfort care orders for no more hospitalization or IVs-emphasis on comfort care-also will write an order for a hospice consult per discussion with her son.  Addendum.  Later in the day I did reassess patient and she actually appear to be at her baseline she is alert responsive talking making eye contact appears to be essentially at her baseline I suspect this most recent episode was caused by the Benadryl administration-again I have written orders to discontinue this obviously she does not tolerate Benadryl well.  Again will await hospice input  [id number]-of note greater than 40 minutes spent assessing  patient-reassessing patient-discussing her status with nursing staff-reviewing her chart-and coordinating plan of care-of note greater than 50% of time spent coordinating plan of care with family input      Oralia Manis, [place]

## 2015-10-19 ENCOUNTER — Encounter: Payer: Self-pay | Admitting: Internal Medicine

## 2015-10-19 ENCOUNTER — Non-Acute Institutional Stay (SKILLED_NURSING_FACILITY): Payer: Medicare Other | Admitting: Internal Medicine

## 2015-10-19 DIAGNOSIS — R400 Somnolence: Secondary | ICD-10-CM | POA: Diagnosis not present

## 2015-10-19 NOTE — Progress Notes (Signed)
MRN: [id number] Name: Amy Martin  Sex: female Age: 82 y.o. DOB: 03-06-1920  [place] #:  Facility/Room: [facility]: SN[facility] Provider: Noah Delaine. Sheppard Coil, MD Emergency Contacts: Extended Emergency Contact Information Primary Emergency Contact: Stembridge,[name] Address: [address]          [place], [place] [address] Johnnette Litter of [place] Phone: [phone] Mobile Phone: [phone] Relation: Son  Code Status: DNR  Allergies: Review of patient's allergies indicates no known allergies.  Chief Complaint  Patient presents with  . Acute Visit    Acute    HPI: Patient is 82 y.o. female with HTN, CVA with hemiplegia and FTT who nursing asked me to see urgently for decreased responsiveness onset this am. Wilburn Mylar was a good day for her, today she did not wake up after being gotten out of bed as she usually does, so was put back in bed. Pt with no cough, no changes in urine, no fever, O2 sat 97% RA.  Past Medical History  Diagnosis Date  . Cancer ([place])   . Stroke ([place])   . Diabetes mellitus   . Dementia   . Hypertension   . Hyperlipidemia   . Depression   . GERD (gastroesophageal reflux disease)     No past surgical history on file.    Medication List       This list is accurate as of: 10/19/15 10:00 PM.  Always use your most recent med list.               acetaminophen 325 MG tablet  Commonly known as:  TYLENOL  Take 650 mg by mouth every 4 (four) hours as needed for mild pain.     aspirin EC 325 MG tablet  Take 325 mg by mouth daily. For CVA     bisacodyl 10 MG suppository  Commonly known as:  DULCOLAX  Place 10 mg rectally daily as needed for moderate constipation.     ENSURE  Take 1 Can by mouth 2 (two) times daily.     ferrous sulfate 325 (65 FE) MG tablet  Take 325 mg by mouth daily with breakfast. For iron deficiency     hydrALAZINE 25 MG tablet  Commonly known as:  APRESOLINE  Give 1 tablet (25 mg) by mouth along with  Hydralazine 50 mg for (75 mg)  (Q6hr)     hydrALAZINE 50 MG tablet  Commonly known as:  APRESOLINE  Give 1 table (50 mg) by mouth along with hydralazine 25 mg for (75 mg)  (Q6hr)     metoprolol succinate 25 MG 24 hr tablet  Commonly known as:  TOPROL-XL  Take 25 mg by mouth 2 (two) times daily. At 0900 & 1700. Hold for HR less than 55.     MILK OF MAGNESIA PO  Take 30 mls by mouth daily PRN for mild constipation.     multivitamin with minerals Tabs tablet  Take one tablet by mouth daily     nitroGLYCERIN 0.4 MG SL tablet  Commonly known as:  NITROSTAT  Place 0.4 mg under the tongue every 5 (five) minutes as needed for chest pain. Do not exceed 3 doses     omeprazole 20 MG capsule  Commonly known as:  PRILOSEC  Take 20 mg by mouth daily before supper. For reflux     polyethylene glycol packet  Commonly known as:  MIRALAX / GLYCOLAX  Take 17 g by mouth daily. For constipation  potassium chloride SA 20 MEQ tablet  Commonly known as:  K-DUR,KLOR-CON  Take 20 mEq by mouth daily.     senna-docusate 8.6-50 MG tablet  Commonly known as:  Senokot-S  Take 1 tablet by mouth daily. Hold for loose stools        No orders of the defined types were placed in this encounter.    Immunization History  Administered Date(s) Administered  . PPD Test 01/12/2012  . Pneumococcal Polysaccharide-23 12/04/2013    Social History  Substance Use Topics  . Smoking status: Former Smoker    Types: Cigarettes    Quit date: 01/04/1979  . Smokeless tobacco: Not on file  . Alcohol Use: No    Review of Systems  UTO 2/2 pt MS; nursing as per HPI    Filed Vitals:   10/19/15 1337  BP: 121/62  Pulse: 79  Temp: 97.3 F (36.3 C)  Resp: 16    Physical Exam  GENERAL APPEARANCE: did speak a few words that made sense with her eyes closed then after roused a little with sternal rub, No acute distress  SKIN: No diaphoresis rash HEENT: Unremarkable x pupils [place] ( but no new meds or  narcotics today) RESPIRATORY: Breathing is even, unlabored. Lung sounds are clear   CARDIOVASCULAR: Heart RRR no murmurs, rubs or gallops. No peripheral edema  GASTROINTESTINAL: Abdomen is soft, non-tender, not distended w/ normal bowel sounds.  GENITOURINARY: Bladder non tender, not distended  MUSCULOSKELETAL: No abnormal joints or musculature except very thin NEUROLOGIC: on obvious new deficits PSYCHIATRIC: unable  Patient Active Problem List   Diagnosis Date Noted  . E. coli UTI (urinary tract infection) 09/25/2015  . HCAP (healthcare-associated pneumonia) 09/25/2015  . Fever 09/20/2015  . Dehydration 09/20/2015  . Altered mental status 09/20/2015  . Dysphagia following nontraumatic intracerebral hemorrhage 07/13/2015  . Lip edema 06/18/2015  . History of CVA (cerebrovascular accident) 03/12/2015  . Cough 01/01/2015  . History of stroke 05/08/2014  . Constipation 02/27/2014  . Thrush 12/11/2013  . Protein-calorie malnutrition, severe ([place]) 12/04/2013  . Metabolic encephalopathy [id number]  . Seizure ([place]) 12/02/2013  . Esophageal reflux 07/22/2013  . CKD (chronic kidney disease) stage 3, GFR 30-59 ml/min 01/14/2013  . Anemia 11/02/2012  . Hemorrhoid 11/02/2012  . 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 Senile dementia, uncomplicated [id number]  . Iron deficiency anemia 08/13/2012  . Essential hypertension 01/06/2012  . CVA (cerebral infarction) 01/04/2012    CBC    Component Value Date/Time   WBC 4.1 09/27/2015 1858   WBC 6.9 09/21/2015   RBC 3.55* 09/27/2015 1858   HGB 10.5* 09/27/2015 1909   HCT 31.0* 09/27/2015 1909   PLT 324 09/27/2015 1858   MCV 83.1 09/27/2015 1858   LYMPHSABS 0.9 09/27/2015 1858   MONOABS 0.4 09/27/2015 1858   EOSABS 0.1 09/27/2015 1858   BASOSABS 0.0 09/27/2015 1858    CMP     Component Value Date/Time   NA 139 09/27/2015 1909   NA 142 09/19/2015   K 4.1 09/27/2015 1909   CL 104 09/27/2015 1909   CO2 23 09/27/2015  1858   GLUCOSE 70 09/27/2015 1909   BUN 17 09/27/2015 1909   BUN 28* 09/21/2015   CREATININE 0.90 09/27/2015 1909   CREATININE 1.0 09/21/2015   CALCIUM 8.8* 09/27/2015 1858   PROT 6.2* 09/27/2015 1858   ALBUMIN 2.5* 09/27/2015 1858   AST 18 09/27/2015 1858   ALT 14 09/27/2015 1858   ALKPHOS 71 09/27/2015 1858   BILITOT  0.5 09/27/2015 1858   GFRNONAA 48* 09/27/2015 1858   GFRAA 55* 09/27/2015 1858    Assessment and Plan  MS CHANGE - I spoke per phone with Darrell, pt's son and POA. He DOES NOT WANT ANY MORE HOSPITALIZATIONS. He realizes she is 51 and she is failing. Anything to treat at SNF is OK with him. I told him we would get a urine on her. I decided against labs because we would not be intervening and son was OK with that. Later, I saw Mr Cuff as he came into the facility. He told me there are times she can't sleep at night and stays up all night with the night staff. We agreed that we would follow pt with supportive and comfort care. He appears to have a good grasp of life/death and acceptance.   Time spent > 35 min;> 50% of time with patient was spent reviewing records, labs, tests and studies, counseling and developing plan of care  Noah Delaine. Sheppard Coil, MD

## 2015-10-22 ENCOUNTER — Non-Acute Institutional Stay (SKILLED_NURSING_FACILITY): Payer: Medicare Other | Admitting: Internal Medicine

## 2015-10-22 ENCOUNTER — Encounter: Payer: Self-pay | Admitting: Internal Medicine

## 2015-10-22 DIAGNOSIS — F0390 Unspecified dementia without behavioral disturbance: Secondary | ICD-10-CM

## 2015-10-22 DIAGNOSIS — R400 Somnolence: Secondary | ICD-10-CM | POA: Diagnosis not present

## 2015-10-22 DIAGNOSIS — I10 Essential (primary) hypertension: Secondary | ICD-10-CM

## 2015-10-22 DIAGNOSIS — D509 Iron deficiency anemia, unspecified: Secondary | ICD-10-CM | POA: Diagnosis not present

## 2015-10-22 NOTE — Progress Notes (Signed)
Location:   [place] Room Number: 305/W Place of Service:  SNF [phone]) Provider:  Henreitta Leber, MD  Patient Care Team: Hennie Duos, MD as PCP - General (Internal Medicine)  Extended Emergency Contact Information Primary Emergency Contact: Hawbaker,[name] Address: [address]          [place], [place] [address] Johnnette Litter of [place] Phone: [phone] Mobile Phone: [phone] Relation: Son  Code Status:  DNR Goals of care: Advanced Directive information Advanced Directives 10/22/2015  Does patient have an advance directive? Yes  Type of Paramedic of [place];Out of facility DNR (pink MOST or yellow form)  Does patient want to make changes to advanced directive? No - Patient declined  Copy of advanced directive(s) in chart? Yes  Would patient like information on creating an advanced directive? No - patient declined information     Chief Complaint  Patient presents with  . Medical Management of Chronic Issues    Routine visit    HPI:  Pt is a 82 y.o. female seen today for medical management of chronic diseases. Including dementia-hypertension-anemia-also follow-up of somnolence which patient experienced last Friday.  Patient was seen last Friday by Dr. Sheppard Coil secondary to somnolence-Dr. Sheppard Coil did discuss this with her son in the facility and he confirmed wishes for essentially comfort care with no aggressive intervention-a urinalysis and culture was ordered.  Results are pending.  Shortly thereafter apparently patient returned her baseline she is up ambulating about the facility in her wheelchair today which is her normal behavior.  Nursing staff has not noted really any changes today-her vital signs continued to be stable.  She does have a history of hypertension continues on hydralazine and Toprol-blood pressures appear to be stable recently 145/71-123/71-121/60.  In regards to dementia this  appears to be progressive-however she appears to be doing relatively well with supportive care  Regards to anemia she does continue on iron again no further labs have been ordered secondary to essentially comfort care status but clinically he appears to be stable in this regard   Past Medical History:  Diagnosis Date  . Cancer ([place])   . Dementia   . Depression   . Diabetes mellitus   . GERD (gastroesophageal reflux disease)   . Hyperlipidemia   . Hypertension   . Stroke [facility])    History reviewed. No pertinent surgical history.  No Known Allergies    Medication List       Accurate as of 10/22/15  1:36 PM. Always use your most recent med list.          acetaminophen 325 MG tablet Commonly known as:  TYLENOL Take 650 mg by mouth every 4 (four) hours as needed for mild pain.   aspirin EC 325 MG tablet Take 325 mg by mouth daily. For CVA   bisacodyl 10 MG suppository Commonly known as:  DULCOLAX Place 10 mg rectally daily as needed for moderate constipation.   ENSURE 1 can 237 mis by mouth twice a day between meals for weight maintenance.   ferrous sulfate 325 (65 FE) MG tablet Take 325 mg by mouth daily with breakfast. For iron deficiency   hydrALAZINE 25 MG tablet Commonly known as:  APRESOLINE Give 1 tablet (25 mg) by mouth along with Hydralazine 50 mg for (75 mg)  (Q6hr)   hydrALAZINE 50 MG tablet Commonly known as:  APRESOLINE Give 1 table (50 mg) by mouth along with hydralazine 25 mg for (  75 mg)  (Q6hr)   metoprolol succinate 25 MG 24 hr tablet Commonly known as:  TOPROL-XL Take 25 mg by mouth 2 (two) times daily. At 0900 & 1700. Hold for HR less than 55.   MILK OF MAGNESIA PO Take 30 mls by mouth daily PRN for mild constipation.   multivitamin with minerals Tabs tablet Take one tablet by mouth daily   nitroGLYCERIN 0.4 MG SL tablet Commonly known as:  NITROSTAT Place 0.4 mg under the tongue every 5 (five) minutes as needed for chest pain. Do not  exceed 3 doses   omeprazole 20 MG capsule Commonly known as:  PRILOSEC Take 20 mg by mouth daily before supper. For reflux   polyethylene glycol packet Commonly known as:  MIRALAX / GLYCOLAX Take 17 g by mouth daily. For constipation   potassium chloride SA 20 MEQ tablet Commonly known as:  K-DUR,KLOR-CON Take 20 mEq by mouth daily.   senna-docusate 8.6-50 MG tablet Commonly known as:  Senokot-S Take 1 tablet by mouth daily. Hold for loose stools       Review of Systems   This is not really possible secondary to dementia however she is not really complaining of any shortness of breath or pain at this time is ambulating about the facility in the wheelchair which is her baseline  Immunization History  Administered Date(s) Administered  . PPD Test 01/12/2012  . Pneumococcal Polysaccharide-23 12/04/2013   Pertinent  Health Maintenance Due  Topic Date Due  . DEXA SCAN  03/31/2016 (Originally 07/13/1986)  . INFLUENZA VACCINE  06/27/2016 (Originally 10/30/2015)  . PNA vac Low Risk Adult (2 of 2 - PCV13) 06/27/2016 (Originally 12/05/2014)   No flowsheet data found. Functional Status Survey:    Vitals:   10/22/15 1324  BP: 118/65  Pulse: 62  Resp: 20  Temp: 97.5 F (36.4 C)  TempSrc: Oral  SpO2: 95%  Weight: 94 lb 6.4 oz (42.8 kg)  Height: 5\' 4"  (1.626 m)   Body mass index is 16.2 kg/m. Physical Exam   Gen. this is a pleasant elderly female in no distress sitting comfortably in her wheelchair.  Her skin is warm and dry.  Eyes pupils appear reactive to light visual acuity appears grossly intact.  Heart is regular rate and rhythm without murmur gallop or rub she does not really have significant lower extremity edema.  Chest is clear to auscultation no labored breathing.  Abdomen soft soft nontender with positive bowel sounds.  Muscle skeletal does move all extremities 4 with arthritic changes diffuse.  Psych she is oriented to self pleasant and  cooperative does follow simple verbal commands  Labs reviewed:  Recent Labs  09/20/15 0349  09/21/15 0244 09/27/15 1858 09/27/15 1909  NA 142  --  144 137 139  K 4.0  --  3.9 4.0 4.1  CL 109  --  112* 106 104  CO2 25  --  27 23  --   GLUCOSE 232*  --  210* 74 70  BUN 32*  < > 28* 16 17  CREATININE 1.08*  1.06*  < > 1.04* 0.98 0.90  CALCIUM 9.6  --  9.6 8.8*  --   < > = values in this interval not displayed.  Recent Labs  06/20/15 09/19/15 2149 09/27/15 1858  AST 13 23 18   ALT 7 12* 14  ALKPHOS 67 70 71  BILITOT  --  0.6 0.5  PROT  --  6.1* 6.2*  ALBUMIN  --  2.8* 2.5*  Recent Labs  09/19/15 2149 09/20/15 0349 09/21/15 09/21/15 0244 09/27/15 1858 09/27/15 1909  WBC 8.3 9.5  9.4 6.9 6.9 4.1  --   NEUTROABS 7.4  --   --   --  2.7  --   HGB 10.9* 10.4*  10.5*  --  10.3* 9.8* 10.5*  HCT 33.9* 32.4*  33.0*  --  32.5* 29.5* 31.0*  MCV 84.3 84.4  84.0  --  82.7 83.1  --   PLT 205 205  212  --  236 324  --    Lab Results  Component Value Date   TSH 0.75 05/10/2015   Lab Results  Component Value Date   HGBA1C 5.2 09/21/2015   Lab Results  Component Value Date   CHOL 149 01/04/2012   HDL 67 01/04/2012   LDLCALC 70 01/04/2012   TRIG 60 01/04/2012   CHOLHDL 2.2 01/04/2012    Significant Diagnostic Results in last 30 days:  Ct Head Wo Contrast  Result Date: 09/27/2015 CLINICAL DATA:  82 year old female with altered mental status and unresponsive. EXAM: CT HEAD WITHOUT CONTRAST TECHNIQUE: Contiguous axial images were obtained from the base of the skull through the vertex without intravenous contrast. COMPARISON:  09/19/2015 and prior exams FINDINGS: Atrophy and severe chronic small-vessel white matter ischemic changes are again identified. No acute intracranial abnormalities are identified, including mass lesion or mass effect, hydrocephalus, extra-axial fluid collection, midline shift, hemorrhage, or acute infarction. The visualized bony calvarium is  unremarkable. IMPRESSION: No evidence of acute intracranial abnormality. Atrophy and chronic small-vessel white matter ischemic changes. Electronically Signed   By: Margarette Canada M.D.   On: 09/27/2015 20:15   Dg Chest Port 1 View  Result Date: 09/27/2015 CLINICAL DATA:  Weakness.  Altered mental status. EXAM: PORTABLE CHEST 1 VIEW COMPARISON:  09/20/2015 FINDINGS: Unchanged moderate aortic tortuosity. Normal heart size. Normal pulmonary vasculature. No airspace consolidation. No large effusions. IMPRESSION: No acute findings. Electronically Signed   By: Andreas Newport M.D.   On: 09/27/2015 22:02    Assessment/Plan 1 somnolence-this appears to have been transitory patient appears to be back at her baseline-talking with nursing staff apparently Thursday night she did not sleep well suspect there may have been some residual affect Friday but today she appears to be back her normal self which is encouraging again emphasis here is on comfort with no aggressive intervention a urine culture is pending  # ypertension continus on hydralazine and Toprol this appears to be relatively well controlled as noted above.  #3 dementia appears to be doing relatively well with supportive care nursing staff has not really noted any behaviors-.  #4 history of anemia iron deficiency she is on iron supplementation last hemoglobin was 10.5 on June 29-again no further labs per emphasis on comfort care nonetheless she appears to be doing well here.  Of note patient had an original somnolent episode approximately a month ago hospice consult was ordered but apparently this was not carried out I did speak with the hospice nurse today and will reorder this for consultation-see if patient is hospice eligible.  [id number]

## 2015-11-09 ENCOUNTER — Encounter: Payer: Self-pay | Admitting: Internal Medicine

## 2015-11-09 ENCOUNTER — Non-Acute Institutional Stay (SKILLED_NURSING_FACILITY): Payer: Medicare Other | Admitting: Internal Medicine

## 2015-11-09 DIAGNOSIS — D509 Iron deficiency anemia, unspecified: Secondary | ICD-10-CM

## 2015-11-09 DIAGNOSIS — R627 Adult failure to thrive: Secondary | ICD-10-CM

## 2015-11-09 DIAGNOSIS — K219 Gastro-esophageal reflux disease without esophagitis: Secondary | ICD-10-CM | POA: Diagnosis not present

## 2015-11-09 NOTE — Progress Notes (Signed)
MRN: [id number] Name: Amy Martin  Sex: female Age: 82 y.o. DOB: May 14, 1919  [place] #:  Facility/Room: [facility] [place]: SNF Provider: Noah Delaine. Sheppard Coil, MD Emergency Contacts: Extended Emergency Contact Information Primary Emergency Contact: Kudo,[name] Address: [address]          [place], [place] [address] Johnnette Litter of [place] Phone: [phone] Mobile Phone: [phone] Relation: Son  Code Status:  DNR  Allergies: Review of patient's allergies indicates no known allergies.  Chief Complaint  Patient presents with  . Medical Management of Chronic Issues    Routine Visit    HPI: Patient is 82 y.o. female who is being seen for FTT, GERD and iron def anemia.  Past Medical History:  Diagnosis Date  . Cancer ([place])   . Dementia   . Depression   . Diabetes mellitus   . GERD (gastroesophageal reflux disease)   . Hyperlipidemia   . Hypertension   . Stroke [facility])     No past surgical history on file.    Medication List       Accurate as of 11/09/15 11:59 PM. Always use your most recent med list.          acetaminophen 325 MG tablet Commonly known as:  TYLENOL Take 650 mg by mouth every 4 (four) hours as needed for mild pain.   aspirin EC 325 MG tablet Take 325 mg by mouth daily. For CVA   bisacodyl 10 MG suppository Commonly known as:  DULCOLAX Place 10 mg rectally daily as needed for moderate constipation.   ENSURE 1 can 237 mis by mouth twice a day between meals for weight maintenance.   ferrous sulfate 325 (65 FE) MG tablet Take 325 mg by mouth daily with breakfast. For iron deficiency   hydrALAZINE 25 MG tablet Commonly known as:  APRESOLINE Give 1 tablet (25 mg) by mouth along with Hydralazine 50 mg for (75 mg)  (Q6hr)   hydrALAZINE 50 MG tablet Commonly known as:  APRESOLINE Give 1 table (50 mg) by mouth along with hydralazine 25 mg for (75 mg)  (Q6hr)   metoprolol succinate 25 MG 24 hr tablet Commonly known as:   TOPROL-XL Take 25 mg by mouth 2 (two) times daily. At 0900 & 1700. Hold for HR less than 55.   MILK OF MAGNESIA PO Take 30 mls by mouth daily PRN for mild constipation.   multivitamin with minerals Tabs tablet Take one tablet by mouth daily   nitroGLYCERIN 0.4 MG SL tablet Commonly known as:  NITROSTAT Place 0.4 mg under the tongue every 5 (five) minutes as needed for chest pain. Do not exceed 3 doses   omeprazole 20 MG capsule Commonly known as:  PRILOSEC Take 20 mg by mouth daily before supper. For reflux   polyethylene glycol packet Commonly known as:  MIRALAX / GLYCOLAX Take 17 g by mouth daily. For constipation   potassium chloride SA 20 MEQ tablet Commonly known as:  K-DUR,KLOR-CON Take 20 mEq by mouth daily.   senna-docusate 8.6-50 MG tablet Commonly known as:  Senokot-S Take 1 tablet by mouth daily. Hold for loose stools       No orders of the defined types were placed in this encounter.   Immunization History  Administered Date(s) Administered  . PPD Test 01/12/2012  . Pneumococcal Polysaccharide-23 12/04/2013    Social History  Substance Use Topics  . Smoking status: Former Smoker    Types: Cigarettes    Quit date:  01/04/1979  . Smokeless tobacco: Never Used  . Alcohol use No    Review of Systems  DATA OBTAINED: from patient- can't fully participate;nursing- no new or acute problems GENERAL:  no fevers, fatigue, appetite changes SKIN: No itching, rash HEENT: No complaint RESPIRATORY: No cough, wheezing, SOB CARDIAC: No chest pain, palpitations, lower extremity edema  GI: No abdominal pain, No N/V/D or constipation, No heartburn or reflux  GU: No dysuria, frequency or urgency, or incontinence  MUSCULOSKELETAL: No unrelieved bone/joint pain NEUROLOGIC: No headache, dizziness  PSYCHIATRIC: No overt anxiety or sadness  Vitals:   11/09/15 0841  BP: 121/62  Pulse: 67  Resp: 16  Temp: 97.8 F (36.6 C)    Physical Exam  GENERAL APPEARANCE:  Alert, min conversant, No acute distress in her WC in the hall  SKIN: No diaphoresis rash HEENT: Unremarkable RESPIRATORY: Breathing is even, unlabored. Lung sounds are clear   CARDIOVASCULAR: Heart RRR no murmurs, rubs or gallops. No peripheral edema  GASTROINTESTINAL: Abdomen is soft, non-tender, not distended w/ normal bowel sounds.  GENITOURINARY: Bladder non tender, not distended  MUSCULOSKELETAL: No abnormal joints or musculature NEUROLOGIC: Cranial nerves 2-12 grossly intact. Moves all extremities PSYCHIATRIC:increasing dementia, no behavioral issues  Patient Active Problem List   Diagnosis Date Noted  . FTT (failure to thrive) in adult 11/25/2015  . E. coli UTI (urinary tract infection) 09/25/2015  . HCAP (healthcare-associated pneumonia) 09/25/2015  . Fever 09/20/2015  . Dehydration 09/20/2015  . Altered mental status 09/20/2015  . Dysphagia following nontraumatic intracerebral hemorrhage 07/13/2015  . Lip edema 06/18/2015  . History of CVA (cerebrovascular accident) 03/12/2015  . Cough 01/01/2015  . History of stroke 05/08/2014  . Constipation 02/27/2014  . Thrush 12/11/2013  . Protein-calorie malnutrition, severe ([place]) 12/04/2013  . Metabolic encephalopathy 99991111  . Seizure ([place]) 12/02/2013  . Esophageal reflux 07/22/2013  . CKD (chronic kidney disease) stage 3, GFR 30-59 ml/min 01/14/2013  . Anemia 11/02/2012  . Hemorrhoid 11/02/2012  . 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 Senile dementia, uncomplicated [id number]  . Iron deficiency anemia 08/13/2012  . Essential hypertension 01/06/2012  . CVA (cerebral infarction) 01/04/2012    CBC    Component Value Date/Time   WBC 4.1 09/27/2015 1858   RBC 3.55 (L) 09/27/2015 1858   HGB 10.5 (L) 09/27/2015 1909   HCT 31.0 (L) 09/27/2015 1909   PLT 324 09/27/2015 1858   MCV 83.1 09/27/2015 1858   LYMPHSABS 0.9 09/27/2015 1858   MONOABS 0.4 09/27/2015 1858   EOSABS 0.1 09/27/2015 1858   BASOSABS  0.0 09/27/2015 1858    CMP     Component Value Date/Time   NA 139 09/27/2015 1909   NA 142 09/19/2015   K 4.1 09/27/2015 1909   CL 104 09/27/2015 1909   CO2 23 09/27/2015 1858   GLUCOSE 70 09/27/2015 1909   BUN 17 09/27/2015 1909   BUN 28 (A) 09/21/2015   CREATININE 0.90 09/27/2015 1909   CALCIUM 8.8 (L) 09/27/2015 1858   PROT 6.2 (L) 09/27/2015 1858   ALBUMIN 2.5 (L) 09/27/2015 1858   AST 18 09/27/2015 1858   ALT 14 09/27/2015 1858   ALKPHOS 71 09/27/2015 1858   BILITOT 0.5 09/27/2015 1858   GFRNONAA 48 (L) 09/27/2015 1858   GFRAA 55 (L) 09/27/2015 1858    Assessment and Plan  FTT (failure to thrive) in adult I believe that pt has begun a slow decline. She has good days ,then bad days. In general not eating and drinking  as much but I still see her in the halls most days traveling around in her [facility] so she is getting decent nutrition. She is supportive and comfort care only, per her wishes and per her son's wishes.  Esophageal reflux No reported episodes of reflux or aspiration.plan to cont prilosec 20 mg daily  Iron deficiency anemia Last HB in June 9.8; pt is receiving iron 325 mg daily; will follow at intervals although I doubt family would want transfusions   Noah Delaine. Sheppard Coil, MD

## 2015-11-25 ENCOUNTER — Encounter: Payer: Self-pay | Admitting: Internal Medicine

## 2015-11-25 DIAGNOSIS — R627 Adult failure to thrive: Secondary | ICD-10-CM | POA: Insufficient documentation

## 2015-11-25 NOTE — Assessment & Plan Note (Signed)
I believe that pt has begun a slow decline. She has good days ,then bad days. In general not eating and drinking as much but I still see her in the halls most days traveling around in her [facility] so she is getting decent nutrition. She is supportive and comfort care only, per her wishes and per her son's wishes.

## 2015-11-25 NOTE — Assessment & Plan Note (Signed)
Last HB in June 9.8; pt is receiving iron 325 mg daily; will follow at intervals although I doubt family would want transfusions

## 2015-11-25 NOTE — Assessment & Plan Note (Signed)
No reported episodes of reflux or aspiration.plan to cont prilosec 20 mg daily

## 2015-12-12 ENCOUNTER — Non-Acute Institutional Stay (SKILLED_NURSING_FACILITY): Payer: Medicare Other | Admitting: Internal Medicine

## 2015-12-12 ENCOUNTER — Encounter: Payer: Self-pay | Admitting: Internal Medicine

## 2015-12-12 DIAGNOSIS — N183 Chronic kidney disease, stage 3 unspecified: Secondary | ICD-10-CM

## 2015-12-12 DIAGNOSIS — I10 Essential (primary) hypertension: Secondary | ICD-10-CM | POA: Diagnosis not present

## 2015-12-12 DIAGNOSIS — F0390 Unspecified dementia without behavioral disturbance: Secondary | ICD-10-CM

## 2015-12-12 NOTE — Progress Notes (Signed)
MRN: [id number] Name: Amy Martin  Sex: female Age: 82 y.o. DOB: May 18, 1919  [place] #:  Facility/Room: [facility] [place]: SNF Provider: Noah Delaine. Sheppard Coil, MD Emergency Contacts: Extended Emergency Contact Information Primary Emergency Contact: [name],[name] Address: [address]          [place], [place] [address] Johnnette Litter of [place] Phone: [phone] Mobile Phone: [phone] Relation: Son  Code Status: DNR  Allergies: Review of patient's allergies indicates no known allergies.  Chief Complaint  Patient presents with  . Medical Management of Chronic Issues    Routine Visit    HPI: Patient is 82 y.o. female who is being seen for routine issues of dementia, HTN and CKD3.  Past Medical History:  Diagnosis Date  . Cancer ([place])   . Dementia   . Depression   . Diabetes mellitus   . GERD (gastroesophageal reflux disease)   . Hyperlipidemia   . Hypertension   . Stroke [facility])     No past surgical history on file.    Medication List       Accurate as of 12/12/15 11:59 PM. Always use your most recent med list.          acetaminophen 325 MG tablet Commonly known as:  TYLENOL Take 650 mg by mouth every 4 (four) hours as needed for mild pain.   aspirin EC 325 MG tablet Take 325 mg by mouth daily. For CVA   bisacodyl 10 MG suppository Commonly known as:  DULCOLAX Place 10 mg rectally daily as needed for moderate constipation.   ENSURE 1 can 237 mis by mouth twice a day between meals for weight maintenance.   ferrous sulfate 325 (65 FE) MG tablet Take 325 mg by mouth daily with breakfast. For iron deficiency   fluconazole 100 MG tablet Commonly known as:  DIFLUCAN Take 100 mg by mouth daily.   hydrALAZINE 25 MG tablet Commonly known as:  APRESOLINE Give 1 tablet (25 mg) by mouth along with Hydralazine 50 mg for (75 mg)  (Q6hr)   hydrALAZINE 50 MG tablet Commonly known as:  APRESOLINE Give 1 table (50 mg) by mouth along with  hydralazine 25 mg for (75 mg)  (Q6hr)   metoprolol succinate 25 MG 24 hr tablet Commonly known as:  TOPROL-XL Take 25 mg by mouth 2 (two) times daily. At 0900 & 1700. Hold for HR less than 55.   MILK OF MAGNESIA PO Take 30 mls by mouth daily PRN for mild constipation.   multivitamin with minerals Tabs tablet Take one tablet by mouth daily   nitroGLYCERIN 0.4 MG SL tablet Commonly known as:  NITROSTAT Place 0.4 mg under the tongue every 5 (five) minutes as needed for chest pain. Do not exceed 3 doses   omeprazole 20 MG capsule Commonly known as:  PRILOSEC Take 20 mg by mouth daily before supper. For reflux   polyethylene glycol packet Commonly known as:  MIRALAX / GLYCOLAX Take 17 g by mouth daily. For constipation   potassium chloride SA 20 MEQ tablet Commonly known as:  K-DUR,KLOR-CON Take 20 mEq by mouth daily.   senna-docusate 8.6-50 MG tablet Commonly known as:  Senokot-S Take 1 tablet by mouth daily. Hold for loose stools       No orders of the defined types were placed in this encounter.   Immunization History  Administered Date(s) Administered  . PPD Test 01/12/2012  . Pneumococcal Polysaccharide-23 12/04/2013    Social History  Substance Use  Topics  . Smoking status: Former Smoker    Types: Cigarettes    Quit date: 01/04/1979  . Smokeless tobacco: Never Used  . Alcohol use No    Review of Systems  UTO 2/2 dementia    Vitals:   12/12/15 1053  BP: 121/62  Pulse: 65  Resp: 17  Temp: 97.1 F (36.2 C)    Physical Exam  GENERAL APPEARANCE: Alert, min conversant, No acute distress  SKIN: No diaphoresis rash HEENT: Unremarkable RESPIRATORY: Breathing is even, unlabored. Lung sounds are clear   CARDIOVASCULAR: Heart RRR no murmurs, rubs or gallops. No peripheral edema  GASTROINTESTINAL: Abdomen is soft, non-tender, not distended w/ normal bowel sounds.  GENITOURINARY: Bladder non tender, not distended  MUSCULOSKELETAL: No abnormal joints or  musculature NEUROLOGIC: Cranial nerves 2-12 grossly intact. Moves all extremities PSYCHIATRIC: dementia, no behavioral issues  Patient Active Problem List   Diagnosis Date Noted  . FTT (failure to thrive) in adult 11/25/2015  . E. coli UTI (urinary tract infection) 09/25/2015  . HCAP (healthcare-associated pneumonia) 09/25/2015  . Fever 09/20/2015  . Dehydration 09/20/2015  . Altered mental status 09/20/2015  . Dysphagia following nontraumatic intracerebral hemorrhage 07/13/2015  . Lip edema 06/18/2015  . History of CVA (cerebrovascular accident) 03/12/2015  . Cough 01/01/2015  . History of stroke 05/08/2014  . Constipation 02/27/2014  . Thrush 12/11/2013  . Protein-calorie malnutrition, severe ([place]) 12/04/2013  . Metabolic encephalopathy 99991111  . Seizure ([place]) 12/02/2013  . Esophageal reflux 07/22/2013  . CKD (chronic kidney disease) stage 3, GFR 30-59 ml/min 01/14/2013  . Anemia 11/02/2012  . Hemorrhoid 11/02/2012  . 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 Senile dementia, uncomplicated [id number]  . Iron deficiency anemia 08/13/2012  . Essential hypertension 01/06/2012  . CVA (cerebral infarction) 01/04/2012    CBC    Component Value Date/Time   WBC 4.1 09/27/2015 1858   RBC 3.55 (L) 09/27/2015 1858   HGB 10.5 (L) 09/27/2015 1909   HCT 31.0 (L) 09/27/2015 1909   PLT 324 09/27/2015 1858   MCV 83.1 09/27/2015 1858   LYMPHSABS 0.9 09/27/2015 1858   MONOABS 0.4 09/27/2015 1858   EOSABS 0.1 09/27/2015 1858   BASOSABS 0.0 09/27/2015 1858    CMP     Component Value Date/Time   NA 139 09/27/2015 1909   NA 142 09/19/2015   K 4.1 09/27/2015 1909   CL 104 09/27/2015 1909   CO2 23 09/27/2015 1858   GLUCOSE 70 09/27/2015 1909   BUN 17 09/27/2015 1909   BUN 28 (A) 09/21/2015   CREATININE 0.90 09/27/2015 1909   CALCIUM 8.8 (L) 09/27/2015 1858   PROT 6.2 (L) 09/27/2015 1858   ALBUMIN 2.5 (L) 09/27/2015 1858   AST 18 09/27/2015 1858   ALT 14  09/27/2015 1858   ALKPHOS 71 09/27/2015 1858   BILITOT 0.5 09/27/2015 1858   GFRNONAA 48 (L) 09/27/2015 1858   GFRAA 55 (L) 09/27/2015 1858    Assessment and Plan  Senile dementia, uncomplicated Pt is in a slow and steady decline; she is drinking and eating less; she still gets out in her WC into halls daily; will cont to monitor;i know son wants comfort care but we don't have an official DNR and need to get it SW has it on her list and so do I  Essential hypertension Controlled;plan to cont hydralazine 75 mg q6, and toprol XL 25 mg daily  CKD (chronic kidney disease) stage 3, GFR 30-59 ml/min In 08/2015 GFR 55, BUN/Cr 16/0.98;  stable; will monitor at intervals   Webb Silversmith D. Sheppard Coil, MD

## 2015-12-14 ENCOUNTER — Encounter: Payer: Self-pay | Admitting: Internal Medicine

## 2015-12-14 NOTE — Assessment & Plan Note (Signed)
In 08/2015 GFR 55, BUN/Cr 16/0.98; stable; will monitor at intervals

## 2015-12-14 NOTE — Assessment & Plan Note (Signed)
Controlled;plan to cont hydralazine 75 mg q6, and toprol XL 25 mg daily

## 2015-12-14 NOTE — Assessment & Plan Note (Signed)
Pt is in a slow and steady decline; she is drinking and eating less; she still gets out in her WC into halls daily; will cont to monitor;i know son wants comfort care but we don't have an official DNR and need to get it SW has it on her list and so do I

## 2015-12-18 ENCOUNTER — Encounter: Payer: Self-pay | Admitting: Internal Medicine

## 2015-12-18 ENCOUNTER — Non-Acute Institutional Stay (SKILLED_NURSING_FACILITY): Payer: Medicare Other | Admitting: Internal Medicine

## 2015-12-18 DIAGNOSIS — G3183 Dementia with Lewy bodies: Secondary | ICD-10-CM | POA: Diagnosis not present

## 2015-12-18 DIAGNOSIS — R627 Adult failure to thrive: Secondary | ICD-10-CM | POA: Diagnosis not present

## 2015-12-18 DIAGNOSIS — F0280 Dementia in other diseases classified elsewhere without behavioral disturbance: Secondary | ICD-10-CM | POA: Diagnosis not present

## 2015-12-18 NOTE — Progress Notes (Signed)
MRN: [id number] Name: Amy Martin  Sex: female Age: 82 y.o. DOB: 09/20/1919  [place] #:  Facility/Room: [facility]: [facility] Provider: Noah Delaine. Sheppard Coil, MD Emergency Contacts: Extended Emergency Contact Information Primary Emergency Contact: Jani,[name] Address: [address]          [place], [place] [address] Johnnette Litter of [place] Phone: [phone] Mobile Phone: [phone] Relation: Son   Code Status: DNR  Allergies: Review of patient's allergies indicates no known allergies.  Chief Complaint  Patient presents with  . Acute Visit    Acute    HPI: Patient is 82 y.o. female who has not been eating or drinking well lately, but still is in the hall daily who nursing asked me to see because she has had an acute change in condition. Pt is barely arousable, but no working to breathe ot signs of discomfort.  Past Medical History:  Diagnosis Date  . Cancer ([place])   . Dementia   . Depression   . Diabetes mellitus   . GERD (gastroesophageal reflux disease)   . Hyperlipidemia   . Hypertension   . Stroke [facility])     No past surgical history on file.    Medication List       Accurate as of  12:17 PM. Always use your most recent med list.          acetaminophen 325 MG tablet Commonly known as:  TYLENOL Take 650 mg by mouth every 4 (four) hours as needed for mild pain.   aspirin EC 325 MG tablet Take 325 mg by mouth daily. For CVA   bisacodyl 10 MG suppository Commonly known as:  DULCOLAX Place 10 mg rectally daily as needed for moderate constipation.   ENSURE 1 can 237 mis by mouth twice a day between meals for weight maintenance.   ferrous sulfate 325 (65 FE) MG tablet Take 325 mg by mouth daily with breakfast. For iron deficiency   fluconazole 100 MG tablet Commonly known as:  DIFLUCAN Take 100 mg by mouth daily.   hydrALAZINE 25 MG tablet Commonly known as:  APRESOLINE Give 1 tablet (25 mg) by mouth along with Hydralazine  50 mg for (75 mg)  (Q6hr)   hydrALAZINE 50 MG tablet Commonly known as:  APRESOLINE Give 1 table (50 mg) by mouth along with hydralazine 25 mg for (75 mg)  (Q6hr)   metoprolol succinate 25 MG 24 hr tablet Commonly known as:  TOPROL-XL Take 25 mg by mouth 2 (two) times daily. At 0900 & 1700. Hold for HR less than 55.   MILK OF MAGNESIA PO Take 30 mls by mouth daily PRN for mild constipation.   multivitamin with minerals Tabs tablet Take one tablet by mouth daily   nitroGLYCERIN 0.4 MG SL tablet Commonly known as:  NITROSTAT Place 0.4 mg under the tongue every 5 (five) minutes as needed for chest pain. Do not exceed 3 doses   omeprazole 20 MG capsule Commonly known as:  PRILOSEC Take 20 mg by mouth daily before supper. For reflux   polyethylene glycol packet Commonly known as:  MIRALAX / GLYCOLAX Take 17 g by mouth daily. For constipation   potassium chloride SA 20 MEQ tablet Commonly known as:  K-DUR,KLOR-CON Take 20 mEq by mouth daily.   senna-docusate 8.6-50 MG tablet Commonly known as:  Senokot-S Take 1 tablet by mouth daily. Hold for loose stools       No orders of the defined types were placed  in this encounter.   Immunization History  Administered Date(s) Administered  . PPD Test 01/12/2012  . Pneumococcal Polysaccharide-23 12/04/2013    Social History  Substance Use Topics  . Smoking status: Former Smoker    Types: Cigarettes    Quit date: 01/04/1979  . Smokeless tobacco: Never Used  . Alcohol use No    Review of Systems  UTO 2/2 dementia and current stae; nursing as per HPI    Vitals:    1210  BP: 105/62  Pulse: (!) 110  Resp: 16  Temp: 98.2 F (36.8 C)    Physical Exam  GENERAL APPEARANCE: obtunded, nonconversant, No acute distress  SKIN: No diaphoresis rash HEENT: Unremarkable RESPIRATORY: Breathing is even, unlabored. Lung sounds are clear   CARDIOVASCULAR: Heart RRR no murmurs, rubs or gallops. No peripheral edema   GASTROINTESTINAL: Abdomen is soft, non-tender, not distended w/ normal bowel sounds.  GENITOURINARY: Bladder non tender, not distended  MUSCULOSKELETAL: No abnormal joints or musculature NEUROLOGIC: pt obtunded, no signs of one sided weakness suggesting CVA PSYCHIATRIC: n/a  Patient Active Problem List   Diagnosis Date Noted  . FTT (failure to thrive) in adult 11/25/2015  . E. coli UTI (urinary tract infection) 09/25/2015  . HCAP (healthcare-associated pneumonia) 09/25/2015  . Fever 09/20/2015  . Dehydration 09/20/2015  . Altered mental status 09/20/2015  . Dysphagia following nontraumatic intracerebral hemorrhage 07/13/2015  . Lip edema 06/18/2015  . History of CVA (cerebrovascular accident) 03/12/2015  . Cough 01/01/2015  . History of stroke 05/08/2014  . Constipation 02/27/2014  . Thrush 12/11/2013  . Protein-calorie malnutrition, severe ([place]) 12/04/2013  . Metabolic encephalopathy 99991111  . Seizure ([place]) 12/02/2013  . Esophageal reflux 07/22/2013  . CKD (chronic kidney disease) stage 3, GFR 30-59 ml/min 01/14/2013  . Anemia 11/02/2012  . Hemorrhoid 11/02/2012  . 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 Senile dementia, uncomplicated [id number]  . Iron deficiency anemia 08/13/2012  . Essential hypertension 01/06/2012  . CVA (cerebral infarction) 01/04/2012    CBC    Component Value Date/Time   WBC 4.1 09/27/2015 1858   RBC 3.55 (L) 09/27/2015 1858   HGB 10.5 (L) 09/27/2015 1909   HCT 31.0 (L) 09/27/2015 1909   PLT 324 09/27/2015 1858   MCV 83.1 09/27/2015 1858   LYMPHSABS 0.9 09/27/2015 1858   MONOABS 0.4 09/27/2015 1858   EOSABS 0.1 09/27/2015 1858   BASOSABS 0.0 09/27/2015 1858    CMP     Component Value Date/Time   NA 139 09/27/2015 1909   NA 142 09/19/2015   K 4.1 09/27/2015 1909   CL 104 09/27/2015 1909   CO2 23 09/27/2015 1858   GLUCOSE 70 09/27/2015 1909   BUN 17 09/27/2015 1909   BUN 28 (A) 09/21/2015   CREATININE 0.90  09/27/2015 1909   CALCIUM 8.8 (L) 09/27/2015 1858   PROT 6.2 (L) 09/27/2015 1858   ALBUMIN 2.5 (L) 09/27/2015 1858   AST 18 09/27/2015 1858   ALT 14 09/27/2015 1858   ALKPHOS 71 09/27/2015 1858   BILITOT 0.5 09/27/2015 1858   GFRNONAA 48 (L) 09/27/2015 1858   GFRAA 55 (L) 09/27/2015 1858    Assessment and Plan  FTT/ DEMENTIA- this has been coming on since her last hospitalization. She does indeed look like she is actively dying; she has done this once before and came out of it but this time I don't think so. I have written for glycerine swabs for mouth care, ordered O2 for comfort and morphine for anxiety  or resp work ( I don't think she will live long enough for this) and made pt NPO; I spoke with pt's son who is here in facility- he understands and has always been good to work with, agrees to comfort care; pt is DNR  Time spent > 35 min Anne D. Sheppard Coil, MD

## 2015-12-30 DEATH — deceased

## 2016-08-22 IMAGING — DX DG CHEST 1V PORT
1 series · 1 of 1 positions shown · non-contrast
Comparison: 09/20/2015

CLINICAL DATA: Weakness.  Altered mental status.

EXAM:
PORTABLE CHEST 1 VIEW

[chest ap]
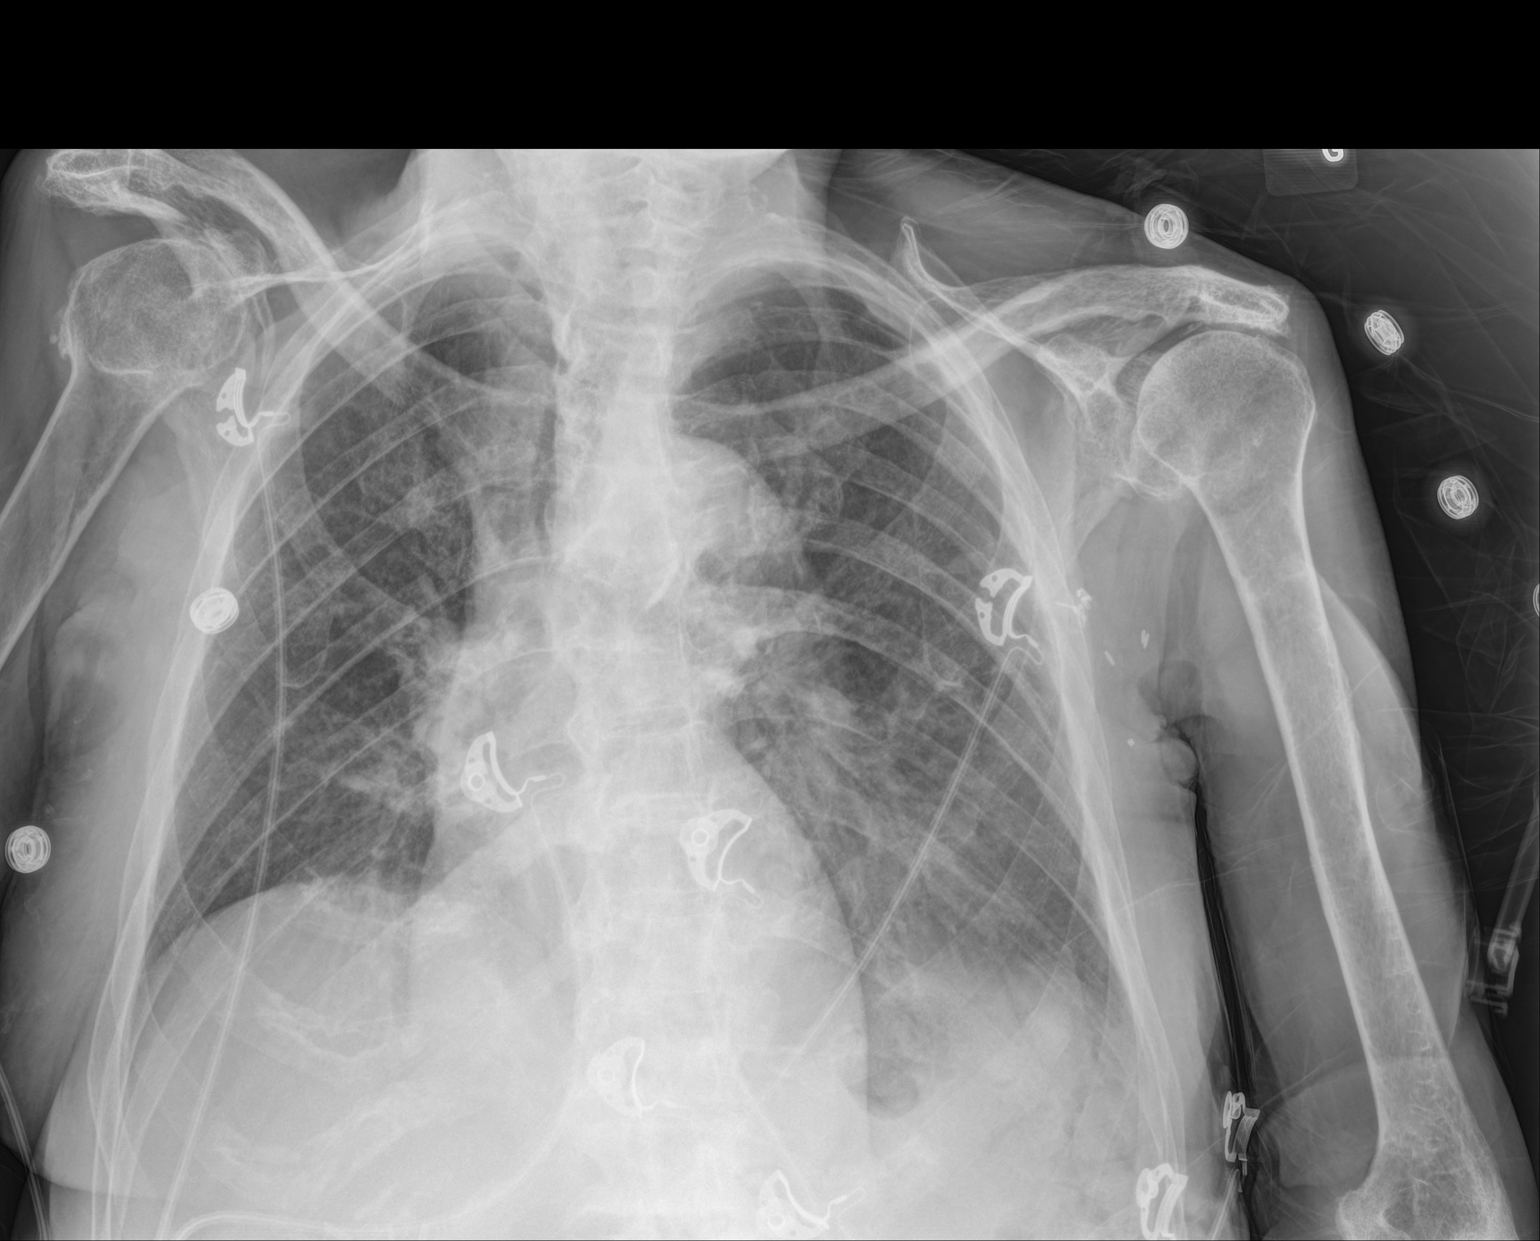

[1 of 1 positions shown; findings below may reference images not displayed]

FINDINGS: Unchanged moderate aortic tortuosity. Normal heart size. Normal
pulmonary vasculature. No airspace consolidation. No large
effusions.
IMPRESSION: No acute findings.
